# Patient Record
Sex: Male | Born: 1935
Health system: Southern US, Community
[De-identification: ages and names within clinical notes are randomized; demographics above are authoritative.]

## PROBLEM LIST (undated history)

## (undated) DIAGNOSIS — K589 Irritable bowel syndrome without diarrhea: Secondary | ICD-10-CM

## (undated) DIAGNOSIS — Z973 Presence of spectacles and contact lenses: Secondary | ICD-10-CM

## (undated) DIAGNOSIS — Z8679 Personal history of other diseases of the circulatory system: Secondary | ICD-10-CM

## (undated) DIAGNOSIS — Q600 Renal agenesis, unilateral: Secondary | ICD-10-CM

## (undated) DIAGNOSIS — M8580 Other specified disorders of bone density and structure, unspecified site: Secondary | ICD-10-CM

## (undated) DIAGNOSIS — Z923 Personal history of irradiation: Secondary | ICD-10-CM

## (undated) DIAGNOSIS — R300 Dysuria: Secondary | ICD-10-CM

## (undated) DIAGNOSIS — M1990 Unspecified osteoarthritis, unspecified site: Secondary | ICD-10-CM

## (undated) DIAGNOSIS — Z87438 Personal history of other diseases of male genital organs: Secondary | ICD-10-CM

## (undated) DIAGNOSIS — Z9889 Other specified postprocedural states: Secondary | ICD-10-CM

## (undated) DIAGNOSIS — E785 Hyperlipidemia, unspecified: Secondary | ICD-10-CM

## (undated) DIAGNOSIS — I10 Essential (primary) hypertension: Secondary | ICD-10-CM

## (undated) DIAGNOSIS — C4490 Unspecified malignant neoplasm of skin, unspecified: Secondary | ICD-10-CM

## (undated) DIAGNOSIS — Z85828 Personal history of other malignant neoplasm of skin: Secondary | ICD-10-CM

## (undated) DIAGNOSIS — C679 Malignant neoplasm of bladder, unspecified: Secondary | ICD-10-CM

## (undated) DIAGNOSIS — K219 Gastro-esophageal reflux disease without esophagitis: Secondary | ICD-10-CM

## (undated) DIAGNOSIS — Z9221 Personal history of antineoplastic chemotherapy: Secondary | ICD-10-CM

## (undated) DIAGNOSIS — IMO0002 Reserved for concepts with insufficient information to code with codable children: Secondary | ICD-10-CM

## (undated) DIAGNOSIS — Z87442 Personal history of urinary calculi: Secondary | ICD-10-CM

## (undated) DIAGNOSIS — Z859 Personal history of malignant neoplasm, unspecified: Secondary | ICD-10-CM

## (undated) HISTORY — PX: OTHER SURGICAL HISTORY: SHX169

## (undated) HISTORY — DX: Gastro-esophageal reflux disease without esophagitis: K21.9

## (undated) HISTORY — DX: Hyperlipidemia, unspecified: E78.5

## (undated) HISTORY — DX: Irritable bowel syndrome, unspecified: K58.9

---

## 2000-08-08 ENCOUNTER — Encounter: Payer: Self-pay | Admitting: Family Medicine

## 2000-08-08 ENCOUNTER — Encounter: Admission: RE | Admit: 2000-08-08 | Discharge: 2000-08-08 | Payer: Self-pay | Admitting: Family Medicine

## 2002-09-09 ENCOUNTER — Emergency Department (HOSPITAL_COMMUNITY): Admission: EM | Admit: 2002-09-09 | Discharge: 2002-09-09 | Payer: Self-pay

## 2002-09-13 ENCOUNTER — Emergency Department (HOSPITAL_COMMUNITY): Admission: EM | Admit: 2002-09-13 | Discharge: 2002-09-13 | Payer: Self-pay | Admitting: Emergency Medicine

## 2002-12-05 ENCOUNTER — Encounter (INDEPENDENT_AMBULATORY_CARE_PROVIDER_SITE_OTHER): Payer: Self-pay

## 2002-12-05 ENCOUNTER — Ambulatory Visit (HOSPITAL_COMMUNITY): Admission: RE | Admit: 2002-12-05 | Discharge: 2002-12-05 | Payer: Self-pay | Admitting: Gastroenterology

## 2005-05-30 ENCOUNTER — Ambulatory Visit (HOSPITAL_COMMUNITY): Admission: RE | Admit: 2005-05-30 | Discharge: 2005-05-30 | Payer: Self-pay | Admitting: General Surgery

## 2005-05-30 ENCOUNTER — Encounter (INDEPENDENT_AMBULATORY_CARE_PROVIDER_SITE_OTHER): Payer: Self-pay | Admitting: Specialist

## 2005-05-30 HISTORY — PX: HEMORROIDECTOMY: SUR656

## 2005-05-31 ENCOUNTER — Emergency Department (HOSPITAL_COMMUNITY): Admission: EM | Admit: 2005-05-31 | Discharge: 2005-05-31 | Payer: Self-pay | Admitting: Emergency Medicine

## 2006-04-24 ENCOUNTER — Ambulatory Visit (HOSPITAL_COMMUNITY): Admission: RE | Admit: 2006-04-24 | Discharge: 2006-04-24 | Payer: Self-pay | Admitting: General Surgery

## 2007-03-27 ENCOUNTER — Encounter: Admission: RE | Admit: 2007-03-27 | Discharge: 2007-03-27 | Payer: Self-pay | Admitting: Family Medicine

## 2008-04-04 ENCOUNTER — Ambulatory Visit (HOSPITAL_BASED_OUTPATIENT_CLINIC_OR_DEPARTMENT_OTHER): Admission: RE | Admit: 2008-04-04 | Discharge: 2008-04-04 | Payer: Self-pay | Admitting: Urology

## 2009-03-18 ENCOUNTER — Encounter: Admission: RE | Admit: 2009-03-18 | Discharge: 2009-03-18 | Payer: Self-pay | Admitting: Family Medicine

## 2009-04-16 ENCOUNTER — Ambulatory Visit (HOSPITAL_COMMUNITY): Admission: RE | Admit: 2009-04-16 | Discharge: 2009-04-16 | Payer: Self-pay | Admitting: Gastroenterology

## 2009-09-02 ENCOUNTER — Ambulatory Visit: Payer: Self-pay | Admitting: Sports Medicine

## 2009-09-02 DIAGNOSIS — M216X9 Other acquired deformities of unspecified foot: Secondary | ICD-10-CM

## 2009-09-02 DIAGNOSIS — M629 Disorder of muscle, unspecified: Secondary | ICD-10-CM

## 2010-02-09 ENCOUNTER — Ambulatory Visit: Payer: Self-pay | Admitting: Sports Medicine

## 2010-02-09 ENCOUNTER — Encounter: Admission: RE | Admit: 2010-02-09 | Discharge: 2010-02-09 | Payer: Self-pay | Admitting: Sports Medicine

## 2010-02-09 DIAGNOSIS — M25559 Pain in unspecified hip: Secondary | ICD-10-CM

## 2010-03-03 ENCOUNTER — Ambulatory Visit: Payer: Self-pay | Admitting: Sports Medicine

## 2010-03-03 DIAGNOSIS — R269 Unspecified abnormalities of gait and mobility: Secondary | ICD-10-CM | POA: Insufficient documentation

## 2010-05-03 ENCOUNTER — Ambulatory Visit
Admission: RE | Admit: 2010-05-03 | Discharge: 2010-05-03 | Payer: Self-pay | Source: Home / Self Care | Attending: Sports Medicine | Admitting: Sports Medicine

## 2010-05-27 NOTE — Assessment & Plan Note (Signed)
Summary: ORTHOTICS/LP   Vital Signs:  Patient profile:   77 year old male BP sitting:   146 / 81  History of Present Illness: 77 yo M f/u R hip pain.  States hip doing much better after doing his hip strength exercises and IT band stretches.  Gotten back to running and doing fine.  Used mobic for  ~1 month then stopped, thinks it did help.  Still just some pain with golf swing.  Pain is still lateral going down to knee.  Also would like to have orthotics made today. Has used some hard orthotics for years, but would like some custom soft orthotics.  Not having too much toe pain.  Has Rt 2nd toe overlapping great toe, better with toe spacer he bought.  Allergies: No Known Drug Allergies  Physical Exam  General:  Well-developed,well-nourished,in no acute distress; alert,appropriate and cooperative throughout examination Msk:  Rt hip: FROM without pain, good strength with IR/ER/abd.  Minimal ttp over greater troch.  Feet: b/l pes planus with transv arch breakdown.  Rt foot with 2nd toe overlapping great toe.  B/l feet show collapsed MT heads diffusely but no ttp or abnl callus.  Leg lengths equal  Gait: mild b/l ankle pronation   Impression & Recommendations:  Problem # 1:  HIP PAIN, RIGHT (ICD-719.45) Has mild OA on xray, but exam really appears to be more muscular or troch bursa/ITB related - continue hip strength exercises - gradually increase back to golf game - f/u prn  His updated medication list for this problem includes:    Meloxicam 15 Mg Tabs (Meloxicam) .Marland Kitchen... 1 by mouth daily as needed for hip pain  Problem # 2:  ABNORMALITY OF GAIT (ICD-781.2)  b/l ankle pronation and transv arch breakdown  Patient was fitted for a : standard, cushioned, semi-rigid orthotic. The orthotic was heated and afterward the patient stood on the orthotic blank positioned on the orthotic stand. The patient was positioned in subtalar neutral position and 10 degrees of ankle dorsiflexion in a  weight bearing stance. After completion of molding, a stable base was applied to the orthotic blank. The blank was ground to a stable position for weight bearing. Size: 11 (red) Base: EVA Posting: MT pad for Rt foot  tried prior to leaving with good correction in overpronation.  orthotics comfortable  f/u prn  Orders: Orthotic Materials, each unit ([id number])  Complete Medication List: 1)  Omeprazole 20 Mg Tbec (Omeprazole) .... Take 1 tablet by mouth two times a day 2)  Losartan Potassium-hctz 100-25 Mg Tabs (Losartan potassium-hctz) .... Take 1 tablet by mouth once daily 3)  Eql Fish Oil 1000 Mg Caps (Omega-3 fatty acids) .... Take 2 tablets by mouth once daily 4)  Glucosamine-chondroitin Caps (Glucosamine-chondroit-vit c-mn) .... Take 1 tablet by mouth once daily 5)  Meloxicam 15 Mg Tabs (Meloxicam) .Marland Kitchen.. 1 by mouth daily as needed for hip pain   Orders Added: 1)  Est. Patient Level III [16109] 2)  Orthotic Materials, each unit [L3002]

## 2010-05-27 NOTE — Assessment & Plan Note (Signed)
Summary: F/U,MC   Vital Signs:  Patient profile:   77 year old male Pulse rate:   76 / minute BP sitting:   103 / 72  Vitals Entered By: Lillia Pauls CMA (May 03, 2010 9:10 AM)   History of Present Illness: pt here to f/u hip pain and orthotics, which he says are about 75% better.  pt is now running an hour at a time at this pt without discomfort . pt informed me that he also changed his cholesterol med to zetia 10mg  which he also attributes to him feeling better.  he has done exercises and feels stronger  he  feels a lot of cushionin new orthotics  Allergies: No Known Drug Allergies  Physical Exam  General:  Well-developed,well-nourished,in no acute distress; alert,appropriate and cooperative throughout examination Msk:  Hip flexors strong bilat Hip rotation strong bilat FABER on rt improved FABER on left improved, but still tight IR and ER good bilat Lt hip abduction strong improved, but weaker than rt  Rt hip abduction strong  Running gait fairly neurtral, push off on left not as strong as rt    Impression & Recommendations:  Problem # 1:  HIP PAIN, RIGHT (ICD-719.45)  His updated medication list for this problem includes:    Meloxicam 15 Mg Tabs (Meloxicam) .Marland Kitchen... 1 by mouth daily as needed for hip pain  This is resolving  keep up exercises cans top meds unless pain requires them  as needed reck  Problem # 2:  ABNORMALITY OF GAIT (ICD-781.2) this seems well corrected in orthotics  cont using these  Complete Medication List: 1)  Omeprazole 20 Mg Tbec (Omeprazole) .... Take 1 tablet by mouth two times a day 2)  Losartan Potassium-hctz 100-25 Mg Tabs (Losartan potassium-hctz) .... Take 1 tablet by mouth once daily 3)  Eql Fish Oil 1000 Mg Caps (Omega-3 fatty acids) .... Take 2 tablets by mouth once daily 4)  Glucosamine-chondroitin Caps (Glucosamine-chondroit-vit c-mn) .... Take 1 tablet by mouth once daily 5)  Meloxicam 15 Mg Tabs (Meloxicam) .Marland Kitchen.. 1  by mouth daily as needed for hip pain 6)  Zetia 10 Mg Tabs (Ezetimibe) .... Take one tab qd  Patient Instructions: 1)  Continue with hip exercises, adding the step up exercises from the hip exercise sheet.  2)  Return for follow up as needed.    Orders Added: 1)  Est. Patient Level III [34742]

## 2010-05-27 NOTE — Assessment & Plan Note (Signed)
Summary: L HIP PAIN X 2 MOS   Vital Signs:  Patient profile:   77 year old male Height:      70 inches Weight:      140 pounds BMI:     20.16 BP sitting:   136 / 76  Vitals Entered By: Lillia Pauls CMA (Sep 02, 2009 10:09 AM)  History of Present Illness: Ran two 5K races several weeks ago. Successfully completed races. Left hip pain started a few days after 2nd 5K run. Pain along lateral left hip radiating along ITB to area just above knee. No swelling or paresthesias. No prior left hip injuries or procedures. Pain was worst on prolonged running and relieved by rest. Stopped running. Now biking outdoors without pain or difficulty. No resting pain. Would like to resume running.  Physical Exam  General:  Well-developed,well-nourished,in no acute distress; alert,appropriate and cooperative throughout examination Msk:  HIPS: Decreased (4/5) left abd/ER strength. Mild dyscomfort to palpation over ITB extending 5 cm above lateral knee joint. No swelling.  Pain reproduced on "pretzel" stretch.  KNEES/ANKLES/TOES: Full ROM/strength. Pronation with mild-mod long arch collapse on standing.  RUNNING FORM: Foot pronation (L>R) with 3/4 length orthotics. Overall good running for demographic profile.   Impression & Recommendations:  Problem # 1:  ITBS, LEFT KNEE (ICD-728.89)  - Daily ITB stretches. Exercises demonstrated to patient. Respective handout provided. - Daily hip abd/ER exercises (120 reps each). Exercises demonstrated to patient. - Pidgeon toe walking on level surfaces and up stairs daily. - RTC in 6 wks.  Problem # 2:  OTHER ACQUIRED DEFORMITY OF ANKLE AND FOOT OTHER (ICD-736.79)  - Continue current orthotics as patient is comfortable with them and they are in good condition on inspection. - Pidgeon toe exercises as noted above.

## 2010-05-27 NOTE — Assessment & Plan Note (Signed)
Summary: RT HIP PAIN,MC   Vital Signs:  Patient profile:   77 year old male Pulse rate:   76 / minute BP sitting:   136 / 90  (right arm)  Vitals Entered By: Rochele Pages RN (February 09, 2010 9:08 AM) CC: f/u L hip pain, new rt hip pain   CC:  f/u L hip pain and new rt hip pain.  History of Present Illness: Patient presents to clinic today to for evaluation of rt hip pain which he has experienced for approx 6 weeks. States the pain is gradually worsening.  Pain starts in lateral rt hip area and comes down the leg and ends around the lateral rt knee, occassionally he states the pain goes all the way down to his rt ankle.  Pain occurs with walking long distances greater than 1-2 miles, and running, does not occur with riding bike.  He rides his bike 10 miles daily.  Also states sitting aggrivates the hip pain.  He has done therapeutice exercises on  his rt hip 2-3 times daily.  Also tried celebrex twice which he states helped to alleviate the pain.    Current Problems (verified): 1)  Hip Pain, Right  (ICD-719.45) 2)  Other Acquired Deformity of Ankle and Foot Other  (ICD-736.79) 3)  Itbs, Left Knee  (ICD-728.89)  Current Medications (verified): 1)  Omeprazole 20 Mg Tbec (Omeprazole) .... Take 1 Tablet By Mouth Two Times A Day 2)  Losartan Potassium-Hctz 100-25 Mg Tabs (Losartan Potassium-Hctz) .... Take 1 Tablet By Mouth Once Daily 3)  Eql Fish Oil 1000 Mg Caps (Omega-3 Fatty Acids) .... Take 2 Tablets By Mouth Once Daily 4)  Glucosamine-Chondroitin  Caps (Glucosamine-Chondroit-Vit C-Mn) .... Take 1 Tablet By Mouth Once Daily 5)  Meloxicam 15 Mg Tabs (Meloxicam) .Marland Kitchen.. 1 By Mouth Daily As Needed For Hip Pain  Allergies (verified): No Known Drug Allergies  Past History:  Social History: Last updated: 02/09/2010 Fit active male.  Golf, walking/running, cycling  Past Medical History: GERD HTN  Social History: Fit active male.  Golf, walking/running, cycling  Review of  Systems  The patient denies anorexia, fever, weight loss, chest pain, syncope, and abdominal pain.    Physical Exam  General:  VS noted.  Well NAD Msk:  Right hip: Normal appearing. No skin changes.  ROM: Normal left. Has 35 deg IR and 40 deg ER Right limited external rom to 25 deg and produces groin pain. Normal internal ROM at 30 deg. Strength: 4+/5 hip flexors and abductors BL. Straight and contralateral leg raise tests neg. FABER test neg BL. - however he is somewhat tight on both sides though symmetrical  Feet: Right 2nd toe overlaying great toe.  transverse arch collapse bilat some bunion change on RT   Impression & Recommendations:  Problem # 1:  HIP PAIN, RIGHT (ICD-719.45)  Concern for osteroarthritis. Plan for Mobic, as well as evaluation with pelvis and hip X-rays. Also gave patient abductor anf hip flexor stength exercises.  Will follow up in 6 weeks or sooner if needed.  At that time will fully evaluate feet. Gave toe spacer today.  red flags discussed today. Pt voices understanding.  His updated medication list for this problem includes:    Meloxicam 15 Mg Tabs (Meloxicam) .Marland Kitchen... 1 by mouth daily as needed for hip pain  Orders: Diagnostic X-Ray/Fluoroscopy (Diagnostic X-Ray/Flu)  review of Xray changes reveal only mild DJD will  focus on rehab of hip MM and also on correction of foot breakdown  Problem # 2:  OTHER ACQUIRED DEFORMITY OF ANKLE AND FOOT OTHER (ICD-736.79) trnaverse arch breakdown with dorsal displacemnt of 2nd toe on RT bunion change RT with limited ext and flex  Complete Medication List: 1)  Omeprazole 20 Mg Tbec (Omeprazole) .... Take 1 tablet by mouth two times a day 2)  Losartan Potassium-hctz 100-25 Mg Tabs (Losartan potassium-hctz) .... Take 1 tablet by mouth once daily 3)  Eql Fish Oil 1000 Mg Caps (Omega-3 fatty acids) .... Take 2 tablets by mouth once daily 4)  Glucosamine-chondroitin Caps (Glucosamine-chondroit-vit c-mn) .... Take 1  tablet by mouth once daily 5)  Meloxicam 15 Mg Tabs (Meloxicam) .Marland Kitchen.. 1 by mouth daily as needed for hip pain  Patient Instructions: 1)  Thank you for coming today. 2)  We will schedule a hip X-ray today. 3)  Start the mobic daily. If you have stomach problems let us know and we can go to celebrex. 4)  Try the toe spacers for 6 weeks.  5)  Make a follow up in 6 week for your hip and feet.  Prescriptions: MELOXICAM 15 MG TABS (MELOXICAM) 1 by mouth daily as needed for hip pain  #30 x 1   Entered by:   Clementeen Graham MD   Authorized by:   Enid Baas MD   Signed by:   Clementeen Graham MD on 02/09/2010   Method used:   Electronically to        [facility]. [phone]* (retail)       [address]       [place], [place]  [address]       Ph: [phone]       Fax: [phone]   RxID:   [id number]    Orders Added: 1)  Diagnostic X-Ray/Fluoroscopy [Diagnostic X-Ray/Flu] 2)  Est. Patient Level III [44010]

## 2010-08-30 ENCOUNTER — Other Ambulatory Visit: Payer: Self-pay | Admitting: Sports Medicine

## 2010-09-07 NOTE — Op Note (Signed)
[name], Douglas Wood NO.:  [id number]   MEDICAL RECORD NO.:  [id number]          PATIENT TYPE:  AMB   LOCATION:  [facility]                         FACILITY:  [facility]   PHYSICIAN:  Maretta Bees. Vonita Moss, M.D.DATE OF BIRTH:  09/18/1931   DATE OF PROCEDURE:  04/04/2008  DATE OF DISCHARGE:                               OPERATIVE REPORT   PREOPERATIVE DIAGNOSIS:  Right ureteral stone, right hydronephrosis and  acute renal failure and a solitary right kidney.   POSTOPERATIVE DIAGNOSIS:  Right ureteral stone, right hydronephrosis and  acute renal failure and a solitary right kidney.   PROCEDURE:  Cystoscopy, right ureteroscopy and stone basketing, right  retrograde pyelogram with interpretation and insertion of right double-J  catheter.   SURGEON:  Larey Dresser, MD   ANESTHESIA:  General.   INDICATIONS FOR PROCEDURE:  Douglas Wood has a known solitary right kidney and  developed right flank pain and urgency to void.  A CT scan showed a 4 mm  stone in the distal right ureter at the UVJ with high-grade obstruction  and a creatinine of 4.4.  He is brought to the OR today emergently.   PROCEDURE IN DETAIL:  The patient is brought to the operating room and  placed in lithotomy position.  External genitalia were prepped and  draped in the usual fashion.  The urethral meatus was dilated to 28  [place] and the cystoscope was inserted and the anterior urethra was  normal.  The prostate had partial obstruction.  The bladder was  trabeculated and the right ureteral orifice was swollen, edematous and  erythematous.  Fortunately I was able to pass a guidewire with the inner  core retracted beyond the stone without difficulty and then dilated the  intramural ureter with the inner sheath of a ureteral access dilating  sheath.  I then inserted a 6 French rigid ureteroscope and was able to  basket a 3-4 mm dark stone and retrieve and give later to the patient.  I then reinserted the ureteroscope  and saw no other stones.  With his  acute renal failure I felt it was very important to leave up a double-J  catheter.   Right retrograde pyelogram was obtained using an open-ended ureteral  catheter and demonstrated a dilated ureter and a dilated pyelocalyceal  system.   I then inserted a 6 French 26 cm double-J catheter with a full coil in  the renal pelvis and a full coil in the bladder and the string brought  out per urethra and taped to the penis.  He was then taken to the  recovery room in good condition with negligible blood loss and having  tolerated the procedure well.     Maretta Bees. Vonita Moss, M.D.  Electronically Signed    LJP/MEDQ  D:  04/04/2008  T:  04/05/2008  Job:  [id number]

## 2010-09-10 NOTE — Op Note (Signed)
Douglas Wood, Douglas Wood                           ACCOUNT NO.:  [id number]   MEDICAL RECORD NO.:  [id number]                   PATIENT TYPE:  AMB   LOCATION:  ENDO                                 FACILITY:  [facility]   PHYSICIAN:  Danise Edge, M.D.                DATE OF BIRTH:  30-Sep-1931   DATE OF PROCEDURE:  12/05/2002  DATE OF DISCHARGE:                                 OPERATIVE REPORT   PROCEDURE PERFORMED:  Esophagogastroduodenoscopy, colonoscopy and rectal  polypectomy.   ENDOSCOPIST:  Charolett Bumpers, M.D.   INDICATIONS FOR PROCEDURE:  Mr. Douglas Wood is a 77 year old male born  [birth year].  Mr. Douglas Wood is scheduled to undergo a screening colonoscopy  with polypectomy to prevent colon cancer.  He has had ulcer type dyspeptic  symptoms while on aspirin.  I placed him on a proton pump inhibitor and  stopped his aspirin.  He has intermittent nausea without vomiting or  gastrointestinal bleeding.   PREMEDICATION:  Versed 10 mg, Demerol 50 mg.   PROCEDURE:  Diagnostic esophagogastroduodenoscopy.  After obtaining informed  consent, Mr. Douglas Wood was placed in the left lateral decubitus position.  I  administered intravenous Demerol and intravenous Versed to achieve conscious  sedation for the procedure.  The patient's blood pressure, oxygen  saturations and cardiac rhythm were monitored throughout the procedure and  documented in the medical record.   The Olympus gastroscope was passed through the posterior hypopharynx into  the proximal esophagus without difficulty.  The hypopharynx, larynx, and  vocal cords appeared normal.   Esophagoscopy:  The proximal, mid and lower segments of the esophagus  appeared normal.   Gastroscopy:  Retroflex view of the gastric cardia and fundus was normal.  The gastric body, antrum and pylorus appeared normal.   Duodenoscopy:  The duodenal bulb, mid duodenum and distal duodenum appeared  normal.   ASSESSMENT:  Normal  esophagogastroduodenoscopy.   PROCEDURE:  Proctocolonoscopy to the cecum.  Anal inspection was normal.  Digital rectal exam was normal.  The Olympus adjustable pediatric  colonoscope was introduced into the rectum and easily advanced to the cecum.  Colonic preparation for the exam today was excellent.   Rectum:  From the distal rectum, a 3 mm submucosal type polyp was removed  with electrocautery snare and submitted for pathologic interpretation.   Sigmoid colon and descending colon:  Scattered colonic diverticulosis.   Splenic flexure:  Normal.   Transverse colon:  Normal.   Hepatic flexure:  Normal.   Ascending colon:  Normal.   Cecum and ileocecal valve:  Normal.   ASSESSMENT:  Proctocolonoscopy to the cecum resulted in the removal of a  small submucosal appearing polyp from the distal rectum which was submitted  for pathologic interpretation.  No endoscopic evidence for the presence of  colorectal neoplasia.  Danise Edge, M.D.    MJ/MEDQ  D:  12/05/2002  T:  12/05/2002  Job:  [id number]   cc:   Donia Guiles, M.D.  [address] [place]  [place] [address]  Fax: [phone]

## 2010-09-10 NOTE — Op Note (Signed)
Douglas Wood, MACPHERSON NO.:  [id number]   MEDICAL RECORD NO.:  [id number]          PATIENT TYPE:  AMB   LOCATION:  DAY                          FACILITY:  [facility]   PHYSICIAN:  Ollen Gross. Vernell Morgans, M.D. DATE OF BIRTH:  02/14/1932   DATE OF PROCEDURE:  04/24/2006  DATE OF DISCHARGE:                               OPERATIVE REPORT   PREOPERATIVE DIAGNOSES:  Rectal pain.   POSTOPERATIVE DIAGNOSIS:  Small posterior right rectal tear.   PROCEDURE:  Exam under anesthesia, anoscopy and fulguration of rectal  tear.   SURGEON:  Ollen Gross. Vernell Morgans, M.D.   ANESTHESIA:  General via LMA.   PROCEDURE:  After informed consent was obtained, the patient was brought  to the operating room and placed in supine position on the operating  room table.  After induction of general anesthesia, the patient was  moved into the lithotomy position.  His perirectal area was prepped with  Betadine and draped in usual sterile manner.  The patient's rectum was  then probed with finger and K-Y jelly.  The patient had a very loose  rectum.  Once two fingers were able to be inserted the rectum, the  bullet retractor was then placed in the rectum without difficulty.  It  was noted on the just external examination of the skin that it appeared  as though the patient had a very small, very shallow posterior right  rectal tear.  This was noted before any probing of the rectum.  Once the  bullet was inserted the tear was also noted.  The rest of the rectum was  examined circumferentially and appeared to be normal.  The area of the  staple line from the patient's previous BPH almost and 10 months ago or  so was noted and looked healthy and normal.  The patient had one little  pit in the skin in the right anterior region and this was probed with  the smallest tear duct probe and it did not track anywhere.  It was just  a fold of the skin.  The perirectal region was then infiltrated with  0.25% Marcaine with  epinephrine, especially posterior around this little  small area of skin tear and the tear was then fulgurated gently with  electrocautery.  The area was then coated with dibucaine ointment and a  small piece of Gelfoam and sterile dressings were applied.  The patient  tolerated the procedure well.  At the end of the case, all needle,  sponge and instrument counts were correct.  The patient was then  awakened and taken to recovery in stable condition.      Ollen Gross. Vernell Morgans, M.D.  Electronically Signed     PST/MEDQ  D:  04/24/2006  T:  04/24/2006  Job:  [id number]

## 2010-09-10 NOTE — Op Note (Signed)
Douglas Wood, Douglas Wood NO.:  [id number]   MEDICAL RECORD NO.:  [id number]          PATIENT TYPE:  AMB   LOCATION:  DAY                          FACILITY:  [facility]   PHYSICIAN:  Ollen Gross. Vernell Morgans, M.D. DATE OF BIRTH:  [birth year]   DATE OF PROCEDURE:  05/30/2005  DATE OF DISCHARGE:                                 OPERATIVE REPORT   PREOPERATIVE DIAGNOSIS:  Prolapsing internal hemorrhoids.   POSTOPERATIVE DIAGNOSIS:  Prolapsing internal hemorrhoids.   PROCEDURE:  PPH hemorrhoidectomy.   SURGEON:  Ollen Gross. Carolynne Edouard, M.D.   ANESTHESIA:  General endotracheal.   DESCRIPTION OF PROCEDURE:  After informed consent was obtained, the patient  was brought to the operating room, left in the supine position on the  stretcher and after adequate induction of general anesthesia, the patient  was moved into a prone position on the operating room table and all pressure  points were padded and the patient was placed in a sort of jackknife  position and the buttocks were retracted laterally with tape. The perirectal  region was then prepped with Betadine and draped in the usual sterile  manner. The perirectal region was then infiltrated with 0.25% Marcaine with  epinephrine and 1 mL of Wydase and the tissue was massaged gently for  several minutes. A smaller bullet type retractor was then placed in the  rectum. The patient was noted to have what appeared to be some mild to  moderate internal hemorrhoidal tissue. No external disease was identified.  Next, a deep Fansler retractor was placed in the rectum. A 2-0 Prolene  pursestring stitch was then placed approximately 4 cm deep to the dentate  line in the mucosa and submucosa circumferentially around the inside of the  rectum. Care was taken to make sure only mucosa and submucosa were gathered  up by the stitch and also that each throw of the stitch began where the  previous stitch ended. Once the Prolene stitch was in place and in good  position, the Fansler retractor was removed. The ends of the pursestring  stitch were brought through a clear plastic retractor and white dilator and  the white dilator and retractor were placed in the rectum. The white dilator  was then removed. The PPH stapling device was then placed in the rectum and  felt to fall beneath the pursestring stitch. The pursestring stitch was then  cinched down around the anvil of the stapling device. At this point, it  became very apparent that internal hemorrhoids were very prominent. The  tails of the Prolene stitch were then brought out through the side holes of  the stapling device, an air knot and a hemostat were then applied and gentle  traction on the ends of the Prolene was applied while the stapling device  was closed all the way. Once the stapling device was closed all the way, a  minute was allowed to pass, the stapling device was then fired, another  minute was allowed to pass and the stapling device was opened a half turn  and then removed from the rectum. The  specimen was then examined and there  was a complete donut of mucosa and submucosa. No muscle fibers were  identified and the specimen was very uniform in width and length. It was  then sent to pathology for further evaluation. The line was then examined  with a smaller Fansler retractor and a couple of small bleeding points on  the staple line were controlled with figure-of-eight 4-0 Vicryl stitches.  The staple line was then watched for several minutes and appeared to be  completely hemostatic. The perirectal area was then infiltrated with 0.25%  Marcaine with epinephrine and 1% lidocaine jelly was placed inside the  rectum along with a  piece of Gelfoam. Sterile dressings were applied. The patient tolerated the  procedure well. At the end of the case, all needle, sponge, and instrument  counts were correct. The patient was then moved back into the supine  position on the stretcher,  awakened and taken to recovery in stable  condition.      Ollen Gross. Vernell Morgans, M.D.  Electronically Signed     PST/MEDQ  D:  05/30/2005  T:  05/30/2005  Job:  [id number]

## 2010-11-24 ENCOUNTER — Other Ambulatory Visit: Payer: Self-pay | Admitting: Dermatology

## 2011-01-28 LAB — POCT I-STAT 4, (NA,K, GLUC, HGB,HCT)
HCT: 45 % (ref 39.0–52.0)
Hemoglobin: 15.3 g/dL (ref 13.0–17.0)
Potassium: 4.4 mEq/L (ref 3.5–5.1)
Sodium: 134 mEq/L — ABNORMAL LOW (ref 135–145)

## 2011-08-19 ENCOUNTER — Other Ambulatory Visit: Payer: Self-pay | Admitting: Dermatology

## 2011-10-18 ENCOUNTER — Other Ambulatory Visit: Payer: Self-pay | Admitting: Dermatology

## 2012-06-25 ENCOUNTER — Other Ambulatory Visit: Payer: Self-pay | Admitting: Dermatology

## 2012-10-12 ENCOUNTER — Encounter: Payer: Self-pay | Admitting: Family Medicine

## 2012-10-12 ENCOUNTER — Ambulatory Visit (INDEPENDENT_AMBULATORY_CARE_PROVIDER_SITE_OTHER): Payer: Medicare Other | Admitting: Family Medicine

## 2012-10-12 VITALS — BP 130/80 | Ht 70.0 in | Wt 145.0 lb

## 2012-10-12 DIAGNOSIS — M79609 Pain in unspecified limb: Secondary | ICD-10-CM

## 2012-10-12 DIAGNOSIS — M79672 Pain in left foot: Secondary | ICD-10-CM

## 2012-10-12 NOTE — Patient Instructions (Addendum)
You have a peroneal tendon strain, metatarsal contusion. Aleve 1-2 tabs with food in the morning - can take in evening too if very painful. Ice outside of foot 15 minutes at a time 3-4 times a day. Can consider a hard soled shoe but I don't think this is necessary - continue wearing supportive shoes with your orthotics. Ok for all activities as long as not limping and pain is less than a 3 on a scale of 1-10. Wait about a week then start exercises (paint can exercise, theraband) 3 sets of 10 once a day for next 4-6 weeks. Follow up with me as needed.

## 2012-10-15 ENCOUNTER — Encounter: Payer: Self-pay | Admitting: Family Medicine

## 2012-10-15 DIAGNOSIS — M79672 Pain in left foot: Secondary | ICD-10-CM | POA: Insufficient documentation

## 2012-10-15 NOTE — Assessment & Plan Note (Signed)
2/2 contusion, peroneal tendon strain.  Pain does come and go which is reassuring as well.  Will start aleve, icing, continue using orthotics.  Would not add hard soled shoe at this time.  Activities as tolerated.  Shown home strengthening exercises for peroneal tendon as well.  F/u prn.

## 2012-10-15 NOTE — Progress Notes (Signed)
Patient ID: BOYD BUFFALO III, male   DOB: November 23, 1931, 79 y.o.   MRN: [id number]  PCP: No primary provider on file.  Subjective:   HPI: Patient is a 79 y.o. male here for left foot pain.  Patient reports about 6 weeks ago he started having lateral left foot pain. Had been jogging up until that point. Thinks he may have done this when golfing - putting too much pressure on left foot on follow through. No swelling or bruising. No prior injuries. Pain has been off and on since that time. Has orthotics. Icing, taking celebrex, resting from running. He had recently changed his golf swing as well.  Past Medical History  Diagnosis Date  . IBS (irritable bowel syndrome)   . Hyperlipidemia   . GERD (gastroesophageal reflux disease)     Current Outpatient Prescriptions on File Prior to Visit  Medication Sig Dispense Refill  . meloxicam (MOBIC) 15 MG tablet take 1 tablet by mouth once daily if needed for HIP PAIN  30 tablet  1   No current facility-administered medications on file prior to visit.    Past Surgical History  Procedure Laterality Date  . Hemorroidectomy    . Skin cancer excision      Allergies  Allergen Reactions  . Levaquin (Levofloxacin In D5w)   . Statins     History   Social History  . Marital Status: Married    Spouse Name: N/A    Number of Children: N/A  . Years of Education: N/A   Occupational History  . Not on file.   Social History Main Topics  . Smoking status: Never Smoker   . Smokeless tobacco: Not on file  . Alcohol Use: Not on file  . Drug Use: Not on file  . Sexually Active: Not on file   Other Topics Concern  . Not on file   Social History Narrative  . No narrative on file    No family history on file.  BP 130/80  Ht 5\' 10"  (1.778 m)  Wt 145 lb (65.772 kg)  BMI 20.81 kg/m2  Review of Systems: See HPI above.    Objective:  Physical Exam:  Gen: NAD  L foot/ankle: No gross deformity, swelling, ecchymoses FROM ankle  with 5/5 strength, minimal pain ext rotation. TTP proximal 5th metatarsal and just proximal to this in peroneal tendon area, very mild. Negative ant drawer and talar tilt.   Negative syndesmotic compression. Negative metatarsal squeeze. Thompsons test negative. NV intact distally.  MSK u/s L foot - no cortical irregularity, edema overlying cortex, or neovascularity to suggest fracture, stress fracture.    Assessment & Plan:  1. Left foot pain - 2/2 contusion, peroneal tendon strain.  Pain does come and go which is reassuring as well.  Will start aleve, icing, continue using orthotics.  Would not add hard soled shoe at this time.  Activities as tolerated.  Shown home strengthening exercises for peroneal tendon as well.  F/u prn.

## 2012-11-14 ENCOUNTER — Encounter (HOSPITAL_BASED_OUTPATIENT_CLINIC_OR_DEPARTMENT_OTHER): Payer: Self-pay | Admitting: Family Medicine

## 2012-11-14 ENCOUNTER — Emergency Department (HOSPITAL_COMMUNITY)
Admission: EM | Admit: 2012-11-14 | Discharge: 2012-11-14 | Disposition: A | Discharge status: Home / Self Care | Payer: Medicare Other | Source: Home / Self Care | Attending: Emergency Medicine | Admitting: Emergency Medicine

## 2012-11-14 ENCOUNTER — Emergency Department (HOSPITAL_BASED_OUTPATIENT_CLINIC_OR_DEPARTMENT_OTHER)
Admission: EM | Admit: 2012-11-14 | Discharge: 2012-11-14 | Disposition: A | Discharge status: Home / Self Care | Payer: Medicare Other | Attending: Emergency Medicine | Admitting: Emergency Medicine

## 2012-11-14 ENCOUNTER — Encounter (HOSPITAL_COMMUNITY): Payer: Self-pay | Admitting: Emergency Medicine

## 2012-11-14 DIAGNOSIS — Y939 Activity, unspecified: Secondary | ICD-10-CM | POA: Insufficient documentation

## 2012-11-14 DIAGNOSIS — Z7982 Long term (current) use of aspirin: Secondary | ICD-10-CM | POA: Insufficient documentation

## 2012-11-14 DIAGNOSIS — Z862 Personal history of diseases of the blood and blood-forming organs and certain disorders involving the immune mechanism: Secondary | ICD-10-CM | POA: Insufficient documentation

## 2012-11-14 DIAGNOSIS — K219 Gastro-esophageal reflux disease without esophagitis: Secondary | ICD-10-CM | POA: Insufficient documentation

## 2012-11-14 DIAGNOSIS — Z23 Encounter for immunization: Secondary | ICD-10-CM | POA: Insufficient documentation

## 2012-11-14 DIAGNOSIS — Z8639 Personal history of other endocrine, nutritional and metabolic disease: Secondary | ICD-10-CM | POA: Insufficient documentation

## 2012-11-14 DIAGNOSIS — Z79899 Other long term (current) drug therapy: Secondary | ICD-10-CM | POA: Insufficient documentation

## 2012-11-14 DIAGNOSIS — Z203 Contact with and (suspected) exposure to rabies: Secondary | ICD-10-CM

## 2012-11-14 DIAGNOSIS — Z8719 Personal history of other diseases of the digestive system: Secondary | ICD-10-CM | POA: Insufficient documentation

## 2012-11-14 DIAGNOSIS — IMO0002 Reserved for concepts with insufficient information to code with codable children: Secondary | ICD-10-CM | POA: Insufficient documentation

## 2012-11-14 DIAGNOSIS — T07XXXA Unspecified multiple injuries, initial encounter: Secondary | ICD-10-CM

## 2012-11-14 DIAGNOSIS — W64XXXA Exposure to other animate mechanical forces, initial encounter: Secondary | ICD-10-CM | POA: Insufficient documentation

## 2012-11-14 DIAGNOSIS — Y92009 Unspecified place in unspecified non-institutional (private) residence as the place of occurrence of the external cause: Secondary | ICD-10-CM | POA: Insufficient documentation

## 2012-11-14 DIAGNOSIS — E785 Hyperlipidemia, unspecified: Secondary | ICD-10-CM | POA: Insufficient documentation

## 2012-11-14 MED ORDER — RABIES VACCINE, PCEC IM SUSR
1.0000 mL | Freq: Once | INTRAMUSCULAR | Status: AC
  Administered 2012-11-14: 1 mL via INTRAMUSCULAR
  Filled 2012-11-14: qty 1

## 2012-11-14 MED ORDER — RABIES IMMUNE GLOBULIN 150 UNIT/ML IM INJ
1400.0000 [IU] | INJECTION | Freq: Once | INTRAMUSCULAR | Status: AC
  Administered 2012-11-14: 1425 [IU] via INTRAMUSCULAR
  Filled 2012-11-14: qty 9.5

## 2012-11-14 NOTE — ED Notes (Signed)
Pt states that on July 8th, he found a fox den in his backyard.  States that his dog got into it and pt got scratched up.  States that he doesn't know if he got scratched up from brush, his dog, or foxes.  States that July 19, animal control found a sick fox in his neighborhood which he told was unrelated to his encounter.  Today, pt went to [facility] and was told that he was very low risk for rabies since no there was no bite and no confirmed rabies associated with the encounter and did not qualify for rabies vaccines. Pt here today for a second opinion.

## 2012-11-14 NOTE — ED Provider Notes (Addendum)
History    CSN: [id number] Arrival date & time 11/14/12  1044  First MD Initiated Contact with Patient 11/14/12 1134     Chief Complaint  Patient presents with  . scratched by fox    (Consider location/radiation/quality/duration/timing/severity/associated sxs/prior Treatment) HPI Comments: Patient presents with a possible rabies exposure. He states that on July 8 he was rescueing his dog from a fox den. He states that he has a [employer] in his backyard. He states that he did not actually come in contact with a Fox but that he did get some scratches on his arms and his legs which he says was from the woods. He states that 2 days ago he found out that there was a [place] that was found dead in his neighborhood and animal control is suspicious for rabies. He came in because he is not sure whether he needs rabies vaccines. His tetanus shot is up-to-date.  Past Medical History  Diagnosis Date  . IBS (irritable bowel syndrome)   . Hyperlipidemia   . GERD (gastroesophageal reflux disease)    Past Surgical History  Procedure Laterality Date  . Hemorroidectomy    . Skin cancer excision     No family history on file. History  Substance Use Topics  . Smoking status: Never Smoker   . Smokeless tobacco: Not on file  . Alcohol Use: Yes     Comment: nightly    Review of Systems  Constitutional: Negative for fever.  HENT: Negative for neck pain and neck stiffness.   Respiratory: Negative for shortness of breath.   Cardiovascular: Negative for chest pain.  Gastrointestinal: Negative for nausea and vomiting.  Skin: Positive for wound. Negative for rash.  Neurological: Negative for headaches.    Allergies  Levaquin and Statins  Home Medications   Current Outpatient Rx  Name  Route  Sig  Dispense  Refill  . aspirin 81 MG chewable tablet   Oral   Chew 81 mg by mouth daily.         . diazepam (VALIUM) 5 MG tablet   Oral   Take 5 mg by mouth at bedtime as needed for anxiety.         . fish oil-omega-3 fatty acids 1000 MG capsule   Oral   Take 2 g by mouth daily.         Marland Kitchen losartan-hydrochlorothiazide (HYZAAR) 50-12.5 MG per tablet   Oral   Take 1 tablet by mouth daily.         . meloxicam (MOBIC) 15 MG tablet      take 1 tablet by mouth once daily if needed for HIP PAIN   30 tablet   1   . omeprazole (PRILOSEC) 20 MG capsule   Oral   Take 40 mg by mouth daily.          There were no vitals taken for this visit. Physical Exam  Constitutional: He is oriented to person, place, and time. He appears well-developed and well-nourished.  Eyes: Pupils are equal, round, and reactive to light.  Cardiovascular: Normal rate.   Pulmonary/Chest: Effort normal.  Musculoskeletal: Normal range of motion.  Neurological: He is alert and oriented to person, place, and time.  Skin:  Patient has a small healing abrasion to his right leg. There is no signs of infection    ED Course  Procedures (including critical care time) Labs Reviewed - No data to display No results found. 1. Abrasions of multiple sites  MDM   patient is adamant that he did not come in contact with the Fox. He says that the scratches are from the brush in the woods.  His concern was if he can contract rabies from the fox den. Given that he has no bites or scratches that came from the [place] I do not feel that rabies treatment was indicated at this time.  Rolan Bucco, MD 11/14/12 1503  Rolan Bucco, MD 11/14/12 1510

## 2012-11-14 NOTE — Progress Notes (Signed)
Pt confirms pcp is Cam Hai at [facility] updated

## 2012-11-14 NOTE — ED Provider Notes (Signed)
History    This chart was scribed for non-physician practitioner Legion Discher Can Tenny Craw, working with Gilda Crease, * by Donne Anon, ED Scribe. This patient was seen in room WTR5/WTR5 and the patient's care was started at 1526.  CSN: [id number] Arrival date & time 11/14/12  1415  First MD Initiated Contact with Patient 11/14/12 1526     Chief Complaint  Patient presents with  . Possible Rabies Exposure     The history is provided by the patient and medical records. No language interpreter was used.   HPI Comments: Douglas Wood is a 79 y.o. male with hx of IBS, HLD, and GERD, who presents to the Emergency Department complaining of possible rabies exposure. He was seen by Dr. Fredderick Phenix at [facility] earlier today for the same complaint, where he was told that since there was no bite and no confirmed rabies associated with the encounter he did not qualify for a rabies vaccine. He would like a second opinion. He states that on July 8th he found a fox [place]achery Conch is his backyard. His dog got into the den, and when he went down to get his dog from the den and the patient received scratches. He states it was dark and it all happened so fast he is unsure if he saw a fox during the encounter or if it bit him. He is unsure if the scratches are from the brush when he was in the woods, his dog, or the fox. Upon chart review, this story differs from the story he told to Dr. Fredderick Phenix earlier today, where he adamantly told Dr. Fredderick Phenix that the scratches were from the brush. He further states that on July 19 animal control found a sick fox in his neighborhood, and he is concerned it may be rabid, although rabies was never definitively confirmed.   Past Medical History  Diagnosis Date  . IBS (irritable bowel syndrome)   . Hyperlipidemia   . GERD (gastroesophageal reflux disease)    Past Surgical History  Procedure Laterality Date  . Hemorroidectomy    . Skin cancer excision     No family history on  file. History  Substance Use Topics  . Smoking status: Never Smoker   . Smokeless tobacco: Not on file  . Alcohol Use: Yes     Comment: nightly    Review of Systems  Skin: Positive for wound.  All other systems reviewed and are negative.    Allergies  Levaquin and Statins  Home Medications   Current Outpatient Rx  Name  Route  Sig  Dispense  Refill  . aspirin 81 MG chewable tablet   Oral   Chew 81 mg by mouth daily.         . diazepam (VALIUM) 5 MG tablet   Oral   Take 5 mg by mouth at bedtime as needed for anxiety.         . fish oil-omega-3 fatty acids 1000 MG capsule   Oral   Take 2 g by mouth daily.         Marland Kitchen losartan-hydrochlorothiazide (HYZAAR) 50-12.5 MG per tablet   Oral   Take 1 tablet by mouth daily.         . meloxicam (MOBIC) 15 MG tablet      take 1 tablet by mouth once daily if needed for HIP PAIN   30 tablet   1   . omeprazole (PRILOSEC) 20 MG capsule   Oral   Take 40  mg by mouth daily.          BP 138/80  Pulse 71  Resp 16  SpO2 100%  Physical Exam  Nursing note and vitals reviewed. Constitutional: He appears well-developed and well-nourished. No distress.  HENT:  Head: Normocephalic and atraumatic.  Eyes: Conjunctivae are normal.  Neck: Neck supple. No tracheal deviation present.  Cardiovascular: Normal rate, regular rhythm and normal heart sounds.  Exam reveals no gallop and no friction rub.   No murmur heard. Pulmonary/Chest: Effort normal and breath sounds normal. No respiratory distress. He has no wheezes. He has no rales.  Musculoskeletal: Normal range of motion.  Neurological: He is alert.  Skin: Skin is warm and dry.  Small healing abrasion on right anterior lower leg.  Psychiatric: He has a normal mood and affect. His behavior is normal.    ED Course  Procedures (including critical care time) DIAGNOSTIC STUDIES: Oxygen Saturation is 100% on RA, normal by my interpretation.    COORDINATION OF CARE: 3:45 PM  Discussed treatment plan which includes rabies vaccine series with pt at bedside and pt agreed to plan.    Labs Reviewed - No data to display No results found. No diagnosis found.  MDM  Patient presents today requesting a rabies vaccine and immunoglobulin.  He was possibly scratched or bitten by a fox.  He is not sure if there was exposure, but states that there is a fox den behind his house and after he got his dog out of the area, he noticed scratches on his leg.  Patient given rabies immunoglobin and the first rabies vaccine in the ED.  Patient also given instructions for follow up vaccines.  No signs of infection at this time.  Tetanus is UTD.  I personally performed the services described in this documentation, which was scribed in my presence. The recorded information has been reviewed and is accurate.    Pascal Lux [place], PA-C 11/14/12 [phone]

## 2012-11-14 NOTE — ED Provider Notes (Signed)
Medical screening examination/treatment/procedure(s) were performed by non-physician practitioner and as supervising physician I was immediately available for consultation/collaboration.    Trelon Plush J. Taim Wurm, MD 11/14/12 2311 

## 2012-11-14 NOTE — ED Notes (Signed)
Pt sts he rescued his dog from foxes on July 8th and was scratched on arm and leg. Pt sts he is concerned that he was exposed to possible rapid fox.

## 2012-11-17 ENCOUNTER — Emergency Department (HOSPITAL_COMMUNITY)
Admission: EM | Admit: 2012-11-17 | Discharge: 2012-11-17 | Disposition: A | Discharge status: Home / Self Care | Payer: Medicare Other | Source: Home / Self Care

## 2012-11-17 DIAGNOSIS — Z203 Contact with and (suspected) exposure to rabies: Secondary | ICD-10-CM

## 2012-11-17 MED ORDER — RABIES VACCINE, PCEC IM SUSR
1.0000 mL | Freq: Once | INTRAMUSCULAR | Status: AC
  Administered 2012-11-17: 1 mL via INTRAMUSCULAR

## 2012-11-17 MED ORDER — RABIES VACCINE, PCEC IM SUSR
INTRAMUSCULAR | Status: AC
  Filled 2012-11-17: qty 1

## 2012-11-21 ENCOUNTER — Encounter (HOSPITAL_COMMUNITY): Payer: Self-pay | Admitting: *Deleted

## 2012-11-21 ENCOUNTER — Emergency Department (HOSPITAL_COMMUNITY)
Admission: EM | Admit: 2012-11-21 | Discharge: 2012-11-21 | Disposition: A | Discharge status: Home / Self Care | Payer: Medicare Other | Source: Home / Self Care

## 2012-11-21 DIAGNOSIS — Z203 Contact with and (suspected) exposure to rabies: Secondary | ICD-10-CM

## 2012-11-21 MED ORDER — RABIES VACCINE, PCEC IM SUSR
INTRAMUSCULAR | Status: AC
  Filled 2012-11-21: qty 1

## 2012-11-21 MED ORDER — RABIES VACCINE, PCEC IM SUSR
1.0000 mL | Freq: Once | INTRAMUSCULAR | Status: AC
  Administered 2012-11-21: 1 mL via INTRAMUSCULAR

## 2012-11-21 NOTE — ED Notes (Signed)
Pt returns for his rabies injection, he denies any problems.  This was for a fox exposure.

## 2012-11-28 ENCOUNTER — Encounter (HOSPITAL_COMMUNITY): Payer: Self-pay | Admitting: *Deleted

## 2012-11-28 ENCOUNTER — Emergency Department (HOSPITAL_COMMUNITY)
Admission: EM | Admit: 2012-11-28 | Discharge: 2012-11-28 | Disposition: A | Discharge status: Home / Self Care | Payer: Medicare Other | Source: Home / Self Care

## 2012-11-28 DIAGNOSIS — Z203 Contact with and (suspected) exposure to rabies: Secondary | ICD-10-CM

## 2012-11-28 MED ORDER — RABIES VACCINE, PCEC IM SUSR
INTRAMUSCULAR | Status: AC
  Filled 2012-11-28: qty 1

## 2012-11-28 MED ORDER — RABIES VACCINE, PCEC IM SUSR
1.0000 mL | Freq: Once | INTRAMUSCULAR | Status: AC
  Administered 2012-11-28: 1 mL via INTRAMUSCULAR

## 2012-11-28 NOTE — ED Notes (Signed)
Pt  Here  For  The  Last  Of  His  Rabies  Vaccine     He  Voices  No   Complaints

## 2013-01-30 ENCOUNTER — Other Ambulatory Visit: Payer: Self-pay | Admitting: Dermatology

## 2013-05-16 ENCOUNTER — Ambulatory Visit
Admission: RE | Admit: 2013-05-16 | Discharge: 2013-05-16 | Disposition: A | Discharge status: Home / Self Care | Payer: Medicare Other | Source: Ambulatory Visit | Attending: Family Medicine | Admitting: Family Medicine

## 2013-05-16 ENCOUNTER — Other Ambulatory Visit: Payer: Self-pay | Admitting: Family Medicine

## 2013-05-16 DIAGNOSIS — Z85820 Personal history of malignant melanoma of skin: Secondary | ICD-10-CM

## 2013-05-30 ENCOUNTER — Ambulatory Visit (INDEPENDENT_AMBULATORY_CARE_PROVIDER_SITE_OTHER): Payer: Medicare Other | Admitting: General Surgery

## 2013-05-30 ENCOUNTER — Encounter (INDEPENDENT_AMBULATORY_CARE_PROVIDER_SITE_OTHER): Payer: Self-pay | Admitting: General Surgery

## 2013-05-30 VITALS — BP 136/86 | HR 62 | Resp 16 | Ht 70.0 in | Wt 156.0 lb

## 2013-05-30 DIAGNOSIS — R599 Enlarged lymph nodes, unspecified: Secondary | ICD-10-CM

## 2013-05-30 DIAGNOSIS — R590 Localized enlarged lymph nodes: Secondary | ICD-10-CM | POA: Insufficient documentation

## 2013-05-30 NOTE — Progress Notes (Signed)
Patient ID: Douglas Wood, male   DOB: 1931/06/29, 80 y.o.   MRN: [id number]  Chief Complaint  Patient presents with  . Other    Eval lymh node bx    HPI Douglas Wood is a 80 y.o. male.  We're asked to see the patient in consultation by Dr. Serita Grammes to evaluate him for an enlarged lymph node of his left neck. The patient is an 80 year old white male who first noticed a lymph node in his left posterior cervical chain area about 3 months ago or so. He does not recall an upper respiratory infection although he has had some flulike symptoms at times. He denies any pain anywhere. He is actually in great shape for 80 years old and exercises regularly. The lymph node is nontender. He has been maintaining his weight.  HPI  Past Medical History  Diagnosis Date  . IBS (irritable bowel syndrome)   . Hyperlipidemia   . GERD (gastroesophageal reflux disease)     Past Surgical History  Procedure Laterality Date  . Hemorroidectomy    . Skin cancer excision      History reviewed. No pertinent family history.  Social History History  Substance Use Topics  . Smoking status: Never Smoker   . Smokeless tobacco: Not on file  . Alcohol Use: Yes     Comment: nightly    Allergies  Allergen Reactions  . Levaquin [Levofloxacin In D5w]   . Statins     Current Outpatient Prescriptions  Medication Sig Dispense Refill  . aspirin 81 MG chewable tablet Chew 81 mg by mouth daily.      . diazepam (VALIUM) 5 MG tablet Take 2.5-5 mg by mouth at bedtime as needed for anxiety. Takes 2.5 mg in the morning and 5 mg at bedtime as needed.      . fish oil-omega-3 fatty acids 1000 MG capsule Take 2 g by mouth daily.      Marland Kitchen losartan-hydrochlorothiazide (HYZAAR) 50-12.5 MG per tablet Take 1 tablet by mouth daily.      Marland Kitchen omeprazole (PRILOSEC) 20 MG capsule Take 40 mg by mouth daily.       No current facility-administered medications for this visit.    Review of Systems Review of Systems  Constitutional:  Negative.   HENT: Negative.   Eyes: Negative.   Respiratory: Negative.   Cardiovascular: Negative.   Gastrointestinal: Negative.   Endocrine: Negative.   Genitourinary: Negative.   Musculoskeletal: Negative.   Skin: Negative.   Allergic/Immunologic: Negative.   Neurological: Negative.   Hematological: Negative.   Psychiatric/Behavioral: Negative.     Blood pressure 136/86, pulse 62, resp. rate 16, height 5\' 10"  (1.778 m), weight 156 lb (70.761 kg).  Physical Exam Physical Exam  Constitutional: He is oriented to person, place, and time. He appears well-developed and well-nourished.  HENT:  Head: Normocephalic and atraumatic.  Eyes: Conjunctivae and EOM are normal. Pupils are equal, round, and reactive to light.  Neck: Normal range of motion. Neck supple.  There is a single enlarged mobile soft lymph node in the left lateral posterior cervical chain area.  Cardiovascular: Normal rate, regular rhythm and normal heart sounds.   Pulmonary/Chest: Effort normal and breath sounds normal.  Abdominal: Soft. Bowel sounds are normal.  Musculoskeletal: Normal range of motion.  Neurological: He is alert and oriented to person, place, and time.  Skin: Skin is warm and dry.  Psychiatric: He has a normal mood and affect. His behavior is normal.    Data  Reviewed As above  Assessment    The patient has an isolated enlargement of a left lateral posterior cervical chain lymph node. The lymph node has been present for at least several months and has not changed. At this point I think the options would be to remove the lymph node and sent it to pathology versus monitoring the lymph node to see if it severed and a change. It is most likely going to be a reactive lymph node given its location in the thigh that there are no other enlarged lymph nodes     Plan    At this point I have offered to remove the lymph node for him. He would rather not have surgery if it's not necessary. I think it would  also be reasonable to watch this lymph node and if she changes her enlarges then remove it. I will plan to see him back in 3 months to check his progress. He agrees to call us if anything changes before then        [place] 05/30/2013, 2:27 PM

## 2013-05-30 NOTE — Patient Instructions (Signed)
Call if node gets bigger

## 2013-06-19 ENCOUNTER — Other Ambulatory Visit: Payer: Self-pay | Admitting: Dermatology

## 2013-07-01 ENCOUNTER — Telehealth (INDEPENDENT_AMBULATORY_CARE_PROVIDER_SITE_OTHER): Payer: Self-pay

## 2013-07-01 NOTE — Telephone Encounter (Signed)
Called pt with appt info

## 2013-07-01 NOTE — Telephone Encounter (Signed)
Message copied by Carlene Coria on Mon Jul 01, 2013 10:10 AM ------      Message from: Humphrey Rolls K      Created: Mon Jul 01, 2013  9:51 AM      Regarding: Dr Orson Gear: [phone]       Patient was last seen 05/30/13 by Dr Marlou Starks for eval for lymphnode removal. Dr Marlou Starks wanted to see him back on 3 mth if it doesn't get bigger. Patient says it is getting bigger and he wants Dr Marlou Starks to look at it again asap. Next avail is 07/29/13 but he thinks he needs to come in sooner.   ------

## 2013-07-08 ENCOUNTER — Ambulatory Visit (INDEPENDENT_AMBULATORY_CARE_PROVIDER_SITE_OTHER): Payer: Medicare Other | Admitting: General Surgery

## 2013-07-08 ENCOUNTER — Encounter (INDEPENDENT_AMBULATORY_CARE_PROVIDER_SITE_OTHER): Payer: Self-pay | Admitting: General Surgery

## 2013-07-08 VITALS — BP 122/70 | HR 74 | Temp 98.1°F | Ht 70.0 in | Wt 156.0 lb

## 2013-07-08 DIAGNOSIS — R599 Enlarged lymph nodes, unspecified: Secondary | ICD-10-CM

## 2013-07-08 DIAGNOSIS — R590 Localized enlarged lymph nodes: Secondary | ICD-10-CM

## 2013-07-08 NOTE — Patient Instructions (Signed)
Call if it gets bigger

## 2013-07-08 NOTE — Progress Notes (Signed)
Subjective:     Patient ID: Douglas Wood, male   DOB: 09-13-1931, 80 y.o.   MRN: [id number]  HPI The patient is an 80 year old white male who we saw recently with an enlarged lymph node in the left posterior neck area. He had had a recent upper respiratory infection. He denies any neck pain. He had no other enlarged lymph nodes anywhere. Since his last visit he thinks that it may have gotten a little bit larger.  Review of Systems  Constitutional: Negative.   HENT: Negative.   Eyes: Negative.   Respiratory: Negative.   Cardiovascular: Negative.   Gastrointestinal: Negative.   Endocrine: Negative.   Genitourinary: Negative.   Musculoskeletal: Negative.   Skin: Negative.   Allergic/Immunologic: Negative.   Neurological: Negative.   Hematological: Negative.   Psychiatric/Behavioral: Negative.        Objective:   Physical Exam  Constitutional: He is oriented to person, place, and time. He appears well-developed and well-nourished.  HENT:  Head: Normocephalic and atraumatic.  Eyes: Conjunctivae and EOM are normal. Pupils are equal, round, and reactive to light.  Neck: Normal range of motion. Neck supple.  There is a small 1 to 1.5 cm mobile round lymph node in the left posterior cervical chain. I do not palpate any other enlarged lymph nodes. There is no neck tenderness. This feels very similar in size to what it was about a month ago  Cardiovascular: Normal rate, regular rhythm and normal heart sounds.   Pulmonary/Chest: Effort normal and breath sounds normal.  Abdominal: Soft. Bowel sounds are normal.  Musculoskeletal: Normal range of motion.  Neurological: He is alert and oriented to person, place, and time.  Skin: Skin is warm and dry.  Psychiatric: He has a normal mood and affect. His behavior is normal.       Assessment:     The patient has an enlarged left posterior cervical chain lymph node. Since he did have a recent upper respiratory infection and he has only a single  enlarged lymph node I think this is most likely reactionary. He would also like to avoid surgery.     Plan:     At this point I think it would be reasonable to observe this lymph node. If it enlarges than it ought to probably be removed. If it continues to say the same size and he has no other symptoms and I think it would be reasonable to just monitor it. I will plan to see him back in about 2 months.

## 2013-08-30 ENCOUNTER — Ambulatory Visit (INDEPENDENT_AMBULATORY_CARE_PROVIDER_SITE_OTHER): Payer: Medicare Other | Admitting: General Surgery

## 2013-08-30 ENCOUNTER — Encounter (INDEPENDENT_AMBULATORY_CARE_PROVIDER_SITE_OTHER): Payer: Self-pay | Admitting: General Surgery

## 2013-08-30 VITALS — BP 138/70 | HR 80 | Temp 97.8°F | Resp 14 | Wt 155.0 lb

## 2013-08-30 DIAGNOSIS — R599 Enlarged lymph nodes, unspecified: Secondary | ICD-10-CM

## 2013-08-30 DIAGNOSIS — R590 Localized enlarged lymph nodes: Secondary | ICD-10-CM

## 2013-08-30 NOTE — Progress Notes (Signed)
Patient ID: Douglas Wood, male   DOB: 08/06/1931, 80 y.o.   MRN: [id number]  Chief Complaint  Patient presents with  . Routine Post Op    3 mo lymph node    HPI Douglas Wood is a 80 y.o. male.  The patient is an 80 year old white male who we have been following for several months for an enlarged lymph node of the left neck. Since his last visit he feels as though it may have gotten a little bit larger. He denies any pain or discomfort in his neck. He does have bad allergies and likes to play golf.  HPI  Past Medical History  Diagnosis Date  . IBS (irritable bowel syndrome)   . Hyperlipidemia   . GERD (gastroesophageal reflux disease)     Past Surgical History  Procedure Laterality Date  . Hemorroidectomy    . Skin cancer excision      History reviewed. No pertinent family history.  Social History History  Substance Use Topics  . Smoking status: Never Smoker   . Smokeless tobacco: Not on file  . Alcohol Use: Yes     Comment: nightly    Allergies  Allergen Reactions  . Levaquin [Levofloxacin In D5w]   . Statins     Current Outpatient Prescriptions  Medication Sig Dispense Refill  . aspirin 81 MG chewable tablet Chew 81 mg by mouth daily.      . diazepam (VALIUM) 5 MG tablet Take 2.5-5 mg by mouth at bedtime as needed for anxiety. Takes 2.5 mg in the morning and 5 mg at bedtime as needed.      . fish oil-omega-3 fatty acids 1000 MG capsule Take 2 g by mouth daily.      Marland Kitchen losartan-hydrochlorothiazide (HYZAAR) 50-12.5 MG per tablet Take 1 tablet by mouth daily.      Marland Kitchen omeprazole (PRILOSEC) 20 MG capsule Take 40 mg by mouth daily.       No current facility-administered medications for this visit.    Review of Systems Review of Systems  Constitutional: Negative.   HENT: Negative.   Eyes: Negative.   Respiratory: Negative.   Cardiovascular: Negative.   Gastrointestinal: Negative.   Endocrine: Negative.   Genitourinary: Negative.   Musculoskeletal: Negative.    Skin: Negative.   Allergic/Immunologic: Negative.   Neurological: Negative.   Hematological: Negative.   Psychiatric/Behavioral: Negative.     Blood pressure 138/70, pulse 80, temperature 97.8 F (36.6 C), resp. rate 14, weight 155 lb (70.308 kg).  Physical Exam Physical Exam  Constitutional: He is oriented to person, place, and time. He appears well-developed and well-nourished.  HENT:  Head: Normocephalic and atraumatic.  Eyes: Conjunctivae and EOM are normal. Pupils are equal, round, and reactive to light.  Neck: Normal range of motion. Neck supple.  There is a small 1-1-1/2 cm mobile round lymph node in the left posterior lateral neck. I do not palpate any other lymphadenopathy in the neck  Cardiovascular: Normal rate, regular rhythm and normal heart sounds.   Pulmonary/Chest: Effort normal and breath sounds normal.  Abdominal: Soft. Bowel sounds are normal.  Musculoskeletal: Normal range of motion.  Neurological: He is alert and oriented to person, place, and time.  Skin: Skin is warm and dry.  Psychiatric: He has a normal mood and affect. His behavior is normal.    Data Reviewed As above  Assessment    The patient has a persistently enlarged lymph node in the posterior lateral left neck. Since his last visit  he feels that may have gotten a little bit larger. For this reason I think it would be reasonable to biopsy it. I've discussed with him in detail the risks and benefits of the operation a biopsy of this lymph node as well as some of the technical aspects and he understands and wishes to proceed     Plan    Plan for left neck lymph node biopsy        Luella Cook III 08/30/2013, 9:12 AM

## 2013-09-11 ENCOUNTER — Other Ambulatory Visit (INDEPENDENT_AMBULATORY_CARE_PROVIDER_SITE_OTHER): Payer: Self-pay | Admitting: General Surgery

## 2013-09-11 DIAGNOSIS — R599 Enlarged lymph nodes, unspecified: Secondary | ICD-10-CM

## 2013-09-12 ENCOUNTER — Other Ambulatory Visit (INDEPENDENT_AMBULATORY_CARE_PROVIDER_SITE_OTHER): Payer: Self-pay

## 2013-09-12 ENCOUNTER — Encounter (INDEPENDENT_AMBULATORY_CARE_PROVIDER_SITE_OTHER): Payer: Self-pay

## 2013-09-12 ENCOUNTER — Telehealth (INDEPENDENT_AMBULATORY_CARE_PROVIDER_SITE_OTHER): Payer: Self-pay

## 2013-09-12 MED ORDER — OXYCODONE-ACETAMINOPHEN 5-325 MG PO TABS: 1.0000 | ORAL_TABLET | Freq: Four times a day (QID) | ORAL | Status: DC | PRN

## 2013-09-12 NOTE — Telephone Encounter (Signed)
Pt had a cervical lymph node bx yesterday by Dr. Marlou Starks.  He states that after he took his Oxycodone last night he had an episode of vertigo.  He has not taken any more since then.  He is not having much pain at all, just significant swelling.  He is applying ice to the area and taking Aleve.  I instructed him to stop the Oxycodone and we would make a note in his chart that he has a reaction to the medication.

## 2013-09-17 ENCOUNTER — Telehealth (INDEPENDENT_AMBULATORY_CARE_PROVIDER_SITE_OTHER): Payer: Self-pay

## 2013-09-17 NOTE — Telephone Encounter (Signed)
Message copied by Carlene Coria on Tue Sep 17, 2013  4:49 PM ------      Message from: Luella Cook III      Created: Fri Sep 13, 2013  4:23 PM       Lymph node looked neg. ------

## 2013-09-17 NOTE — Telephone Encounter (Signed)
Called pt with benign path report.  

## 2013-09-24 ENCOUNTER — Ambulatory Visit (INDEPENDENT_AMBULATORY_CARE_PROVIDER_SITE_OTHER): Payer: Medicare Other | Admitting: General Surgery

## 2013-09-24 ENCOUNTER — Encounter (INDEPENDENT_AMBULATORY_CARE_PROVIDER_SITE_OTHER): Payer: Self-pay | Admitting: General Surgery

## 2013-09-24 VITALS — BP 126/76 | HR 74 | Temp 98.5°F | Ht 70.0 in | Wt 153.0 lb

## 2013-09-24 DIAGNOSIS — R599 Enlarged lymph nodes, unspecified: Secondary | ICD-10-CM

## 2013-09-24 DIAGNOSIS — R590 Localized enlarged lymph nodes: Secondary | ICD-10-CM

## 2013-09-24 NOTE — Patient Instructions (Signed)
May return to normal activities 

## 2013-09-24 NOTE — Progress Notes (Signed)
Subjective:     Patient ID: Douglas Wood, male   DOB: 09/27/1931, 80 y.o.   MRN: [id number]  HPI The patient is an 80 year old white male who is a couple weeks status post excision of a lymph node from his left neck. He tolerated the surgery well. He did have some numbness around the incision but this is gradually fading away. He denies any pain. He is back playing golf without any problems.  Review of Systems     Objective:   Physical Exam On exam his left neck incision is healing nicely with no sign of infection    Assessment:     The patient is 2 weeks status post excision of a lymph node from his left neck. The pathology was benign.     Plan:     At this point the patient may return to his normal activities without restriction. I will plan to see him back in about a month to check his progress

## 2013-10-22 ENCOUNTER — Encounter (INDEPENDENT_AMBULATORY_CARE_PROVIDER_SITE_OTHER): Payer: Self-pay | Admitting: General Surgery

## 2013-10-22 ENCOUNTER — Ambulatory Visit (INDEPENDENT_AMBULATORY_CARE_PROVIDER_SITE_OTHER): Payer: Medicare Other | Admitting: General Surgery

## 2013-10-22 VITALS — BP 126/72 | HR 73 | Temp 97.5°F | Ht 70.0 in | Wt 152.0 lb

## 2013-10-22 DIAGNOSIS — R590 Localized enlarged lymph nodes: Secondary | ICD-10-CM

## 2013-10-22 DIAGNOSIS — R599 Enlarged lymph nodes, unspecified: Secondary | ICD-10-CM

## 2013-10-22 NOTE — Progress Notes (Signed)
Subjective:     Patient ID: Douglas Wood, male   DOB: 06/21/1931, 80 y.o.   MRN: [id number]  HPI The patient is an 80 year old white male who is several weeks status post excision of a benign lymph node from his left cervical region. He has no complaints today.   Review of Systems     Objective:   Physical Exam On exam the left neck incision is healing nicely with no sign of infection or seroma.    Assessment:     The patient is several weeks status post excision of benign lymph node from the Neck     Plan:     At this point he may return to his normal Activities without restriction. I will plan to see him back on a when necessary basis

## 2013-10-22 NOTE — Patient Instructions (Signed)
May return to normal activities 

## 2013-11-08 ENCOUNTER — Other Ambulatory Visit: Payer: Self-pay | Admitting: Dermatology

## 2014-04-23 ENCOUNTER — Other Ambulatory Visit: Payer: Self-pay | Admitting: Dermatology

## 2014-06-04 ENCOUNTER — Other Ambulatory Visit: Payer: Self-pay | Admitting: Dermatology

## 2014-08-08 ENCOUNTER — Encounter: Payer: Self-pay | Admitting: Podiatry

## 2014-08-08 ENCOUNTER — Ambulatory Visit (INDEPENDENT_AMBULATORY_CARE_PROVIDER_SITE_OTHER): Payer: Medicare Other

## 2014-08-08 ENCOUNTER — Ambulatory Visit (INDEPENDENT_AMBULATORY_CARE_PROVIDER_SITE_OTHER): Payer: Medicare Other | Admitting: Podiatry

## 2014-08-08 ENCOUNTER — Telehealth: Payer: Self-pay | Admitting: *Deleted

## 2014-08-08 VITALS — BP 139/85 | HR 80 | Resp 18

## 2014-08-08 DIAGNOSIS — R52 Pain, unspecified: Secondary | ICD-10-CM | POA: Diagnosis not present

## 2014-08-08 DIAGNOSIS — S92301A Fracture of unspecified metatarsal bone(s), right foot, initial encounter for closed fracture: Secondary | ICD-10-CM | POA: Diagnosis not present

## 2014-08-08 NOTE — Progress Notes (Signed)
   Subjective:    Patient ID: Douglas Wood, male    DOB: [birth year], 81 y.o.   MRN: [id number]  HPI  81 year old male presents the office with complaints of right foot injury. He states that yesterday he fell injuring the right foot. He states that he has pain with ambulation and pressure to the area and he has noticed swelling overlying the foot. He is been ambulating a regular shoe. Denies any other injury at the time of the fall. No other complaints at this time.    Review of Systems  Musculoskeletal: Positive for gait problem.  All other systems reviewed and are negative.      Objective:   Physical Exam AAO x3, NAD DP/PT pulses palpable bilaterally, CRT less than 3 seconds Protective sensation intact with Simms Weinstein monofilament, vibratory sensation intact, Achilles tendon reflex intact There is tenderness palpation over the right fifth metatarsal base. There no other areas of tenderness to the foot or ankle bilaterally. There is moderate edema overlying the lateral aspect of the right foot. There is no scissoring erythema or increased warmth. There is no pain along the course of the peroneal tendons and the peroneal tendons appear to be intact.  MMT 5/5, ROM WNL.  No open lesions or pre-ulcerative lesions.  No overlying edema, erythema, increase in warmth to bilateral lower extremities.  No pain with calf compression, swelling, warmth, erythema bilaterally.      Assessment & Plan:  81 year old male right fifth metatarsal base fracture -X-rays were obtained and reviewed with the patient. -Treatment options discussed including alternatives, risks, complications. -I discussed with the patient immobilization including CAM boot with nonweightbearing however given the location of the fracture and slight displacement discussed surgical intervention which would include percutaneous screw fixation to the fifth metatarsal. I discussed both options with the patient. At this time he like  to proceed with surgical intervention however he'll discuss this with his wife. I did discuss the surgery with the patient which included percutaneous screw fixation to the right fifth metatarsal base versus open reduction internal fixation. -The incision placement as well as the postoperative course was discussed with the patient. I discussed risks of the surgery which include, but not limited to, infection, bleeding, pain, swelling, need for further surgery, delayed or nonhealing, painful or ugly scar, numbness or sensation changes, over/under correction, recurrence, transfer lesions, further deformity, hardware failure, DVT/PE, also toe/foot. Patient understands these risks and wishes to proceed with surgery. The surgical consent was reviewed with the patient all 3 pages were signed. No promises or guarantees were given to the outcome of the procedure. All questions were answered to the best of my ability. Before the surgery the patient was encouraged to call the office if there is any further questions. The surgery will be performed at the [facility] on an outpatient basis. -Rx crutches and knee scooter.  -Dispensed CAM walker.  -Follow up after surgery. If he desires to hold off on surgery, follow-up in 2 weeks.

## 2014-08-08 NOTE — Patient Instructions (Signed)
Pre-Operative Instructions  Congratulations, you have decided to take an important step to improving your quality of life.  You can be assured that the doctors of [facility] will be with you every step of the way.  1. Plan to be at the surgery center/hospital at least 1 (one) hour prior to your scheduled time unless otherwise directed by the surgical center/hospital staff.  You must have a responsible adult accompany you, remain during the surgery and drive you home.  Make sure you have directions to the surgical center/hospital and know how to get there on time. 2. For hospital based surgery you will need to obtain a history and physical form from your family physician within 1 month prior to the date of surgery- we will give you a form for you primary physician.  3. We make every effort to accommodate the date you request for surgery.  There are however, times where surgery dates or times have to be moved.  We will contact you as soon as possible if a change in schedule is required.   4. No Aspirin/Ibuprofen for one week before surgery.  If you are on aspirin, any non-steroidal anti-inflammatory medications (Mobic, Aleve, Ibuprofen) you should stop taking it 7 days prior to your surgery.  You make take Tylenol  For pain prior to surgery.  5. Medications- If you are taking daily heart and blood pressure medications, seizure, reflux, allergy, asthma, anxiety, pain or diabetes medications, make sure the surgery center/hospital is aware before the day of surgery so they may notify you which medications to take or avoid the day of surgery. 6. No food or drink after midnight the night before surgery unless directed otherwise by surgical center/hospital staff. 7. No alcoholic beverages 24 hours prior to surgery.  No smoking 24 hours prior to or 24 hours after surgery. 8. Wear loose pants or shorts- loose enough to fit over bandages, boots, and casts. 9. No slip on shoes, sneakers are best. 10. Bring  your boot with you to the surgery center/hospital.  Also bring crutches or a walker if your physician has prescribed it for you.  If you do not have this equipment, it will be provided for you after surgery. 11. If you have not been contracted by the surgery center/hospital by the day before your surgery, call to confirm the date and time of your surgery. 12. Leave-time from work may vary depending on the type of surgery you have.  Appropriate arrangements should be made prior to surgery with your employer. 13. Prescriptions will be provided immediately following surgery by your doctor.  Have these filled as soon as possible after surgery and take the medication as directed. 14. Remove nail polish on the operative foot. 15. Wash the night before surgery.  The night before surgery wash the foot and leg well with the antibacterial soap provided and water paying special attention to beneath the toenails and in between the toes.  Rinse thoroughly with water and dry well with a towel.  Perform this wash unless told not to do so by your physician.  Enclosed: 1 Ice pack (please put in freezer the night before surgery)   1 Hibiclens skin cleaner   Pre-op Instructions  If you have any questions regarding the instructions, do not hesitate to call our office.  [place]: [address] [place], [place] [address] [phone]  [place]: [address] , [place] [address] [phone]  [place]: [address]  [place], [place] [address] [phone]  Dr. Richard   Tuchman DPM, Dr. Norman Regal DPM Dr. Richard Sikora DPM, Dr. M. Todd Hyatt DPM, Dr. Kathryn Egerton DPM, Dr. Matthew Wagoner DPM 

## 2014-08-08 NOTE — Telephone Encounter (Signed)
Pt states he would like his wife to pick up a copy of today's x-rays, when she comes for her appt with Dr. Jacqualyn Posey today.  I checked with Renaldo Harrison, she states if pt gave verbal permission over the phone, we could give the copies without written permission.

## 2014-08-11 ENCOUNTER — Encounter: Payer: Self-pay | Admitting: Podiatry

## 2014-08-11 ENCOUNTER — Telehealth: Payer: Self-pay | Admitting: *Deleted

## 2014-08-11 NOTE — Telephone Encounter (Signed)
"  I need to cancel my surgery with Dr. Jacqualyn Posey for next Monday."  Is there any particular reason why you want to cancel?  "I went and saw one of my Orthopedic friends and he said surgery is not needed.  We can go conservative.  He put me a surgery boot and told me he'd see me back in 2 weeks.  So, just cancel all my appointments scheduled with Dr. Jacqualyn Posey."  Faythe Ghee, I'll take care of it.  I called and canceled surgery at [facility].  "I had just left you a message that we were not going to be able to do the case here due to expenses."

## 2014-08-11 NOTE — Telephone Encounter (Signed)
Ok thaks

## 2014-10-20 ENCOUNTER — Other Ambulatory Visit: Payer: Self-pay

## 2015-09-28 ENCOUNTER — Ambulatory Visit
Admission: RE | Admit: 2015-09-28 | Discharge: 2015-09-28 | Disposition: A | Discharge status: Home / Self Care | Payer: Medicare Other | Source: Ambulatory Visit | Attending: Family Medicine | Admitting: Family Medicine

## 2015-09-28 ENCOUNTER — Other Ambulatory Visit: Payer: Self-pay | Admitting: Family Medicine

## 2015-09-28 DIAGNOSIS — R0602 Shortness of breath: Secondary | ICD-10-CM

## 2016-09-15 ENCOUNTER — Other Ambulatory Visit: Payer: Self-pay | Admitting: Orthopedic Surgery

## 2016-09-15 DIAGNOSIS — R2232 Localized swelling, mass and lump, left upper limb: Secondary | ICD-10-CM

## 2016-09-22 ENCOUNTER — Ambulatory Visit
Admission: RE | Admit: 2016-09-22 | Discharge: 2016-09-22 | Disposition: A | Discharge status: Home / Self Care | Payer: Medicare Other | Source: Ambulatory Visit | Attending: Orthopedic Surgery | Admitting: Orthopedic Surgery

## 2016-09-22 DIAGNOSIS — R2232 Localized swelling, mass and lump, left upper limb: Secondary | ICD-10-CM

## 2016-09-29 ENCOUNTER — Other Ambulatory Visit: Payer: Self-pay | Admitting: Orthopedic Surgery

## 2017-03-10 ENCOUNTER — Other Ambulatory Visit: Payer: Self-pay | Admitting: Urology

## 2017-03-10 DIAGNOSIS — D494 Neoplasm of unspecified behavior of bladder: Secondary | ICD-10-CM

## 2017-03-13 ENCOUNTER — Other Ambulatory Visit: Payer: Self-pay | Admitting: Urology

## 2017-03-27 ENCOUNTER — Encounter (HOSPITAL_BASED_OUTPATIENT_CLINIC_OR_DEPARTMENT_OTHER): Payer: Self-pay | Admitting: *Deleted

## 2017-03-27 ENCOUNTER — Other Ambulatory Visit: Payer: Self-pay

## 2017-03-27 NOTE — Progress Notes (Signed)
Npo after midnight, take omeprazole sip of water in am, son or wife driver, called dr Joaquin Music office patient stated had ekg there in last year, per office last ekg September 25, 2015, need I stat 4 and ekg.

## 2017-04-10 NOTE — H&P (Signed)
HPI: Douglas Wood is a 83 year-old male with a newly diagnosed large bladder tumor.  He did see the blood in his urine. He has not seen blood clots.   He does have a burning sensation when he urinates. He is not currently having trouble urinating.   He has had kidney stones. He is not having pain. He has not recently had unwanted weight loss.   03/02/17: Painless gross hematuria 1 day. No precipitating event. Denies any recent fever or infection. No recent worsening or changes to voiding symptoms. He continues on Rapaflo with good therapeutic results. Denies any unexplained bone pain, I explained weight loss, unexplained fatigue. His primary care provider has been following him for recently found protein in his urine but no referral to nephrology has been made as of yet.   03/08/17: He reports he has seen no further gross hematuria. He has been taking Rapaflo and reports that this has resulted in significant improvement in his voiding.     ALLERGIES: Levaquin TABS    MEDICATIONS: Rapaflo 8 mg capsule take 1 capsule by mouth at bedtime  Simvastatin 20 mg tablet  Diazepam PRN  Fish Oil  Losartan Potassium 25 mg tablet  Multiple Vitamin     GU PSH: Cystoscopy Insert Stent - 13-Aug-2007 Locm 300-399Mg /Ml Iodine,1Ml - 03/07/2017 Ureteroscopic stone removal - 13-Aug-2007      PSH Notes: Cystoscopy With Ureteroscopy With Removal Of Calculus, Cystoscopy With Insertion Of Ureteral Stent Right, Dental Surgery, Hemorrhoidectomy   NON-GU PSH: Hemorrhoidectomy (favorite) - August 13, 2006    GU PMH: Gross hematuria - 03/02/2017 Chronic prostatitis, Prostatitis, chronic - 08/13/15 BPH w/LUTS, Benign prostatic hyperplasia with urinary obstruction - 08/12/2012 Encounter for Prostate Cancer screening, Prostate cancer screening - 12-Aug-2012 Inflammatory Disease Prostate, Unspec, Prostatitis - 08-12-12 Renal calculus, Kidney stone on right side - 12-Aug-2012 Testicular atrophy, Testicular atrophy - 08-12-12 Ureteral calculus, Ureteral Stone 2012/08/12       PMH Notes: BPH with outlet obstruction: This has been present for a number of years.  Current therapy: Rapaflo   Chronic prostatitis: He has had difficulty with this over the years. It has responded to antibiotic therapy.   Nephrolithiasis: He underwent a CT scan in 12/09 which revealed a 2 mm stone in the right kidney. He also passed a stone at that time. His stone analysis revealed calcium oxalate.   Solitary right kidney: He has congenital absence of the left kidney.     NON-GU PMH: Encounter for general adult medical examination without abnormal findings, Encounter for preventive health examination - 2015/08/13 Renal agenesis, unspecified, Congenital absence of kidney - 12-Aug-2012 Sebaceous cyst, Sebaceous cyst - August 12, 2012    FAMILY HISTORY: Death In The Family Father - Runs In Family Death In The Family Mother - Runs In Family   SOCIAL HISTORY: Marital Status: Married Preferred Language: English; Ethnicity: Not Hispanic Or Latino; Race: White Current Smoking Status: Patient does not smoke anymore. Has not smoked since 02/24/1967.   Tobacco Use Assessment Completed: Used Tobacco in last 30 days? Does drink.  Drinks 1 caffeinated drink per day.     Notes: Former smoker, Occupation:, Alcohol Use, Caffeine Use, Marital History - Currently Married   REVIEW OF SYSTEMS:    GU Review Male:   Patient reports get up at night to urinate. Patient denies frequent urination, hard to postpone urination, burning/ pain with urination, leakage of urine, stream starts and stops, trouble starting your stream, have to strain to urinate , erection problems, and penile pain.  Gastrointestinal (Upper):   Patient denies nausea, vomiting, and indigestion/ heartburn.  Gastrointestinal (Lower):   Patient denies constipation and diarrhea.  Constitutional:   Patient denies fever, night sweats, weight loss, and fatigue.  Skin:   Patient denies skin rash/ lesion and itching.  Eyes:   Patient denies blurred vision and  double vision.  Ears/ Nose/ Throat:   Patient denies sore throat and sinus problems.  Hematologic/Lymphatic:   Patient denies swollen glands and easy bruising.  Cardiovascular:   Patient denies leg swelling and chest pains.  Respiratory:   Patient denies cough and shortness of breath.  Endocrine:   Patient denies excessive thirst.  Musculoskeletal:   Patient denies back pain and joint pain.  Neurological:   Patient denies headaches and dizziness.  Psychologic:   Patient denies depression and anxiety.   VITAL SIGNS:    Weight 144 lb / 65.32 kg  BP 133/81 mmHg  Pulse 86 /min   GU PHYSICAL EXAMINATION:    Urethral Meatus: Normal size. No lesion, no wart, no discharge, no polyp. Normal location.  Penis: Circumcised, no warts, no cracks. No dorsal Peyronie's plaques, no left corporal Peyronie's plaques, no right corporal Peyronie's plaques, no scarring, no warts. No balanitis, no meatal stenosis.   MULTI-SYSTEM PHYSICAL EXAMINATION:    Constitutional: Well-nourished. No physical deformities. Normally developed. Good grooming.  Neck: Neck symmetrical, not swollen. Normal tracheal position.  Respiratory: No labored breathing, no use of accessory muscles.   Cardiovascular: Normal temperature, normal extremity pulses, no swelling, no varicosities.  Skin: No paleness, no jaundice, no cyanosis. No lesion, no ulcer, no rash.  Neurologic / Psychiatric: Oriented to time, oriented to place, oriented to person. No depression, no anxiety, no agitation.  Gastrointestinal: No mass, no tenderness, no rigidity, non obese abdomen.       PAST DATA REVIEWED:  Source Of History:  Patient  Records Review:   Previous Patient Records, POC Tool  X-Ray Review: C.T. Hematuria: Reviewed Films. Reviewed Report. Discussed With Patient. He has a large mass involving the right wall and posterior wall of his bladder with no evidence of ureteral obstruction or hydronephrosis. Right renal cysts are noted.    06/15/11  05/31/10 03/17/08 04/17/07 03/14/07 02/14/06 02/16/05 02/20/04  PSA  Total PSA 1.28  1.48  2.55  1.97  3.78  1.16  1.19  0.84    Notes:                     EXAM:  CT ABDOMEN AND PELVIS WITHOUT AND WITH CONTRAST   TECHNIQUE:  Multidetector CT imaging of the abdomen and pelvis was performed  following the standard protocol before and following the bolus  administration of intravenous contrast.   CONTRAST: 100 mL Isovue 300 IV   COMPARISON: 03/18/2009   FINDINGS:  Lower chest: Lung bases are essentially clear, noting bullous  changes in the left lower lobe.   Hepatobiliary: Liver is within normal limits.   Gallbladder is unremarkable. No intrahepatic or extrahepatic ductal  dilatation.   Pancreas: Within normal limits.   Spleen: Within normal limits.   Adrenals/Urinary Tract: Adrenal glands are within normal limits.   Left kidney is not discretely visualized, reportedly congenitally  absent.   Scattered small right renal cysts measuring up to 10 mm in the  lateral right upper pole (series 5/ image 26). No enhancing renal  lesions.   No right renal, ureteral, or bladder calculi.   4.8 x 4.6 cm right bladder mass near the right  ureteral orifice  (series 5/image 71), compatible with primary bladder neoplasm.   On delayed imaging, there are no filling defects in the right upper  pole collecting system or ureter to suggest upper tract disease.   Stomach/Bowel: Stomach is within normal limits.   No evidence of bowel obstruction.   Appendix is not discretely visualized.   Vascular/Lymphatic: No evidence of abdominal aortic aneurysm.   Atherosclerotic calcifications of the abdominal aorta and branch  vessels.   No suspicious abdominopelvic lymphadenopathy.   Reproductive: Prostate there is grossly unremarkable.   Other: No abdominopelvic ascites.   Tiny fat containing right inguinal hernia (series 5/image 79).   Musculoskeletal: Visualized osseous structures are  within normal  limits.   IMPRESSION:  4.8 cm right bladder mass near the right ureteral orifice,  compatible with primary bladder neoplasm.   No findings to suggest upper tract disease.   No evidence of metastatic disease in the abdomen/ pelvis.   Left kidney is not discretely visualized, reportedly congenitally  absent.     PROCEDURES:         Flexible Cystoscopy - done on 03/08/17 Risks, benefits, and some of the potential complications of the procedure were discussed at length with the patient including infection, bleeding, voiding discomfort, urinary retention, fever, chills, sepsis, and others. All questions were answered. Informed consent was obtained. Sterile technique and 2% Lidocaine intraurethral analgesia were used.  Meatus:  Normal size. Normal location. Normal condition.  Urethra:  No strictures.  External Sphincter:  Normal.  Verumontanum:  Normal.  Prostate:  Obstructing. Moderate hyperplasia.  Bladder Neck:  Non-obstructing.  Ureteral Orifices:  Absent left orifice. I was not able to identify his right UO due to the presence of his tumor.  Bladder:  A trigone tumor. >5 cm tumor. His tumor involves the right hemi trigone and on to the right wall of the bladder. No trabeculation. Normal mucosa. No stones.      The lower urinary tract was carefully examined. The procedure was well-tolerated and without complications. Instructions were given to call the office immediately for bloody urine, difficulty urinating, urinary retention, painful or frequent urination, fever or other illness. The patient stated that he understood these instructions and would comply with them.         Urinalysis w/Scope Dipstick Dipstick Cont'd Micro  Color: Amber Bilirubin: Neg WBC/hpf: 10 - 20/hpf  Appearance: Clear Ketones: Neg RBC/hpf: >60/hpf  Specific Gravity: 1.025 Blood: 3+ Bacteria: Mod (26-50/hpf)  pH: 5.5 Protein: 1+ Cystals: NS (Not Seen)  Glucose: Neg Urobilinogen: 0.2 Casts: NS  (Not Seen)    Nitrites: Neg Trichomonas: Not Present    Leukocyte Esterase: Trace Mucous: Not Present      Epithelial Cells: 0 - 5/hpf      Yeast: NS (Not Seen)      Sperm: Not Present    ASSESSMENT/PLAN: otes:   I went over the results of the CT scan as well as my cystoscopic findings today which have revealed a bladder mass consistent with transitional cell carcinoma. We discussed the fact that currently there is no evidence of extravesical extension or pelvic adenopathy based on CT scan findings. Further characterization of the lesion is required for grading and staging purposes. We discussed proceeding with evaluation using transurethral resection of the lesion. I have discussed the procedure in detail as well as the potential risks and complications associated with this form of surgery. We also discussed the probability of successful resection of the intravesical portion of this lesion. I  have recommended, as long as there is no contraindication at the time of surgery, the placement of intravesical mitomycin-C in order to reduce the risk of recurrence. We did discuss the potential side effects of this form of intravesical chemotherapy. The procedure will be performed under anesthesia as an outpatient.        ICD-10 Details  1 GU:   Gross hematuria - R31.0 Stable - His gross hematuria is secondary to the large bladder tumor identified within his bladder today.  2   Bladder tumor/neoplasm - D41.4 He has a large bladder tumor that in fact measures greater than 5 cm cystoscopically. It involves the right floor of his bladder and could possibly also involve his right ureteral orifice. Because of that I told him he would be at a slight risk because of having only 1 kidney and I will therefore need to monitor him for hydronephrosis if his ureteral orifice is involved and requires resection.

## 2017-04-11 ENCOUNTER — Other Ambulatory Visit: Payer: Self-pay

## 2017-04-11 ENCOUNTER — Encounter (HOSPITAL_BASED_OUTPATIENT_CLINIC_OR_DEPARTMENT_OTHER): Payer: Self-pay | Admitting: *Deleted

## 2017-04-11 NOTE — Progress Notes (Signed)
SPOKE W/ PT VIA PHONE FOR PRE-OP INTERVIEW.  THIS PT IS RESCHEDULED FOR 04-03-2017 (SNOW STROM).  PER PT NO CHANGES IN HEALTH HISTORY DENIES ANY SYMPTOMS.  NPO AFTER MN.  ARRIVE AT 0930.  NEEDS ISTAT 8 AND EKG.  WILL TAKE PRILOSEC AM DOS W/ SIPS OF WATER.

## 2017-04-14 ENCOUNTER — Ambulatory Visit (HOSPITAL_BASED_OUTPATIENT_CLINIC_OR_DEPARTMENT_OTHER)
Admission: RE | Admit: 2017-04-14 | Discharge: 2017-04-14 | Disposition: A | Discharge status: Home / Self Care | Payer: Medicare Other | Source: Ambulatory Visit | Attending: Urology | Admitting: Urology

## 2017-04-14 ENCOUNTER — Encounter (HOSPITAL_BASED_OUTPATIENT_CLINIC_OR_DEPARTMENT_OTHER): Payer: Self-pay | Admitting: Certified Registered"

## 2017-04-14 ENCOUNTER — Encounter (HOSPITAL_BASED_OUTPATIENT_CLINIC_OR_DEPARTMENT_OTHER): Payer: Self-pay | Admitting: Anesthesiology

## 2017-04-14 ENCOUNTER — Encounter (HOSPITAL_BASED_OUTPATIENT_CLINIC_OR_DEPARTMENT_OTHER)
Admission: RE | Disposition: A | Discharge status: Home / Self Care | Payer: Self-pay | Source: Ambulatory Visit | Attending: Urology

## 2017-04-14 DIAGNOSIS — N323 Diverticulum of bladder: Secondary | ICD-10-CM | POA: Diagnosis not present

## 2017-04-14 DIAGNOSIS — Z79899 Other long term (current) drug therapy: Secondary | ICD-10-CM | POA: Insufficient documentation

## 2017-04-14 DIAGNOSIS — N401 Enlarged prostate with lower urinary tract symptoms: Secondary | ICD-10-CM | POA: Insufficient documentation

## 2017-04-14 DIAGNOSIS — Z87442 Personal history of urinary calculi: Secondary | ICD-10-CM | POA: Insufficient documentation

## 2017-04-14 DIAGNOSIS — N138 Other obstructive and reflux uropathy: Secondary | ICD-10-CM | POA: Diagnosis not present

## 2017-04-14 DIAGNOSIS — D494 Neoplasm of unspecified behavior of bladder: Secondary | ICD-10-CM

## 2017-04-14 DIAGNOSIS — Q600 Renal agenesis, unilateral: Secondary | ICD-10-CM | POA: Diagnosis not present

## 2017-04-14 DIAGNOSIS — Z87891 Personal history of nicotine dependence: Secondary | ICD-10-CM | POA: Diagnosis not present

## 2017-04-14 DIAGNOSIS — C678 Malignant neoplasm of overlapping sites of bladder: Secondary | ICD-10-CM | POA: Insufficient documentation

## 2017-04-14 HISTORY — DX: Unspecified osteoarthritis, unspecified site: M19.90

## 2017-04-14 HISTORY — DX: Other specified postprocedural states: Z85.9

## 2017-04-14 HISTORY — DX: Personal history of other malignant neoplasm of skin: Z98.890

## 2017-04-14 HISTORY — DX: Personal history of other diseases of male genital organs: Z87.438

## 2017-04-14 HISTORY — DX: Renal agenesis, unilateral: Q60.0

## 2017-04-14 HISTORY — DX: Personal history of other malignant neoplasm of skin: Z85.828

## 2017-04-14 HISTORY — DX: Dysuria: R30.0

## 2017-04-14 HISTORY — DX: Personal history of urinary calculi: Z87.442

## 2017-04-14 HISTORY — DX: Reserved for concepts with insufficient information to code with codable children: IMO0002

## 2017-04-14 HISTORY — DX: Essential (primary) hypertension: I10

## 2017-04-14 HISTORY — PX: TRANSURETHRAL RESECTION OF BLADDER TUMOR: SHX2575

## 2017-04-14 LAB — POCT I-STAT, CHEM 8
BUN: 21 mg/dL — ABNORMAL HIGH (ref 6–20)
Calcium, Ion: 1.22 mmol/L (ref 1.15–1.40)
Chloride: 105 mmol/L (ref 101–111)
Creatinine, Ser: 0.8 mg/dL (ref 0.61–1.24)
Glucose, Bld: 103 mg/dL — ABNORMAL HIGH (ref 65–99)
HCT: 45 % (ref 39.0–52.0)
Hemoglobin: 15.3 g/dL (ref 13.0–17.0)
Potassium: 4.2 mmol/L (ref 3.5–5.1)
Sodium: 143 mmol/L (ref 135–145)
TCO2: 28 mmol/L (ref 22–32)

## 2017-04-14 SURGERY — TURBT (TRANSURETHRAL RESECTION OF BLADDER TUMOR)
Anesthesia: General

## 2017-04-14 MED ORDER — FENTANYL CITRATE (PF) 100 MCG/2ML IJ SOLN
INTRAMUSCULAR | Status: AC
  Filled 2017-04-14: qty 2

## 2017-04-14 MED ORDER — PROPOFOL 10 MG/ML IV BOLUS
INTRAVENOUS | Status: DC | PRN
  Administered 2017-04-14: 120 mg via INTRAVENOUS

## 2017-04-14 MED ORDER — DEXTROSE 5 % IV SOLN
2.0000 g | Freq: Once | INTRAVENOUS | Status: AC
  Administered 2017-04-14: 2 g via INTRAVENOUS
  Filled 2017-04-14: qty 2

## 2017-04-14 MED ORDER — PHENAZOPYRIDINE HCL 200 MG PO TABS: 200.0000 mg | ORAL_TABLET | Freq: Three times a day (TID) | ORAL | 0 refills | Status: DC | PRN

## 2017-04-14 MED ORDER — DEXAMETHASONE SODIUM PHOSPHATE 10 MG/ML IJ SOLN
INTRAMUSCULAR | Status: DC | PRN
  Administered 2017-04-14: 10 mg via INTRAVENOUS

## 2017-04-14 MED ORDER — DEXAMETHASONE SODIUM PHOSPHATE 10 MG/ML IJ SOLN
INTRAMUSCULAR | Status: AC
  Filled 2017-04-14: qty 1

## 2017-04-14 MED ORDER — LIDOCAINE 2% (20 MG/ML) 5 ML SYRINGE
INTRAMUSCULAR | Status: DC | PRN
  Administered 2017-04-14: 60 mg via INTRAVENOUS

## 2017-04-14 MED ORDER — SUGAMMADEX SODIUM 200 MG/2ML IV SOLN
INTRAVENOUS | Status: AC
  Filled 2017-04-14: qty 2

## 2017-04-14 MED ORDER — CEFTRIAXONE SODIUM 2 G IJ SOLR
INTRAMUSCULAR | Status: AC
  Filled 2017-04-14: qty 2

## 2017-04-14 MED ORDER — ONDANSETRON HCL 4 MG/2ML IJ SOLN
INTRAMUSCULAR | Status: DC | PRN
  Administered 2017-04-14: 4 mg via INTRAVENOUS

## 2017-04-14 MED ORDER — SODIUM CHLORIDE 0.9 % IR SOLN
Status: DC | PRN
  Administered 2017-04-14 (×10): 3000 mL via INTRAVESICAL

## 2017-04-14 MED ORDER — ONDANSETRON HCL 4 MG/2ML IJ SOLN
INTRAMUSCULAR | Status: AC
  Filled 2017-04-14: qty 2

## 2017-04-14 MED ORDER — BELLADONNA ALKALOIDS-OPIUM 16.2-60 MG RE SUPP
RECTAL | Status: AC
  Filled 2017-04-14: qty 1

## 2017-04-14 MED ORDER — FENTANYL CITRATE (PF) 100 MCG/2ML IJ SOLN
25.0000 ug | INTRAMUSCULAR | Status: DC | PRN
  Administered 2017-04-14 (×2): 25 ug via INTRAVENOUS
  Filled 2017-04-14: qty 1

## 2017-04-14 MED ORDER — DEXTROSE 5 % IV SOLN
1.0000 g | Freq: Once | INTRAVENOUS | Status: DC
  Filled 2017-04-14: qty 10

## 2017-04-14 MED ORDER — FENTANYL CITRATE (PF) 100 MCG/2ML IJ SOLN
INTRAMUSCULAR | Status: DC | PRN
  Administered 2017-04-14: 50 ug via INTRAVENOUS
  Administered 2017-04-14 (×2): 25 ug via INTRAVENOUS

## 2017-04-14 MED ORDER — SODIUM CHLORIDE 0.9 % IV SOLN
INTRAVENOUS | Status: DC
  Administered 2017-04-14: 1000 mL via INTRAVENOUS
  Administered 2017-04-14: 12:00:00 via INTRAVENOUS
  Filled 2017-04-14: qty 1000

## 2017-04-14 MED ORDER — DEXTROSE 5 % IV SOLN
INTRAVENOUS | Status: AC
  Filled 2017-04-14: qty 50

## 2017-04-14 MED ORDER — PROPOFOL 10 MG/ML IV BOLUS
INTRAVENOUS | Status: AC
  Filled 2017-04-14: qty 20

## 2017-04-14 MED ORDER — SUCCINYLCHOLINE CHLORIDE 20 MG/ML IJ SOLN
INTRAMUSCULAR | Status: DC | PRN
  Administered 2017-04-14: 100 mg via INTRAVENOUS

## 2017-04-14 MED ORDER — MEPERIDINE HCL 25 MG/ML IJ SOLN
6.2500 mg | INTRAMUSCULAR | Status: DC | PRN
  Filled 2017-04-14: qty 1

## 2017-04-14 MED ORDER — LIDOCAINE HCL 2 % EX GEL
CUTANEOUS | Status: AC
  Filled 2017-04-14: qty 5

## 2017-04-14 MED ORDER — HYDROCODONE-ACETAMINOPHEN 10-325 MG PO TABS: 1.0000 | ORAL_TABLET | ORAL | 0 refills | Status: DC | PRN

## 2017-04-14 MED ORDER — SUCCINYLCHOLINE CHLORIDE 200 MG/10ML IV SOSY
PREFILLED_SYRINGE | INTRAVENOUS | Status: AC
  Filled 2017-04-14: qty 10

## 2017-04-14 MED ORDER — LIDOCAINE 2% (20 MG/ML) 5 ML SYRINGE
INTRAMUSCULAR | Status: AC
  Filled 2017-04-14: qty 5

## 2017-04-14 SURGICAL SUPPLY — 32 items
BAG DRAIN URO-CYSTO SKYTR STRL (DRAIN) ×2 IMPLANT
BAG DRN ANRFLXCHMBR STRAP LEK (BAG)
BAG DRN UROCATH (DRAIN) ×1
BAG URINE DRAINAGE (UROLOGICAL SUPPLIES) IMPLANT
BAG URINE LEG 19OZ MD ST LTX (BAG) IMPLANT
CATH FOLEY 2WAY SLVR  5CC 20FR (CATHETERS)
CATH FOLEY 2WAY SLVR  5CC 22FR (CATHETERS)
CATH FOLEY 2WAY SLVR  5CC 24FR (CATHETERS)
CATH FOLEY 2WAY SLVR 5CC 20FR (CATHETERS) IMPLANT
CATH FOLEY 2WAY SLVR 5CC 22FR (CATHETERS) IMPLANT
CATH FOLEY 2WAY SLVR 5CC 24FR (CATHETERS) ×1 IMPLANT
CATH FOLEY 3WAY 20FR (CATHETERS) IMPLANT
CLOTH BEACON ORANGE TIMEOUT ST (SAFETY) ×2 IMPLANT
ELECT BIVAP BIPO 22/24 DONUT (ELECTROSURGICAL) ×2
ELECT LOOP MED HF 24F 12D (CUTTING LOOP) ×1 IMPLANT
ELECT REM PT RETURN 9FT ADLT (ELECTROSURGICAL) ×2
ELECTRD BIVAP BIPO 22/24 DONUT (ELECTROSURGICAL) ×1 IMPLANT
ELECTRODE REM PT RTRN 9FT ADLT (ELECTROSURGICAL) ×1 IMPLANT
EVACUATOR MICROVAS BLADDER (UROLOGICAL SUPPLIES) ×1 IMPLANT
GLOVE BIO SURGEON STRL SZ8 (GLOVE) ×2 IMPLANT
GOWN STRL REUS W/ TWL LRG LVL3 (GOWN DISPOSABLE) ×1 IMPLANT
GOWN STRL REUS W/ TWL XL LVL3 (GOWN DISPOSABLE) ×1 IMPLANT
GOWN STRL REUS W/TWL LRG LVL3 (GOWN DISPOSABLE) ×2
GOWN STRL REUS W/TWL XL LVL3 (GOWN DISPOSABLE) ×2
HOLDER FOLEY CATH W/STRAP (MISCELLANEOUS) IMPLANT
IV NS IRRIG 3000ML ARTHROMATIC (IV SOLUTION) ×4 IMPLANT
KIT RM TURNOVER CYSTO AR (KITS) ×2 IMPLANT
MANIFOLD NEPTUNE II (INSTRUMENTS) ×2 IMPLANT
PACK CYSTO (CUSTOM PROCEDURE TRAY) ×2 IMPLANT
PLUG CATH AND CAP STER (CATHETERS) IMPLANT
TUBE CONNECTING 12X1/4 (SUCTIONS) ×2 IMPLANT
WATER STERILE IRR 3000ML UROMA (IV SOLUTION) IMPLANT

## 2017-04-14 NOTE — Op Note (Signed)
PATIENT:  Douglas Wood  PRE-OPERATIVE DIAGNOSIS: Bladder tumor  POST-OPERATIVE DIAGNOSIS: Same  PROCEDURE:  Procedure(s): 1. TRANSURETHRAL RESECTION OF BLADDER TUMOR (TURBT) (5.5cm.)  SURGEON:  Surgeon(s): Claybon Jabs  ANESTHESIA:   General  EBL:  less than 100 mL  DRAINS: Urethral catheter (20 Fr. Foley)   SPECIMEN:  Bladder tumor  DISPOSITION OF SPECIMEN:  PATHOLOGY  Indication: [name] is an 83 year old male who experienced painless gross hematuria and was evaluated with a CT scan which revealed no abnormality of the upper tract.  He was noted to have a solitary right kidney.  Cystoscopic evaluation revealed a very large tumor within the bladder involving the right floor and possibly right ureteral orifice.  He is brought to the operating room today for transurethral resection of his bladder tumor and possible intravesical instillation of chemotherapy with possible stent placement.  He understands and is elected to proceed.  Description of operation: The patient was taken to the operating room and administered general anesthesia. They were then placed on the table and moved to the dorsal lithotomy position after which the genitalia was sterilely prepped and draped. An official timeout was then performed.  Urethral meatus required dilatation with the [employer] sounds to 30 [place].  The 26 French resectoscope with the 30 lens and visual obturator were then passed into the bladder under direct visualization. Urethra appeared normal.  The prostatic urethra was elongated with bilobar hypertrophy.  The visual obturator was then removed and the Gyrus resectoscope element with 30  lens was then inserted and the bladder was fully and systematically inspected.  There was no left ureteral orifice.  The right ureteral orifice was noted to be uninvolved with papillary tumor.  The tumor involved the right wall of the bladder as well as the right posterior floor and posterior wall of the  bladder and measured at least 5.5 cm.  There was 4+ trabeculation of the bladder noted as well.  A shallow diverticulum was noted on the left floor of the bladder near the expected location of the left ureteral orifice.  I first began by resecting the tumor at its medial aspect resecting out laterally and then toward the floor of the bladder.  I was able to resect nearly all of the tumor however my concern was that due to his highly trabeculated bladder that the area is deep  within the trabeculation may harbor some papillary tumor and could not be resected easily.  I was able to resect all tumor that I could identify.  Reinspection of the bladder revealed all obvious tumor had been fully resected and there was no evidence of perforation. The Microvasive evacuator was then used to irrigate the bladder and remove all of the portions of bladder tumor which were sent to pathology. I then removed the resectoscope.  A 20 French Foley catheter was then inserted in the bladder and irrigated. The irrigant returned slightly pink with no clots. The patient was awakened and taken to the recovery room.  PLAN OF CARE: Discharge to home after PACU  PATIENT DISPOSITION:  PACU - hemodynamically stable.

## 2017-04-14 NOTE — Anesthesia Preprocedure Evaluation (Signed)
Anesthesia Evaluation  Patient identified by MRN, date of birth, ID band Patient awake    Reviewed: Allergy & Precautions, NPO status , Patient's Chart, lab work & pertinent test results  Airway Mallampati: II  TM Distance: >3 FB Neck ROM: Full    Dental no notable dental hx.    Pulmonary neg pulmonary ROS, former smoker,    Pulmonary exam normal breath sounds clear to auscultation       Cardiovascular hypertension, Pt. on medications negative cardio ROS Normal cardiovascular exam Rhythm:Regular Rate:Normal     Neuro/Psych negative neurological ROS  negative psych ROS   GI/Hepatic negative GI ROS, Neg liver ROS,   Endo/Other  negative endocrine ROS  Renal/GU Renal diseasenegative Renal ROS  negative genitourinary   Musculoskeletal negative musculoskeletal ROS (+) Arthritis , Osteoarthritis,    Abdominal   Peds negative pediatric ROS (+)  Hematology negative hematology ROS (+)   Anesthesia Other Findings   Reproductive/Obstetrics negative OB ROS                            Anesthesia Physical Anesthesia Plan  ASA: III  Anesthesia Plan: General   Post-op Pain Management:    Induction: Intravenous  PONV Risk Score and Plan: 2 and Treatment may vary due to age or medical condition, Ondansetron, Midazolam and Dexamethasone  Airway Management Planned: Oral ETT and LMA  Additional Equipment:   Intra-op Plan:   Post-operative Plan: Extubation in OR  Informed Consent: I have reviewed the patients History and Physical, chart, labs and discussed the procedure including the risks, benefits and alternatives for the proposed anesthesia with the patient or authorized representative who has indicated his/her understanding and acceptance.     Plan Discussed with: CRNA and Anesthesiologist  Anesthesia Plan Comments: (  )       Anesthesia Quick Evaluation

## 2017-04-14 NOTE — Anesthesia Postprocedure Evaluation (Signed)
Anesthesia Post Note  Patient: Douglas Wood  Procedure(s) Performed: TRANSURETHRAL RESECTION OF BLADDER TUMOR  (TURBT) (N/A )     Anesthesia Post Evaluation  Last Vitals:  Vitals:   04/14/17 1230 04/14/17 1245  BP: (!) 143/76 (!) 152/83  Pulse: 71 67  Resp: 16 13  Temp: (!) 35.9 C   SpO2: 100% 100%    Last Pain:  Vitals:   04/14/17 0944  TempSrc: Oral                 Graci Hulce

## 2017-04-14 NOTE — Anesthesia Procedure Notes (Signed)
Procedure Name: LMA Insertion Date/Time: 04/14/2017 10:38 AM Performed by: Suan Halter, CRNA Pre-anesthesia Checklist: Patient identified, Emergency Drugs available, Suction available and Patient being monitored Patient Re-evaluated:Patient Re-evaluated prior to induction Oxygen Delivery Method: Circle system utilized Preoxygenation: Pre-oxygenation with 100% oxygen Induction Type: IV induction Ventilation: Mask ventilation without difficulty LMA: LMA inserted LMA Size: 4.0 Number of attempts: 1 Airway Equipment and Method: Bite block Placement Confirmation: positive ETCO2 Tube secured with: Tape Dental Injury: Teeth and Oropharynx as per pre-operative assessment

## 2017-04-14 NOTE — Discharge Instructions (Signed)
Transurethral Resection of Bladder Tumor (TURBT)   Definition:  Transurethral Resection of the Bladder Tumor is a surgical procedure used to diagnose and remove tumors within the bladder. TURBT is the most common treatment for early stage bladder cancer.  General instructions:     Your recent bladder surgery requires very little post hospital care but some definite precautions.  Despite the fact that no skin incisions were used, the area around the bladder incisions are raw and covered with scabs to promote healing and prevent bleeding. Certain precautions are needed to insure that the scabs are not disturbed over the next 2-4 weeks while the healing proceeds.  Because the raw surface inside your bladder and the irritating effects of urine you may expect frequency of urination and/or urgency (a stronger desire to urinate) and perhaps even getting up at night more often. This will usually resolve or improve slowly over the healing period. You may see some blood in your urine over the first 6 weeks. Do not be alarmed, even if the urine was clear for a while. Get off your feet and drink lots of fluids until clearing occurs. If you start to pass clots or don't improve call us.  Catheter: (If you are discharged with a catheter.)  1. Keep your catheter secured to your leg at all times with tape or the supplied strap. 2. You may experience leakage of urine around your catheter- as long as the  catheter continues to drain, this is normal.  If your catheter stops draining  go to the ER. 3. You may also have blood in your urine, even after it has been clear for  several days; you may even pass some small blood clots or other material.  This  is normal as well.  If this happens, sit down and drink plenty of water to help  make urine to flush out your bladder.  If the blood in your urine becomes worse  after doing this, contact our office or return to the ER. 4. You may use the leg bag (small bag)  during the day, but use the large bag at  night.  Diet:  You may return to your normal diet immediately. Because of the raw surface of your bladder, alcohol, spicy foods, foods high in acid and drinks with caffeine may cause irritation or frequency and should be used in moderation. To keep your urine flowing freely and avoid constipation, drink plenty of fluids during the day (8-10 glasses). Tip: Avoid cranberry juice because it is very acidic.  Activity:  Your physical activity doesn't need to be restricted. However, if you are very active, you may see some blood in the urine. We suggest that you reduce your activity under the circumstances until the bleeding has stopped.  Bowels:  It is important to keep your bowels regular during the postoperative period. Straining with bowel movements can cause bleeding. A bowel movement every other day is reasonable. Use a mild laxative if needed, such as milk of magnesia 2-3 tablespoons, or 2 Dulcolax tablets. Call if you continue to have problems. If you had been taking narcotics for pain, before, during or after your surgery, you may be constipated. Take a laxative if necessary.    Medication:  You should resume your pre-surgery medications unless told not to. In addition you may be given an antibiotic to prevent or treat infection. Antibiotics are not always necessary. All medication should be taken as prescribed until the bottles are finished unless you are having  an unusual reaction to one of the drugs.   Post Anesthesia Home Care Instructions  Activity: Get plenty of rest for the remainder of the day. A responsible individual must stay with you for 24 hours following the procedure.  For the next 24 hours, DO NOT: -Drive a car -Operate machinery -Drink alcoholic beverages -Take any medication unless instructed by your physician -Make any legal decisions or sign important papers.  Meals: Start with liquid foods such as gelatin or soup.  Progress to regular foods as tolerated. Avoid greasy, spicy, heavy foods. If nausea and/or vomiting occur, drink only clear liquids until the nausea and/or vomiting subsides. Call your physician if vomiting continues.  Special Instructions/Symptoms: Your throat may feel dry or sore from the anesthesia or the breathing tube placed in your throat during surgery. If this causes discomfort, gargle with warm salt water. The discomfort should disappear within 24 hours.  If you had a scopolamine patch placed behind your ear for the management of post- operative nausea and/or vomiting:  1. The medication in the patch is effective for 72 hours, after which it should be removed.  Wrap patch in a tissue and discard in the trash. Wash hands thoroughly with soap and water. 2. You may remove the patch earlier than 72 hours if you experience unpleasant side effects which may include dry mouth, dizziness or visual disturbances. 3. Avoid touching the patch. Wash your hands with soap and water after contact with the patch.        

## 2017-04-14 NOTE — Transfer of Care (Signed)
Immediate Anesthesia Transfer of Care Note  Patient: Douglas Wood  Procedure(s) Performed: TRANSURETHRAL RESECTION OF BLADDER TUMOR  (TURBT) (N/A )  Patient Location: PACU  Anesthesia Type:General  Level of Consciousness: awake, alert  and oriented  Airway & Oxygen Therapy: Patient Spontanous Breathing and Patient connected to nasal cannula oxygen  Post-op Assessment: Report given to RN  Post vital signs: Reviewed and stable  Last Vitals: 141/94, 66, 24, 100% Vitals:   04/14/17 0944  BP: (!) 142/74  Pulse: 84  Resp: 16  Temp: 36.6 C  SpO2: 100%    Last Pain:  Vitals:   04/14/17 0944  TempSrc: Oral      Patients Stated Pain Goal: 4 (02/23/27 1188)  Complications: No apparent anesthesia complications

## 2017-04-15 ENCOUNTER — Emergency Department (HOSPITAL_COMMUNITY)
Admission: EM | Admit: 2017-04-15 | Discharge: 2017-04-15 | Disposition: A | Discharge status: Home / Self Care | Payer: Medicare Other | Attending: Emergency Medicine | Admitting: Emergency Medicine

## 2017-04-15 ENCOUNTER — Encounter (HOSPITAL_COMMUNITY): Payer: Self-pay | Admitting: *Deleted

## 2017-04-15 DIAGNOSIS — Y829 Unspecified medical devices associated with adverse incidents: Secondary | ICD-10-CM | POA: Insufficient documentation

## 2017-04-15 DIAGNOSIS — I10 Essential (primary) hypertension: Secondary | ICD-10-CM | POA: Insufficient documentation

## 2017-04-15 DIAGNOSIS — T83091A Other mechanical complication of indwelling urethral catheter, initial encounter: Secondary | ICD-10-CM | POA: Diagnosis not present

## 2017-04-15 DIAGNOSIS — Z79899 Other long term (current) drug therapy: Secondary | ICD-10-CM | POA: Insufficient documentation

## 2017-04-15 DIAGNOSIS — E785 Hyperlipidemia, unspecified: Secondary | ICD-10-CM | POA: Diagnosis not present

## 2017-04-15 DIAGNOSIS — R339 Retention of urine, unspecified: Secondary | ICD-10-CM | POA: Insufficient documentation

## 2017-04-15 DIAGNOSIS — Z87891 Personal history of nicotine dependence: Secondary | ICD-10-CM | POA: Insufficient documentation

## 2017-04-15 DIAGNOSIS — T839XXA Unspecified complication of genitourinary prosthetic device, implant and graft, initial encounter: Secondary | ICD-10-CM

## 2017-04-15 NOTE — Discharge Instructions (Signed)
Push fluids. Return to ER with any recurrence.

## 2017-04-15 NOTE — ED Triage Notes (Addendum)
Pt here for possible blocked foley catheter. Pt had TURP yesterday and had a foley placed. Pt states he has not noticed any urine output since 2AM. Pt states he noticed a blood clot in the bag. Pt states he has fullness in his bladder. Bladder scan showed 562ml in bladder.

## 2017-04-15 NOTE — ED Provider Notes (Signed)
[facility] DEPT Provider Note   CSN: [id number] Arrival date & time: 04/15/17  0801     History   Chief Complaint Chief Complaint  Patient presents with  . foley catheter issue  . Urinary Retention    552ml on bladder scan    HPI Douglas Wood is a 83 y.o. male. Complaint is Foley catheter isn't draining  HPI:  84 year old male. Underwent transurethral resection of bladder tumor with Dr. Karsten Ro yesterday. Has Foley catheter in place. Was draining well yesterday. Became bloody last night. He awakened this morning at about 03:00. He has not had any urine output and feels distention and discomfort in the suprapubic abdomen.  Past Medical History:  Diagnosis Date  . Arthritis   . Bladder tumor   . Congenital absence of left kidney   . Dysuria   . GERD (gastroesophageal reflux disease)   . History of basal cell carcinoma excision    hx multiple BCC of skin removals  . History of chronic prostatitis UROLOGIST-  DR Consuella Lose  . History of kidney stones   . History of squamous cell carcinoma excision    multiple SCC of skin removals  . Hyperlipidemia   . Hypertension   . IBS (irritable bowel syndrome)   . Solitary kidney    LEFT    Patient Active Problem List   Diagnosis Date Noted  . Lymphadenopathy of left cervical region 05/30/2013  . Left foot pain 10/15/2012  . ABNORMALITY OF GAIT 03/03/2010  . HIP PAIN, RIGHT 02/09/2010  . ITBS, LEFT KNEE 09/02/2009  . OTHER ACQUIRED DEFORMITY OF ANKLE AND FOOT OTHER 09/02/2009    Past Surgical History:  Procedure Laterality Date  . EXCISION BENIGN LYMPH NODE LEFT CERCIAL REGION  09-11-2013    dr toth  [facility]  . HEMORROIDECTOMY  05/30/2005  . RIGHT URETEROSCOPY W/ STONE EXTRACTION AND STENT PLACEMENT  04-04-2008    dr Everlene Balls  [place] Medications    Prior to Admission medications   Medication Sig Start Date End Date Taking? Authorizing Provider  diazepam (VALIUM) 5 MG tablet Take  2.5-5 mg by mouth at bedtime as needed for anxiety. Takes 2.5 mg in the morning and 5 mg at bedtime as needed.    [provider]  fish oil-omega-3 fatty acids 1000 MG capsule Take 1 g by mouth 2 (two) times daily.     [provider]  HYDROcodone-acetaminophen (NORCO) 10-325 MG tablet Take 1-2 tablets by mouth every 4 (four) hours as needed for moderate pain. Maximum dose per 24 hours - 8 pills 04/14/17   Kathie Rhodes, MD  losartan-hydrochlorothiazide (HYZAAR) 50-12.5 MG per tablet Take 1 tablet by mouth every evening.     [provider]  omeprazole (PRILOSEC) 20 MG capsule Take 20 mg by mouth 2 (two) times daily.     [provider]  phenazopyridine (PYRIDIUM) 200 MG tablet Take 1 tablet (200 mg total) by mouth 3 (three) times daily as needed for pain. 04/14/17   Kathie Rhodes, MD  silodosin (RAPAFLO) 8 MG CAPS capsule Take 8 mg by mouth every evening.    [provider]  simvastatin (ZOCOR) 10 MG tablet Take 10 mg by mouth every evening. 07/29/14   [provider]    Family History No family history on file.  Social History Social History   Tobacco Use  . Smoking status: Former Smoker    Packs/day: 1.00    Years: 10.00  Pack years: 10.00    Types: Cigarettes    Last attempt to quit: 04/11/1964    Years since quitting: 53.0  . Smokeless tobacco: Never Used  Substance Use Topics  . Alcohol use: Yes    Alcohol/week: 8.4 oz    Types: 14 Glasses of wine per week    Comment: nightly 1-2 glasses of wine  . Drug use: No     Allergies   Levaquin [levofloxacin in d5w] and Oxycodone-acetaminophen   Review of Systems Review of Systems  Constitutional: Negative for appetite change, chills, diaphoresis, fatigue and fever.  HENT: Negative for mouth sores, sore throat and trouble swallowing.   Eyes: Negative for visual disturbance.  Respiratory: Negative for cough, chest tightness, shortness of breath and wheezing.     Cardiovascular: Negative for chest pain.  Gastrointestinal: Negative for abdominal distention, abdominal pain, diarrhea, nausea and vomiting.  Endocrine: Negative for polydipsia, polyphagia and polyuria.  Genitourinary: Positive for decreased urine volume and difficulty urinating. Negative for dysuria, frequency and hematuria.  Musculoskeletal: Negative for gait problem.  Skin: Negative for color change, pallor and rash.  Neurological: Negative for dizziness, syncope, light-headedness and headaches.  Hematological: Does not bruise/bleed easily.  Psychiatric/Behavioral: Negative for behavioral problems and confusion.     Physical Exam Updated Vital Signs BP (!) 145/90 (BP Location: Right Arm)   Pulse 99   Temp 97.8 F (36.6 C) (Oral)   Resp 16   SpO2 96%   Physical Exam  Constitutional: He is oriented to person, place, and time. He appears well-developed and well-nourished. No distress.  HENT:  Head: Normocephalic.  Eyes: Conjunctivae are normal. Pupils are equal, round, and reactive to light. No scleral icterus.  Neck: Normal range of motion. Neck supple. No thyromegaly present.  Cardiovascular: Normal rate and regular rhythm. Exam reveals no gallop and no friction rub.  No murmur heard. Pulmonary/Chest: Effort normal and breath sounds normal. No respiratory distress. He has no wheezes. He has no rales.  Abdominal: Soft. Bowel sounds are normal. He exhibits no distension. There is no tenderness. There is no rebound.  Distention of the urinary bladder palpable with tenderness in the suprapubic abdomen. Bedside ultrasound confirms distention of the bladder.  Musculoskeletal: Normal range of motion.  Neurological: He is alert and oriented to person, place, and time.  Skin: Skin is warm and dry. No rash noted.  Psychiatric: He has a normal mood and affect. His behavior is normal.     ED Treatments / Results  Labs (all labs ordered are listed, but only abnormal results are  displayed) Labs Reviewed - No data to display  EKG  EKG Interpretation None       Radiology No results found.  Procedures Procedures (including critical care time)  Medications Ordered in ED Medications - No data to display   Initial Impression / Assessment and Plan / ED Course  I have reviewed the triage vital signs and the nursing notes.  Pertinent labs & imaging results that were available during my care of the patient were reviewed by me and considered in my medical decision making (see chart for details).    Irrigation of the Foley catheter was done by myself using aseptic technique. This produces one solitary long thin clot. This is followed by spontaneous drainage of an additional 4-500 mL of bloody urine. It was irrigated with 300 with a clear effluent. He is replaced to his leg bag and continues to have spontaneous drainage to gravity.  Final Clinical Impressions(s) /  ED Diagnoses   Final diagnoses:  Urinary retention  Problem with Foley catheter, initial encounter [facility])    ED Discharge Orders    None       Tanna Furry, MD 04/15/17 [phone]

## 2017-04-15 NOTE — ED Notes (Signed)
Bladder Scan done in triage (523ml)

## 2017-04-19 ENCOUNTER — Encounter (HOSPITAL_BASED_OUTPATIENT_CLINIC_OR_DEPARTMENT_OTHER): Payer: Self-pay | Admitting: Urology

## 2017-04-27 ENCOUNTER — Telehealth: Payer: Self-pay | Admitting: Oncology

## 2017-04-27 NOTE — Telephone Encounter (Signed)
Spoke with patient regarding appointment D/T/Loc/Ph# also called referring MD office with D/T

## 2017-05-10 ENCOUNTER — Encounter (HOSPITAL_BASED_OUTPATIENT_CLINIC_OR_DEPARTMENT_OTHER): Payer: Self-pay | Admitting: *Deleted

## 2017-05-10 ENCOUNTER — Other Ambulatory Visit: Payer: Self-pay

## 2017-05-10 ENCOUNTER — Other Ambulatory Visit: Payer: Self-pay | Admitting: Urology

## 2017-05-10 NOTE — Progress Notes (Signed)
SPOKE W/ PT VIA PHONE FOR PRE-OP INTERVIEW.  NPO AFTER MN.  ARRIVE AT 0630.  NEEDS ISTAT 8.  CURRENT EKG IN CHART AND Epic.  WILL TAKE PRILOSEC AM DOS W/ SIPS OF WATER.

## 2017-05-12 ENCOUNTER — Telehealth: Payer: Self-pay | Admitting: Oncology

## 2017-05-12 ENCOUNTER — Inpatient Hospital Stay: Payer: Medicare Other | Attending: Oncology | Admitting: Oncology

## 2017-05-12 DIAGNOSIS — C679 Malignant neoplasm of bladder, unspecified: Secondary | ICD-10-CM | POA: Diagnosis not present

## 2017-05-12 DIAGNOSIS — C672 Malignant neoplasm of lateral wall of bladder: Secondary | ICD-10-CM | POA: Insufficient documentation

## 2017-05-12 MED ORDER — PROCHLORPERAZINE MALEATE 10 MG PO TABS: 10.0000 mg | ORAL_TABLET | Freq: Four times a day (QID) | ORAL | 0 refills | Status: DC | PRN

## 2017-05-12 NOTE — Progress Notes (Signed)
Reason for Referral: Bladder cancer.  HPI: 84 year old gentleman currently of [place] where he lived the majority of his life.  He is a gentleman with no significant comorbid conditions except for mild hypertension and hyperlipidemia.  He started developing hematuria in November 2018.  He was evaluated by Dr. Karsten Ro and subsequently had a CT scan done in November 2018 showed a 4.8 cm right bladder mass near the right ureteral orifice without any finding of metastatic disease.  He subsequently underwent TURBT on April 14, 2017 which showed a high-grade invasive urothelial carcinoma with muscularis propria involvement.  Treatment options was given to the patient and he elected to proceed with definitive radiation therapy concomitantly with chemotherapy.  He deferred the primary surgical option at this time.  Clinically reports no complaints related to his bladder cancer.  He denies abdominal pain, pelvic pain or recurrent hematuria.  He had discomfort associated with the catheter after his TURBT.  He denies any back pain or flank pain.  His performance status remain excellent and continues to attend to activities of daily living.  He does not report any headaches, blurry vision, syncope or seizures. Does not report any fevers, chills or sweats.  Does not report any cough, wheezing or hemoptysis.  Does not report any chest pain, palpitation, orthopnea or leg edema.  Does not report any nausea, vomiting or abdominal pain.  She does not report any constipation or diarrhea.  Does not report any skeletal complaints.   Does not report any skin rashes or lesions. Does not report any heat or cold intolerance.  Does not report any lymphadenopathy or petechiae.  Does not report any anxiety or depression.  Remaining review of systems is negative.     Past Medical History:  Diagnosis Date  . Arthritis   . Bladder cancer [facility]) urologist-  dr Karsten Ro   dx 04-14-2017 per high grade invasive urothelial  carcinoma  . Congenital absence of left kidney   . Dysuria   . GERD (gastroesophageal reflux disease)   . History of basal cell carcinoma excision    hx multiple BCC of skin removals  . History of chronic prostatitis UROLOGIST-  DR Consuella Lose  . History of kidney stones   . History of squamous cell carcinoma excision    multiple SCC of skin removals  . Hyperlipidemia   . Hypertension   . IBS (irritable bowel syndrome)   . Solitary kidney    LEFT  :  Past Surgical History:  Procedure Laterality Date  . EXCISION BENIGN LYMPH NODE LEFT CERCIAL REGION  09-11-2013    dr toth  [facility]  . HEMORROIDECTOMY  05/30/2005  . RIGHT URETEROSCOPY W/ STONE EXTRACTION AND STENT PLACEMENT  04-04-2008    dr Everlene Balls  [place]  . TRANSURETHRAL RESECTION OF BLADDER TUMOR N/A 04/14/2017   Procedure: TRANSURETHRAL RESECTION OF BLADDER TUMOR  (TURBT);  Surgeon: Kathie Rhodes, MD;  Location: [facility];  Service: Urology;  Laterality: N/A;  :   Current Outpatient Medications:  .  diazepam (VALIUM) 5 MG tablet, Take 2.5-5 mg by mouth at bedtime as needed for anxiety. Takes 2.5 mg in the morning and 5 mg at bedtime as needed., Disp: , Rfl:  .  losartan-hydrochlorothiazide (HYZAAR) 50-12.5 MG per tablet, Take 1 tablet by mouth every evening. , Disp: , Rfl:  .  omeprazole (PRILOSEC) 20 MG capsule, Take 20 mg by mouth 2 (two) times daily. , Disp: , Rfl:  .  silodosin (RAPAFLO) 8 MG CAPS capsule, Take  8 mg by mouth every evening., Disp: , Rfl:  .  simvastatin (ZOCOR) 10 MG tablet, Take 10 mg by mouth every evening., Disp: , Rfl: 0 .  prochlorperazine (COMPAZINE) 10 MG tablet, Take 1 tablet (10 mg total) by mouth every 6 (six) hours as needed for nausea or vomiting., Disp: 30 tablet, Rfl: 0:  Allergies  Allergen Reactions  . Levaquin [Levofloxacin In D5w]     Joint pain  . Oxycodone-Acetaminophen     Causes vertigo  :  No family history on file.:  Social History   Socioeconomic History  .  Marital status: Married    Spouse name: Not on file  . Number of children: Not on file  . Years of education: Not on file  . Highest education level: Not on file  Social Needs  . Financial resource strain: Not on file  . Food insecurity - worry: Not on file  . Food insecurity - inability: Not on file  . Transportation needs - medical: Not on file  . Transportation needs - non-medical: Not on file  Occupational History  . Not on file  Tobacco Use  . Smoking status: Former Smoker    Packs/day: 1.00    Years: 10.00    Pack years: 10.00    Types: Cigarettes    Last attempt to quit: 04/11/1964    Years since quitting: 53.1  . Smokeless tobacco: Never Used  Substance and Sexual Activity  . Alcohol use: Yes    Alcohol/week: 8.4 oz    Types: 14 Glasses of wine per week    Comment: nightly 1-2 glasses of wine  . Drug use: No  . Sexual activity: Not on file  Other Topics Concern  . Not on file  Social History Narrative  . Not on file  :  Pertinent items are noted in HPI.  Exam: Blood pressure 126/70, pulse 76, temperature 97.6 F (36.4 C), temperature source Oral, resp. rate 18, height 5\' 10"  (1.778 m), weight 154 lb (69.9 kg), SpO2 99 %.  ECOG 0 General appearance: alert and cooperative without distress. Eyes: conjunctivae/corneas clear.  Throat: lips, mucosa, and tongue normal; without thrush. Neck: no adenopathy Resp: clear to auscultation bilaterally without rhonchi or wheezes. Cardio: regular rate and rhythm, S1, S2 normal, no murmur, click, rub or gallop GI: soft, non-tender; bowel sounds normal; no masses,  no organomegaly Musculoskeletal: No joint deformity or effusions. Skin: Skin color, texture, turgor normal. No rashes or lesions Lymph nodes: Cervical, supraclavicular, and axillary nodes normal. Neurologic: Grossly normal without any motor or sensory deficits.  CBC    Component Value Date/Time   HGB 15.3 04/14/2017 1003   HCT 45.0 04/14/2017 1003      Chemistry      Component Value Date/Time   NA 143 04/14/2017 1003   K 4.2 04/14/2017 1003   CL 105 04/14/2017 1003   BUN 21 (H) 04/14/2017 1003   CREATININE 0.80 04/14/2017 1003   No results found for: CALCIUM, ALKPHOS, AST, ALT, BILITOT     Assessment and Plan:   84 year old gentleman with the following issues:  1.  High-grade invasive urothelial carcinoma arising from the bladder diagnosed in December 2018.  He underwent TURBT on April 14, 2017 under the care of Dr. Karsten Ro and found to have a tumor measuring at least 5.5 cm involving the right wall of the bladder as well as the right posterior floor and the posterior wall of the bladder.  The final pathology showed high-grade carcinoma with  muscle invasion.  The natural course of this disease was reviewed today with the patient and his family.  Treatment options were also discussed extensively today.  He would be an excellent candidate for primary surgical therapy after neoadjuvant cisplatin based chemotherapy.  I explained to him that this is the standard of care and really the gold standard to treat this malignancy.  He is interested in bladder preservation and would like to defer surgical option.    Logistics of treating him with definitive radiation therapy and weekly chemotherapy instead was discussed today.  Complication associated with this modality were reviewed which include fatigue, tiredness, dysuria and potentially diarrhea.  Complication associated with platinum based therapy include nausea, vomiting, myelosuppression, peripheral neuropathy among others.  The goal of therapy would be curative in this setting.  After discussion today he is agreeable to proceed with this approach.  We will make the appropriate referral to radiation oncology and anticipate starting in February 2019.  He is scheduled to have another TURBT in anticipation to start radiation therapy in the near future.  2.  IV access: Peripheral veins will be  used at this time.  Risks and benefits of Port-A-Cath insertion was reviewed today.  We will defer this option for the time being.  3.  Nausea prophylaxis: Prescription for Compazine was made available to the patient today.  4.  Kidney function surveillance: His baseline creatinine appears adequate.  I have recommended avoiding nephrotoxic agents such as nonsteroidal anti-inflammatories.  60 minutes was spent with the patient face-to-face today.  More than 50% of time was dedicated to patient counseling, education and coordination of the patient's multifaceted care.  Including discussion about the natural course of this disease as well as the different treatment options and success rate.

## 2017-05-12 NOTE — Progress Notes (Signed)
START OFF PATHWAY REGIMEN - Bladder   [id number]:Carboplatin AUC=2:   Administer weekly:     Carboplatin   **Always confirm dose/schedule in your pharmacy ordering system**    Patient Characteristics: Pre Cystectomy, Clinical T2-T4a, N0-1, M0, Cystectomy Ineligible/Patient Refuses Cystectomy AJCC M Category: M0 AJCC N Category: N0 AJCC T Category: T2 Current evidence of distant metastases? No AJCC 8 Stage Grouping: II Intent of Therapy: Curative Intent, Discussed with Patient 

## 2017-05-12 NOTE — Telephone Encounter (Signed)
Gave patient avs and calendar with appts per 11/8 los.  °

## 2017-05-16 ENCOUNTER — Encounter: Payer: Self-pay | Admitting: *Deleted

## 2017-05-16 ENCOUNTER — Inpatient Hospital Stay: Payer: Medicare Other

## 2017-05-22 ENCOUNTER — Encounter (HOSPITAL_BASED_OUTPATIENT_CLINIC_OR_DEPARTMENT_OTHER)
Admission: RE | Disposition: A | Discharge status: Home / Self Care | Payer: Self-pay | Source: Ambulatory Visit | Attending: Urology

## 2017-05-22 ENCOUNTER — Ambulatory Visit (HOSPITAL_BASED_OUTPATIENT_CLINIC_OR_DEPARTMENT_OTHER): Payer: Medicare Other | Admitting: Anesthesiology

## 2017-05-22 ENCOUNTER — Encounter (HOSPITAL_BASED_OUTPATIENT_CLINIC_OR_DEPARTMENT_OTHER): Payer: Self-pay

## 2017-05-22 ENCOUNTER — Ambulatory Visit (HOSPITAL_BASED_OUTPATIENT_CLINIC_OR_DEPARTMENT_OTHER)
Admission: RE | Admit: 2017-05-22 | Discharge: 2017-05-22 | Disposition: A | Discharge status: Home / Self Care | Payer: Medicare Other | Source: Ambulatory Visit | Attending: Urology | Admitting: Urology

## 2017-05-22 DIAGNOSIS — C678 Malignant neoplasm of overlapping sites of bladder: Secondary | ICD-10-CM

## 2017-05-22 DIAGNOSIS — I10 Essential (primary) hypertension: Secondary | ICD-10-CM | POA: Insufficient documentation

## 2017-05-22 DIAGNOSIS — C674 Malignant neoplasm of posterior wall of bladder: Secondary | ICD-10-CM | POA: Diagnosis not present

## 2017-05-22 DIAGNOSIS — K219 Gastro-esophageal reflux disease without esophagitis: Secondary | ICD-10-CM | POA: Diagnosis not present

## 2017-05-22 DIAGNOSIS — R310 Gross hematuria: Secondary | ICD-10-CM | POA: Diagnosis not present

## 2017-05-22 DIAGNOSIS — Z79899 Other long term (current) drug therapy: Secondary | ICD-10-CM | POA: Insufficient documentation

## 2017-05-22 DIAGNOSIS — Z87891 Personal history of nicotine dependence: Secondary | ICD-10-CM | POA: Insufficient documentation

## 2017-05-22 HISTORY — DX: Malignant neoplasm of bladder, unspecified: C67.9

## 2017-05-22 HISTORY — PX: TRANSURETHRAL RESECTION OF BLADDER TUMOR: SHX2575

## 2017-05-22 LAB — POCT I-STAT, CHEM 8
BUN: 26 mg/dL — ABNORMAL HIGH (ref 6–20)
Calcium, Ion: 1.24 mmol/L (ref 1.15–1.40)
Chloride: 104 mmol/L (ref 101–111)
Creatinine, Ser: 0.9 mg/dL (ref 0.61–1.24)
Glucose, Bld: 94 mg/dL (ref 65–99)
HCT: 42 % (ref 39.0–52.0)
Hemoglobin: 14.3 g/dL (ref 13.0–17.0)
Potassium: 4.4 mmol/L (ref 3.5–5.1)
Sodium: 142 mmol/L (ref 135–145)
TCO2: 28 mmol/L (ref 22–32)

## 2017-05-22 SURGERY — TURBT (TRANSURETHRAL RESECTION OF BLADDER TUMOR)
Anesthesia: General | Site: Bladder

## 2017-05-22 MED ORDER — KETOROLAC TROMETHAMINE 30 MG/ML IJ SOLN
INTRAMUSCULAR | Status: AC
  Filled 2017-05-22: qty 1

## 2017-05-22 MED ORDER — ONDANSETRON HCL 4 MG/2ML IJ SOLN
INTRAMUSCULAR | Status: AC
  Filled 2017-05-22: qty 2

## 2017-05-22 MED ORDER — SODIUM CHLORIDE 0.9 % IV SOLN
INTRAVENOUS | Status: DC
  Administered 2017-05-22: 07:00:00 via INTRAVENOUS
  Filled 2017-05-22: qty 1000

## 2017-05-22 MED ORDER — FENTANYL CITRATE (PF) 100 MCG/2ML IJ SOLN
INTRAMUSCULAR | Status: AC
Start: 2017-05-22 — End: 2017-05-22
  Filled 2017-05-22: qty 2

## 2017-05-22 MED ORDER — TRAMADOL HCL 50 MG PO TABS: 50.0000 mg | ORAL_TABLET | Freq: Four times a day (QID) | ORAL | 0 refills | Status: DC | PRN

## 2017-05-22 MED ORDER — LIDOCAINE 2% (20 MG/ML) 5 ML SYRINGE
INTRAMUSCULAR | Status: DC | PRN
  Administered 2017-05-22: 100 mg via INTRAVENOUS

## 2017-05-22 MED ORDER — DEXAMETHASONE SODIUM PHOSPHATE 10 MG/ML IJ SOLN
INTRAMUSCULAR | Status: AC
  Filled 2017-05-22: qty 1

## 2017-05-22 MED ORDER — CIPROFLOXACIN IN D5W 400 MG/200ML IV SOLN
400.0000 mg | Freq: Once | INTRAVENOUS | Status: AC
  Administered 2017-05-22: 400 mg via INTRAVENOUS
  Filled 2017-05-22: qty 200

## 2017-05-22 MED ORDER — FENTANYL CITRATE (PF) 100 MCG/2ML IJ SOLN
INTRAMUSCULAR | Status: DC | PRN
  Administered 2017-05-22: 50 ug via INTRAVENOUS
  Administered 2017-05-22: 25 ug via INTRAVENOUS

## 2017-05-22 MED ORDER — FENTANYL CITRATE (PF) 100 MCG/2ML IJ SOLN
25.0000 ug | INTRAMUSCULAR | Status: DC | PRN
  Filled 2017-05-22: qty 1

## 2017-05-22 MED ORDER — PROPOFOL 10 MG/ML IV BOLUS
INTRAVENOUS | Status: DC | PRN
  Administered 2017-05-22: 150 mg via INTRAVENOUS

## 2017-05-22 MED ORDER — SUCCINYLCHOLINE CHLORIDE 20 MG/ML IJ SOLN
INTRAMUSCULAR | Status: DC | PRN
  Administered 2017-05-22: 60 mg via INTRAVENOUS

## 2017-05-22 MED ORDER — ONDANSETRON HCL 4 MG/2ML IJ SOLN
INTRAMUSCULAR | Status: DC | PRN
  Administered 2017-05-22: 4 mg via INTRAVENOUS

## 2017-05-22 MED ORDER — DEXAMETHASONE SODIUM PHOSPHATE 10 MG/ML IJ SOLN
INTRAMUSCULAR | Status: DC | PRN
  Administered 2017-05-22: 10 mg via INTRAVENOUS

## 2017-05-22 MED ORDER — CIPROFLOXACIN IN D5W 400 MG/200ML IV SOLN
INTRAVENOUS | Status: AC
  Filled 2017-05-22: qty 200

## 2017-05-22 MED ORDER — PHENAZOPYRIDINE HCL 200 MG PO TABS: 200.0000 mg | ORAL_TABLET | Freq: Three times a day (TID) | ORAL | 0 refills | Status: DC | PRN

## 2017-05-22 MED ORDER — PROPOFOL 10 MG/ML IV BOLUS
INTRAVENOUS | Status: AC
Start: 2017-05-22 — End: 2017-05-22
  Filled 2017-05-22: qty 20

## 2017-05-22 SURGICAL SUPPLY — 32 items
BAG DRAIN URO-CYSTO SKYTR STRL (DRAIN) ×2 IMPLANT
BAG DRN ANRFLXCHMBR STRAP LEK (BAG)
BAG DRN UROCATH (DRAIN) ×1
BAG URINE DRAINAGE (UROLOGICAL SUPPLIES) IMPLANT
BAG URINE LEG 19OZ MD ST LTX (BAG) IMPLANT
CATH FOLEY 2WAY SLVR  5CC 20FR (CATHETERS)
CATH FOLEY 2WAY SLVR  5CC 22FR (CATHETERS)
CATH FOLEY 2WAY SLVR  5CC 24FR (CATHETERS) ×1
CATH FOLEY 2WAY SLVR 5CC 20FR (CATHETERS) IMPLANT
CATH FOLEY 2WAY SLVR 5CC 22FR (CATHETERS) IMPLANT
CATH FOLEY 2WAY SLVR 5CC 24FR (CATHETERS) ×1 IMPLANT
CATH FOLEY 3WAY 20FR (CATHETERS) IMPLANT
CLOTH BEACON ORANGE TIMEOUT ST (SAFETY) ×2 IMPLANT
ELECT BIVAP BIPO 22/24 DONUT (ELECTROSURGICAL) ×2
ELECT LOOP MED HF 24F 12D (CUTTING LOOP) ×1 IMPLANT
ELECT REM PT RETURN 9FT ADLT (ELECTROSURGICAL) ×2
ELECTRD BIVAP BIPO 22/24 DONUT (ELECTROSURGICAL) ×1 IMPLANT
ELECTRODE REM PT RTRN 9FT ADLT (ELECTROSURGICAL) ×1 IMPLANT
EVACUATOR MICROVAS BLADDER (UROLOGICAL SUPPLIES) IMPLANT
GLOVE BIO SURGEON STRL SZ8 (GLOVE) ×2 IMPLANT
GOWN STRL REUS W/ TWL LRG LVL3 (GOWN DISPOSABLE) ×1 IMPLANT
GOWN STRL REUS W/ TWL XL LVL3 (GOWN DISPOSABLE) ×1 IMPLANT
GOWN STRL REUS W/TWL LRG LVL3 (GOWN DISPOSABLE) ×2
GOWN STRL REUS W/TWL XL LVL3 (GOWN DISPOSABLE) ×2
HOLDER FOLEY CATH W/STRAP (MISCELLANEOUS) ×1 IMPLANT
IV NS IRRIG 3000ML ARTHROMATIC (IV SOLUTION) ×2 IMPLANT
KIT RM TURNOVER CYSTO AR (KITS) ×2 IMPLANT
MANIFOLD NEPTUNE II (INSTRUMENTS) ×2 IMPLANT
PACK CYSTO (CUSTOM PROCEDURE TRAY) ×2 IMPLANT
PLUG CATH AND CAP STER (CATHETERS) IMPLANT
TUBE CONNECTING 12X1/4 (SUCTIONS) ×2 IMPLANT
WATER STERILE IRR 3000ML UROMA (IV SOLUTION) IMPLANT

## 2017-05-22 NOTE — Transfer of Care (Signed)
Immediate Anesthesia Transfer of Care Note  Patient: Douglas Wood  Procedure(s) Performed: TRANSURETHRAL RESECTION OF BLADDER TUMOR (TURBT) (N/A Bladder)  Patient Location: PACU  Anesthesia Type:General  Level of Consciousness: awake, alert , oriented and patient cooperative  Airway & Oxygen Therapy: Patient Spontanous Breathing and Patient connected to nasal cannula oxygen  Post-op Assessment: Report given to RN and Post -op Vital signs reviewed and stable  Post vital signs: Reviewed and stable  Last Vitals:  Vitals:   05/22/17 0644  BP: (!) 149/79  Pulse: 71  Resp: 16  Temp: 36.4 C  SpO2: 98%    Last Pain:  Vitals:   05/22/17 0644  TempSrc: Oral      Patients Stated Pain Goal: 5 (84/21/03 1281)  Complications: No apparent anesthesia complications

## 2017-05-22 NOTE — Anesthesia Preprocedure Evaluation (Addendum)
Anesthesia Evaluation  Patient identified by MRN, date of birth, ID band Patient awake    Reviewed: Allergy & Precautions, NPO status , Patient's Chart, lab work & pertinent test results  History of Anesthesia Complications Negative for: history of anesthetic complications  Airway Mallampati: II  TM Distance: >3 FB Neck ROM: Full    Dental no notable dental hx. (+) Dental Advisory Given, Teeth Intact,    Pulmonary neg pulmonary ROS, former smoker,    Pulmonary exam normal breath sounds clear to auscultation       Cardiovascular hypertension, Pt. on medications negative cardio ROS Normal cardiovascular exam Rhythm:Regular Rate:Normal     Neuro/Psych negative neurological ROS  negative psych ROS   GI/Hepatic negative GI ROS, Neg liver ROS, GERD  Controlled,  Endo/Other  negative endocrine ROS  Renal/GU Renal diseasenegative Renal ROSBladder CA  negative genitourinary   Musculoskeletal negative musculoskeletal ROS (+) Arthritis , Osteoarthritis,    Abdominal   Peds negative pediatric ROS (+)  Hematology negative hematology ROS (+)   Anesthesia Other Findings   Reproductive/Obstetrics negative OB ROS                           Anesthesia Physical  Anesthesia Plan  ASA: III  Anesthesia Plan: General   Post-op Pain Management:    Induction: Intravenous  PONV Risk Score and Plan: 2 and 3 and Treatment may vary due to age or medical condition, Ondansetron, Dexamethasone and Diphenhydramine  Airway Management Planned: LMA  Additional Equipment:   Intra-op Plan:   Post-operative Plan: Extubation in OR  Informed Consent:   Dental advisory given  Plan Discussed with: CRNA and Anesthesiologist  Anesthesia Plan Comments: (  )        Anesthesia Quick Evaluation

## 2017-05-22 NOTE — Anesthesia Procedure Notes (Signed)
Procedure Name: LMA Insertion Date/Time: 05/22/2017 8:35 AM Performed by: Wanita Chamberlain, CRNA Pre-anesthesia Checklist: Patient identified, Timeout performed, Emergency Drugs available, Suction available and Patient being monitored Patient Re-evaluated:Patient Re-evaluated prior to induction Oxygen Delivery Method: Circle system utilized Preoxygenation: Pre-oxygenation with 100% oxygen Induction Type: IV induction Ventilation: Mask ventilation without difficulty LMA: LMA inserted LMA Size: 4.0 Number of attempts: 1

## 2017-05-22 NOTE — Anesthesia Postprocedure Evaluation (Signed)
Anesthesia Post Note  Patient: Douglas Wood  Procedure(s) Performed: TRANSURETHRAL RESECTION OF BLADDER TUMOR (TURBT) (N/A Bladder)     Patient location during evaluation: PACU Anesthesia Type: General Level of consciousness: sedated Pain management: pain level controlled Vital Signs Assessment: post-procedure vital signs reviewed and stable Respiratory status: spontaneous breathing and respiratory function stable Cardiovascular status: stable Postop Assessment: no apparent nausea or vomiting Anesthetic complications: no    Last Vitals:  Vitals:   05/22/17 0953 05/22/17 0959  BP:  137/72  Pulse: 66 66  Resp: 20 14  Temp:    SpO2: 100% 100%    Last Pain:  Vitals:   05/22/17 0930  TempSrc:   PainSc: 3                  Tran Arzuaga DANIEL

## 2017-05-22 NOTE — Discharge Instructions (Signed)
Transurethral Resection of Bladder Tumor (TURBT)   Definition:  Transurethral Resection of the Bladder Tumor is a surgical procedure used to diagnose and remove tumors within the bladder. TURBT is the most common treatment for early stage bladder cancer.  General instructions:     Your recent bladder surgery requires very little post hospital care but some definite precautions.  Despite the fact that no skin incisions were used, the area around the bladder incisions are raw and covered with scabs to promote healing and prevent bleeding. Certain precautions are needed to insure that the scabs are not disturbed over the next 2-4 weeks while the healing proceeds.  Because the raw surface inside your bladder and the irritating effects of urine you may expect frequency of urination and/or urgency (a stronger desire to urinate) and perhaps even getting up at night more often. This will usually resolve or improve slowly over the healing period. You may see some blood in your urine over the first 6 weeks. Do not be alarmed, even if the urine was clear for a while. Get off your feet and drink lots of fluids until clearing occurs. If you start to pass clots or don't improve call us.  Catheter: (If you are discharged with a catheter.)  1. Keep your catheter secured to your leg at all times with tape or the supplied strap. 2. You may experience leakage of urine around your catheter- as long as the  catheter continues to drain, this is normal.  If your catheter stops draining  go to the ER. 3. You may also have blood in your urine, even after it has been clear for  several days; you may even pass some small blood clots or other material.  This  is normal as well.  If this happens, sit down and drink plenty of water to help  make urine to flush out your bladder.  If the blood in your urine becomes worse  after doing this, contact our office or return to the ER. 4. You may use the leg bag (small bag)  during the day, but use the large bag at  night.  Diet:  You may return to your normal diet immediately. Because of the raw surface of your bladder, alcohol, spicy foods, foods high in acid and drinks with caffeine may cause irritation or frequency and should be used in moderation. To keep your urine flowing freely and avoid constipation, drink plenty of fluids during the day (8-10 glasses). Tip: Avoid cranberry juice because it is very acidic.  Activity:  Your physical activity doesn't need to be restricted. However, if you are very active, you may see some blood in the urine. We suggest that you reduce your activity under the circumstances until the bleeding has stopped.  Bowels:  It is important to keep your bowels regular during the postoperative period. Straining with bowel movements can cause bleeding. A bowel movement every other day is reasonable. Use a mild laxative if needed, such as milk of magnesia 2-3 tablespoons, or 2 Dulcolax tablets. Call if you continue to have problems. If you had been taking narcotics for pain, before, during or after your surgery, you may be constipated. Take a laxative if necessary.    Medication:  You should resume your pre-surgery medications unless told not to. In addition you may be given an antibiotic to prevent or treat infection. Antibiotics are not always necessary. All medication should be taken as prescribed until the bottles are finished unless you are having  an unusual reaction to one of the drugs.   Post Anesthesia Home Care Instructions  Activity: Get plenty of rest for the remainder of the day. A responsible individual must stay with you for 24 hours following the procedure.  For the next 24 hours, DO NOT: -Drive a car -Operate machinery -Drink alcoholic beverages -Take any medication unless instructed by your physician -Make any legal decisions or sign important papers.  Meals: Start with liquid foods such as gelatin or soup.  Progress to regular foods as tolerated. Avoid greasy, spicy, heavy foods. If nausea and/or vomiting occur, drink only clear liquids until the nausea and/or vomiting subsides. Call your physician if vomiting continues.  Special Instructions/Symptoms: Your throat may feel dry or sore from the anesthesia or the breathing tube placed in your throat during surgery. If this causes discomfort, gargle with warm salt water. The discomfort should disappear within 24 hours.  If you had a scopolamine patch placed behind your ear for the management of post- operative nausea and/or vomiting:  1. The medication in the patch is effective for 72 hours, after which it should be removed.  Wrap patch in a tissue and discard in the trash. Wash hands thoroughly with soap and water. 2. You may remove the patch earlier than 72 hours if you experience unpleasant side effects which may include dry mouth, dizziness or visual disturbances. 3. Avoid touching the patch. Wash your hands with soap and water after contact with the patch.        

## 2017-05-22 NOTE — Addendum Note (Signed)
Addendum  created 05/22/17 1220 by Wanita Chamberlain, CRNA   Sign clinical note

## 2017-05-22 NOTE — Op Note (Signed)
PATIENT:  Douglas Wood  PRE-OPERATIVE DIAGNOSIS: Bladder tumor  POST-OPERATIVE DIAGNOSIS: Same  PROCEDURE:  Procedure(s): 1. TRANSURETHRAL RESECTION OF BLADDER TUMOR (TURBT) (2 cm.)   SURGEON:  Surgeon(s): Claybon Jabs  ANESTHESIA:   General  EBL:  Minimal  DRAINS: None  SPECIMEN:  Bladder tumor  DISPOSITION OF SPECIMEN:  PATHOLOGY  Indication: [name] is an 84 year old male who initially experienced gross hematuria and was evaluated with a CT scan revealing no abnormality of the upper tract.  He was found to have extensive papillary tumor within the bladder and underwent resection initially on 04/14/17.  His pathology revealed high-grade muscle invasive disease and we discussed the options for treatment and he elected to proceed with chemoradiation.  He is brought back to the operating room today for resection of any further visible tumor in order to debulk as much tumor as possible in preparation for his chemoradiation.  Description of operation: The patient was taken to the operating room and administered general anesthesia. They were then placed on the table and moved to the dorsal lithotomy position after which the genitalia was sterilely prepped and draped. An official timeout was then performed.  The 12 French resectoscope with the 30 lens and visual obturator were then passed into the bladder under direct visualization. Urethra appeared normal. The visual obturator was then removed and the Gyrus resectoscope element with 30  lens was then inserted and the bladder was fully and systematically inspected. Ureteral orifices were noted to be in the normal anatomic positions.  There was 3+ trabeculation.  Several pseudo-diverticuli were noted but I noted no tumors within any of these.  There were a few small papillary tumors located on the right wall of the bladder and also some very suspicious appearing mucosa on the floor of the bladder near the midline as well as on the  posterior wall.  I first began by resecting all visible tumor from the floor, posterior wall and right wall of the bladder.  I resected some tumor but a lot of the tumor was just on the mucosa and was destroyed by the resectoscope loop.  Reinspection of the bladder revealed all obvious tumor had been fully resected and there was no evidence of perforation. The Microvasive evacuator was then used to irrigate the bladder and remove all of the portions of bladder tumor which were sent to pathology. I then removed the resectoscope.   PLAN OF CARE: Discharge to home after PACU  PATIENT DISPOSITION:  PACU - hemodynamically stable.

## 2017-05-22 NOTE — H&P (Signed)
HPI: Douglas Wood is a 84 year-old male with bladder cancer.  03/02/17: Painless gross hematuria 1 day. No precipitating event. Denies any recent fever or infection. No recent worsening or changes to voiding symptoms. He continues on Rapaflo with good therapeutic results. Denies any unexplained bone pain, I explained weight loss, unexplained fatigue. His primary care provider has been following him for recently found protein in his urine but no referral to nephrology has been made as of yet.   03/08/17: He reports he has seen no further gross hematuria. He has been taking Rapaflo and reports that this has resulted in significant improvement in his voiding.   04/26/17: He has returned today and has done exceedingly well after his surgery. He says he has not had any pain. He did have an episode of occlusion of his catheter with a clot and had to go to the emergency room about 2 days after his surgery but it has remained clear since that time. He still has his Foley catheter in and is anxious to have it removed.   05/17/17: Patient has elected to proceed with chemo radiation. He is scheduled for repeat TURBT on 1/28. He presents today with complaints of dysuria. He states that this has been ongoing since his last procedure and catheter removal. He states that it is actually improving. He feels that increased water intake his helpful. He does have increased frequency, but attributes to increased fluid intake. He denies urgency, gross hematuria, fevers, suprapubic pain, or flank pain. He denies difficulties voiding.   His bladder cancer was not superficial and limitied to the bladder lining. His bladder cancer was muscle invasive.   He did have a TURBT.   He has not had blood in his urine recently. He is not having new bone pain. He has not recently had unwanted weight loss.     ALLERGIES: Levaquin TABS    MEDICATIONS: Rapaflo 8 mg capsule take 1 capsule by mouth at bedtime  Simvastatin 20 mg tablet   Diazepam PRN  Losartan Potassium 25 mg tablet     GU PSH: Cystoscopy - 03/08/2017 Cystoscopy Insert Stent - 2009 Cystoscopy TURBT >5 cm - 04/14/2017 Locm 300-399Mg /Ml Iodine,1Ml - 03/07/2017 Ureteroscopic stone removal - 2009      PSH Notes: Cystoscopy With Ureteroscopy With Removal Of Calculus, Cystoscopy With Insertion Of Ureteral Stent Right, Dental Surgery, Hemorrhoidectomy   NON-GU PSH: Hemorrhoidectomy (favorite) - 2008    GU PMH: Bladder Cancer overlapping sites, He has elected to proceed with chemo radiation. As such I am going to schedule him for a repeat resection to try to debulk as much tumor as possible in 4 weeks. In the interim I am going to schedule him for an appointment with Dr. Alen Blew. - 04/26/2017 Bladder tumor/neoplasm, He has a large bladder tumor that in fact measures greater than 5 cm cystoscopically. It involves the right floor of his bladder and could possibly also involve his right ureteral orifice. Because of that I told him he would be at a slight risk because of having only 1 kidney and I will therefore need to monitor him for hydronephrosis if his ureteral orifice is involved and requires resection. - 03/08/2017 Gross hematuria (Stable), His gross hematuria is secondary to the large bladder tumor identified within his bladder today. - 03/08/2017, - 03/02/2017 Chronic prostatitis, Prostatitis, chronic - 2017 BPH w/LUTS, Benign prostatic hyperplasia with urinary obstruction - 2014 Encounter for Prostate Cancer screening, Prostate cancer screening - 2014 Inflammatory Disease Prostate, Unspec, Prostatitis -  07-24-12 Renal calculus, Kidney stone on right side - July 24, 2012 Testicular atrophy, Testicular atrophy - 07/24/12 Ureteral calculus, Ureteral Stone 24-Jul-2012      PMH Notes: BPH with outlet obstruction: This has been present for a number of years.  Current therapy: Rapaflo   Chronic prostatitis: He has had difficulty with this over the years. It has responded to antibiotic  therapy.   Nephrolithiasis: He underwent a CT scan in 12/09 which revealed a 2 mm stone in the right kidney. He also passed a stone at that time. His stone analysis revealed calcium oxalate.   Solitary right kidney: He has congenital absence of the left kidney.     NON-GU PMH: Encounter for general adult medical examination without abnormal findings, Encounter for preventive health examination - 07-25-2015 Renal agenesis, unspecified, Congenital absence of kidney - 2012-07-24 Sebaceous cyst, Sebaceous cyst - 2012-07-24    FAMILY HISTORY: Death In The Family Father - Runs In Family Death In The Family Mother - Runs In Family   SOCIAL HISTORY: Marital Status: Married Preferred Language: English; Ethnicity: Not Hispanic Or Latino; Race: White Current Smoking Status: Patient does not smoke anymore. Has not smoked since 02/24/1967.   Tobacco Use Assessment Completed: Used Tobacco in last 30 days? Does drink.  Drinks 1 caffeinated drink per day. Patient's occupation is/was retired.     Notes: Former smoker, Occupation:, Alcohol Use, Caffeine Use, Marital History - Currently Married   REVIEW OF SYSTEMS:    GU Review Male:   Patient reports burning/ pain with urination. Patient denies frequent urination, hard to postpone urination, get up at night to urinate, leakage of urine, stream starts and stops, trouble starting your stream, have to strain to urinate , erection problems, and penile pain.  Gastrointestinal (Upper):   Patient denies nausea, vomiting, and indigestion/ heartburn.  Gastrointestinal (Lower):   Patient denies diarrhea and constipation.  Constitutional:   Patient denies fever, night sweats, weight loss, and fatigue.  Skin:   Patient denies skin rash/ lesion and itching.  Eyes:   Patient denies blurred vision and double vision.  Ears/ Nose/ Throat:   Patient denies sore throat and sinus problems.  Hematologic/Lymphatic:   Patient denies swollen glands and easy bruising.  Cardiovascular:    Patient denies leg swelling and chest pains.  Respiratory:   Patient denies cough and shortness of breath.  Endocrine:   Patient denies excessive thirst.  Musculoskeletal:   Patient denies back pain and joint pain.  Neurological:   Patient denies headaches and dizziness.  Psychologic:   Patient denies depression and anxiety.   VITAL SIGNS:    Weight 145 lb / 65.77 kg  Height 70 in / 177.8 cm  BP 134/80 mmHg  Pulse 71 /min  Temperature 97.8 F / 36.5 C  BMI 20.8 kg/m   MULTI-SYSTEM PHYSICAL EXAMINATION:    Constitutional: Well-nourished. No physical deformities. Normally developed. Good grooming.  Respiratory: No labored breathing, no use of accessory muscles.   Cardiovascular: Normal temperature, no swelling, no varicosities.   Skin: No paleness, no jaundice, no cyanosis. No lesion, no ulcer, no rash.  Neurologic / Psychiatric: Oriented to time, oriented to place, oriented to person. No depression, no anxiety, no agitation.  Gastrointestinal: No mass, no tenderness, no rigidity, non obese abdomen.  Musculoskeletal: Spine, ribs, pelvis no bilateral tenderness. Normal gait and station of head and neck.     PAST DATA REVIEWED:  Source Of History:  Patient  Records Review:   Previous Patient Records  Urine  Test Review:   Urinalysis   06/15/11 05/31/10 03/17/08 04/17/07 03/14/07 02/14/06 02/16/05 02/20/04  PSA  Total PSA 1.28  1.48  2.55  1.97  3.78  1.16  1.19  0.84     PROCEDURES:          Urinalysis w/Scope Dipstick Dipstick Cont'd Micro  Color: Yellow Bilirubin: Neg WBC/hpf: 20 - 40/hpf  Appearance: Cloudy Ketones: Neg RBC/hpf: 3 - 10/hpf  Specific Gravity: 1.015 Blood: 1+ Bacteria: Rare (0-9/hpf)  pH: 6.5 Protein: Neg Cystals: NS (Not Seen)  Glucose: Neg Urobilinogen: 0.2 Casts: NS (Not Seen)    Nitrites: Neg Trichomonas: Not Present    Leukocyte Esterase: Trace Mucous: Not Present      Epithelial Cells: 0 - 5/hpf      Yeast: NS (Not Seen)      Sperm: Not Present    ASSESSMENT:      ICD-10 Details  1 GU:   Bladder Cancer overlapping sites - C67.8 He has elected to proceed with chemo radiation. As such I am going to schedule him for a repeat resection to try to debulk as much tumor as possible in 4 weeks. In the interim I am going to schedule him for an appointment with Dr. Alen Blew.    I went over his pathology report with him today and gave him a copy. We discussed the fact that he has muscle invasive disease and we therefore discussed the options for management. I 1st discussed the gold standard which would be a radical cystectomy. He is familiar with this as he has a friend who has undergone this procedure. He is 72 years old but has no evidence of extravesical disease or adenopathy on his CT scan so this is an option. We also discussed radical TURBT but I did recommend that due to the fact that there are other options that I think have a better probability of long-term disease control. Finally we did discuss chemo radiation and I told him that if he elected to proceed with this form of therapy I would recommend a repeat resection in order to debulk as much tumor as possible prior to undergoing treatment.   Urine Cult. 05/17/17 - neg.

## 2017-05-23 ENCOUNTER — Encounter (HOSPITAL_BASED_OUTPATIENT_CLINIC_OR_DEPARTMENT_OTHER): Payer: Self-pay | Admitting: Urology

## 2017-06-08 ENCOUNTER — Inpatient Hospital Stay: Payer: Medicare Other

## 2017-06-08 ENCOUNTER — Inpatient Hospital Stay: Payer: Medicare Other | Attending: Oncology

## 2017-06-08 VITALS — BP 133/66 | HR 63 | Temp 98.0°F | Resp 17

## 2017-06-08 DIAGNOSIS — R110 Nausea: Secondary | ICD-10-CM | POA: Diagnosis not present

## 2017-06-08 DIAGNOSIS — C679 Malignant neoplasm of bladder, unspecified: Secondary | ICD-10-CM | POA: Insufficient documentation

## 2017-06-08 DIAGNOSIS — Z5111 Encounter for antineoplastic chemotherapy: Secondary | ICD-10-CM | POA: Diagnosis not present

## 2017-06-08 DIAGNOSIS — C7989 Secondary malignant neoplasm of other specified sites: Secondary | ICD-10-CM | POA: Insufficient documentation

## 2017-06-08 LAB — CBC WITH DIFFERENTIAL (CANCER CENTER ONLY)
BASOS ABS: 0 10*3/uL (ref 0.0–0.1)
Basophils Relative: 0 %
EOS PCT: 3 %
Eosinophils Absolute: 0.2 10*3/uL (ref 0.0–0.5)
HCT: 40.5 % (ref 38.4–49.9)
Hemoglobin: 13.4 g/dL (ref 13.0–17.1)
LYMPHS PCT: 23 %
Lymphs Abs: 1.2 10*3/uL (ref 0.9–3.3)
MCH: 31.2 pg (ref 27.2–33.4)
MCHC: 33.1 g/dL (ref 32.0–36.0)
MCV: 94.2 fL (ref 79.3–98.0)
Monocytes Absolute: 0.7 10*3/uL (ref 0.1–0.9)
Monocytes Relative: 13 %
NEUTROS ABS: 3.3 10*3/uL (ref 1.5–6.5)
Neutrophils Relative %: 61 %
PLATELETS: 229 10*3/uL (ref 140–400)
RBC: 4.3 MIL/uL (ref 4.20–5.82)
RDW: 13.3 % (ref 11.0–14.6)
WBC Count: 5.4 10*3/uL (ref 4.0–10.3)

## 2017-06-08 LAB — CMP (CANCER CENTER ONLY)
ALT: 13 U/L (ref 0–55)
ANION GAP: 7 (ref 3–11)
AST: 18 U/L (ref 5–34)
Albumin: 3.9 g/dL (ref 3.5–5.0)
Alkaline Phosphatase: 75 U/L (ref 40–150)
BILIRUBIN TOTAL: 0.8 mg/dL (ref 0.2–1.2)
BUN: 26 mg/dL (ref 7–26)
CO2: 27 mmol/L (ref 22–29)
Calcium: 9.4 mg/dL (ref 8.4–10.4)
Chloride: 108 mmol/L (ref 98–109)
Creatinine: 1 mg/dL (ref 0.70–1.30)
GFR, Est AFR Am: >60 mL/min (ref >60)
Glucose, Bld: 87 mg/dL (ref 70–140)
POTASSIUM: 5.1 mmol/L (ref 3.5–5.1)
Sodium: 142 mmol/L (ref 136–145)
TOTAL PROTEIN: 6.8 g/dL (ref 6.4–8.3)

## 2017-06-08 MED ORDER — PALONOSETRON HCL INJECTION 0.25 MG/5ML
INTRAVENOUS | Status: AC
Start: 2017-06-08 — End: 2017-06-08
  Filled 2017-06-08: qty 5

## 2017-06-08 MED ORDER — CARBOPLATIN CHEMO INJECTION 450 MG/45ML
160.0000 mg | Freq: Once | INTRAVENOUS | Status: AC
  Administered 2017-06-08: 160 mg via INTRAVENOUS
  Filled 2017-06-08: qty 16

## 2017-06-08 MED ORDER — DEXAMETHASONE SODIUM PHOSPHATE 10 MG/ML IJ SOLN
INTRAMUSCULAR | Status: AC
  Filled 2017-06-08: qty 1

## 2017-06-08 MED ORDER — DEXAMETHASONE SODIUM PHOSPHATE 10 MG/ML IJ SOLN
10.0000 mg | Freq: Once | INTRAMUSCULAR | Status: AC
  Administered 2017-06-08: 10 mg via INTRAVENOUS

## 2017-06-08 MED ORDER — SODIUM CHLORIDE 0.9 % IV SOLN
Freq: Once | INTRAVENOUS | Status: AC
  Administered 2017-06-08: 10:00:00 via INTRAVENOUS

## 2017-06-08 MED ORDER — PALONOSETRON HCL INJECTION 0.25 MG/5ML
0.2500 mg | Freq: Once | INTRAVENOUS | Status: AC
  Administered 2017-06-08: 0.25 mg via INTRAVENOUS

## 2017-06-08 NOTE — Patient Instructions (Signed)
[facility] Discharge Instructions for Patients Receiving Chemotherapy  Today you received the following chemotherapy agents Carpoplatin  To help prevent nausea and vomiting after your treatment, we encourage you to take your nausea medication as directed by your MD.     If you develop nausea and vomiting that is not controlled by your nausea medication, call the clinic.   BELOW ARE SYMPTOMS THAT SHOULD BE REPORTED IMMEDIATELY:  *FEVER GREATER THAN 100.5 F  *CHILLS WITH OR WITHOUT FEVER  NAUSEA AND VOMITING THAT IS NOT CONTROLLED WITH YOUR NAUSEA MEDICATION  *UNUSUAL SHORTNESS OF BREATH  *UNUSUAL BRUISING OR BLEEDING  TENDERNESS IN MOUTH AND THROAT WITH OR WITHOUT PRESENCE OF ULCERS  *URINARY PROBLEMS  *BOWEL PROBLEMS  UNUSUAL RASH Items with * indicate a potential emergency and should be followed up as soon as possible.  Feel free to call the clinic should you have any questions or concerns. The clinic phone number is [phone].  Please show the [facility] at check-in to the Emergency Department and triage nurse.  Carboplatin injection What is this medicine? CARBOPLATIN (KAR boe pla tin) is a chemotherapy drug. It targets fast dividing cells, like cancer cells, and causes these cells to die. This medicine is used to treat ovarian cancer and many other cancers. This medicine may be used for other purposes; ask your health care provider or pharmacist if you have questions. COMMON BRAND NAME(S): Paraplatin What should I tell my health care provider before I take this medicine? They need to know if you have any of these conditions: -blood disorders -hearing problems -kidney disease -recent or ongoing radiation therapy -an unusual or allergic reaction to carboplatin, cisplatin, other chemotherapy, other medicines, foods, dyes, or preservatives -pregnant or trying to get pregnant -breast-feeding How should I use this medicine? This drug is usually  given as an infusion into a vein. It is administered in a hospital or clinic by a specially trained health care professional. Talk to your pediatrician regarding the use of this medicine in children. Special care may be needed. Overdosage: If you think you have taken too much of this medicine contact a poison control center or emergency room at once. NOTE: This medicine is only for you. Do not share this medicine with others. What if I miss a dose? It is important not to miss a dose. Call your doctor or health care professional if you are unable to keep an appointment. What may interact with this medicine? -medicines for seizures -medicines to increase blood counts like filgrastim, pegfilgrastim, sargramostim -some antibiotics like amikacin, gentamicin, neomycin, streptomycin, tobramycin -vaccines Talk to your doctor or health care professional before taking any of these medicines: -acetaminophen -aspirin -ibuprofen -ketoprofen -naproxen This list may not describe all possible interactions. Give your health care provider a list of all the medicines, herbs, non-prescription drugs, or dietary supplements you use. Also tell them if you smoke, drink alcohol, or use illegal drugs. Some items may interact with your medicine. What should I watch for while using this medicine? Your condition will be monitored carefully while you are receiving this medicine. You will need important blood work done while you are taking this medicine. This drug may make you feel generally unwell. This is not uncommon, as chemotherapy can affect healthy cells as well as cancer cells. Report any side effects. Continue your course of treatment even though you feel ill unless your doctor tells you to stop. In some cases, you may be given additional medicines to help  with side effects. Follow all directions for their use. Call your doctor or health care professional for advice if you get a fever, chills or sore throat, or  other symptoms of a cold or flu. Do not treat yourself. This drug decreases your body's ability to fight infections. Try to avoid being around people who are sick. This medicine may increase your risk to bruise or bleed. Call your doctor or health care professional if you notice any unusual bleeding. Be careful brushing and flossing your teeth or using a toothpick because you may get an infection or bleed more easily. If you have any dental work done, tell your dentist you are receiving this medicine. Avoid taking products that contain aspirin, acetaminophen, ibuprofen, naproxen, or ketoprofen unless instructed by your doctor. These medicines may hide a fever. Do not become pregnant while taking this medicine. Women should inform their doctor if they wish to become pregnant or think they might be pregnant. There is a potential for serious side effects to an unborn child. Talk to your health care professional or pharmacist for more information. Do not breast-feed an infant while taking this medicine. What side effects may I notice from receiving this medicine? Side effects that you should report to your doctor or health care professional as soon as possible: -allergic reactions like skin rash, itching or hives, swelling of the face, lips, or tongue -signs of infection - fever or chills, cough, sore throat, pain or difficulty passing urine -signs of decreased platelets or bleeding - bruising, pinpoint red spots on the skin, black, tarry stools, nosebleeds -signs of decreased red blood cells - unusually weak or tired, fainting spells, lightheadedness -breathing problems -changes in hearing -changes in vision -chest pain -high blood pressure -low blood counts - This drug may decrease the number of white blood cells, red blood cells and platelets. You may be at increased risk for infections and bleeding. -nausea and vomiting -pain, swelling, redness or irritation at the injection site -pain, tingling,  numbness in the hands or feet -problems with balance, talking, walking -trouble passing urine or change in the amount of urine Side effects that usually do not require medical attention (report to your doctor or health care professional if they continue or are bothersome): -hair loss -loss of appetite -metallic taste in the mouth or changes in taste This list may not describe all possible side effects. Call your doctor for medical advice about side effects. You may report side effects to FDA at 1-800-FDA-1088. Where should I keep my medicine? This drug is given in a hospital or clinic and will not be stored at home. NOTE: This sheet is a summary. It may not cover all possible information. If you have questions about this medicine, talk to your doctor, pharmacist, or health care provider.  2018 Elsevier/Gold Standard (2007-07-17 14:38:05)

## 2017-06-15 ENCOUNTER — Inpatient Hospital Stay (HOSPITAL_BASED_OUTPATIENT_CLINIC_OR_DEPARTMENT_OTHER): Payer: Medicare Other | Admitting: Oncology

## 2017-06-15 ENCOUNTER — Inpatient Hospital Stay: Payer: Medicare Other

## 2017-06-15 ENCOUNTER — Encounter: Payer: Self-pay | Admitting: Radiation Oncology

## 2017-06-15 VITALS — BP 132/72 | HR 84 | Temp 97.7°F | Resp 17 | Ht 70.0 in | Wt 155.4 lb

## 2017-06-15 DIAGNOSIS — R110 Nausea: Secondary | ICD-10-CM

## 2017-06-15 DIAGNOSIS — C7989 Secondary malignant neoplasm of other specified sites: Secondary | ICD-10-CM

## 2017-06-15 DIAGNOSIS — C679 Malignant neoplasm of bladder, unspecified: Secondary | ICD-10-CM | POA: Diagnosis not present

## 2017-06-15 DIAGNOSIS — C678 Malignant neoplasm of overlapping sites of bladder: Secondary | ICD-10-CM

## 2017-06-15 DIAGNOSIS — Z5111 Encounter for antineoplastic chemotherapy: Secondary | ICD-10-CM | POA: Diagnosis not present

## 2017-06-15 LAB — CBC WITH DIFFERENTIAL (CANCER CENTER ONLY)
BASOS ABS: 0 10*3/uL (ref 0.0–0.1)
Basophils Relative: 1 %
EOS PCT: 4 %
Eosinophils Absolute: 0.2 10*3/uL (ref 0.0–0.5)
HEMATOCRIT: 42.6 % (ref 38.4–49.9)
Hemoglobin: 14.1 g/dL (ref 13.0–17.1)
Lymphocytes Relative: 32 %
Lymphs Abs: 1.5 10*3/uL (ref 0.9–3.3)
MCH: 30.7 pg (ref 27.2–33.4)
MCHC: 33.1 g/dL (ref 32.0–36.0)
MCV: 92.7 fL (ref 79.3–98.0)
MONO ABS: 0.7 10*3/uL (ref 0.1–0.9)
Monocytes Relative: 14 %
NEUTROS ABS: 2.3 10*3/uL (ref 1.5–6.5)
Neutrophils Relative %: 49 %
Platelet Count: 230 10*3/uL (ref 140–400)
RBC: 4.6 MIL/uL (ref 4.20–5.82)
RDW: 13.4 % (ref 11.0–14.6)
WBC Count: 4.6 10*3/uL (ref 4.0–10.3)

## 2017-06-15 LAB — CMP (CANCER CENTER ONLY)
ALBUMIN: 3.9 g/dL (ref 3.5–5.0)
ALT: 19 U/L (ref 0–55)
ANION GAP: 9 (ref 3–11)
AST: 20 U/L (ref 5–34)
Alkaline Phosphatase: 72 U/L (ref 40–150)
BUN: 21 mg/dL (ref 7–26)
CHLORIDE: 105 mmol/L (ref 98–109)
CO2: 25 mmol/L (ref 22–29)
Calcium: 9.3 mg/dL (ref 8.4–10.4)
Creatinine: 1.01 mg/dL (ref 0.70–1.30)
GFR, Est AFR Am: >60 mL/min (ref >60)
GFR, Estimated: >60 mL/min (ref >60)
GLUCOSE: 102 mg/dL (ref 70–140)
POTASSIUM: 4.4 mmol/L (ref 3.5–5.1)
Sodium: 139 mmol/L (ref 136–145)
Total Bilirubin: 0.4 mg/dL (ref 0.2–1.2)
Total Protein: 7.1 g/dL (ref 6.4–8.3)

## 2017-06-15 NOTE — Progress Notes (Signed)
Hematology and Oncology Follow Up Visit  Douglas Wood [id number] Apr 24, 1932 84 y.o. 06/15/2017 9:48 AM Douglas Wood, MDShaw, [name], MD   Principle Diagnosis: 84 year old gentleman with high-grade invasive urothelial carcinoma arising from the bladder diagnosed in December 2018.  He was found to have T2N0 disease.     Prior Therapy: He underwent TURBT on April 14, 2017 under the care of Dr. Karsten Ro and found to have a tumor measuring at least 5.5 cm involving the right wall of the bladder as well as the right posterior floor and the posterior wall of the bladder.  The final pathology showed high-grade carcinoma with muscle invasion  Current therapy: Under evaluation to start radiation with chemotherapy.  He will receive carboplatin AUC of 2 on a weekly basis.  Interim History: Mr. Douglas Wood presents today for a follow-up visit.  Since the last visit, he underwent a repeat TURBT on May 22, 2017 with a small residual tumor of 2 cm was noted.  He had a high-grade urothelial carcinoma but no muscle invasion noted.  His original specimen in December 2018 did show muscle invasion however.  He did receive the first week of carboplatin without radiation and reported no complications at this time.  He does not report any hematuria or dysuria.  Does not report any flank pain or discomfort.  He remains active and continues to attend activities of daily living.  He does not report any headaches, blurry vision, syncope or seizures. Does not report any fevers, chills or sweats.  Does not report any cough, wheezing or hemoptysis.  Does not report any chest pain, palpitation, orthopnea or leg edema.  Does not report any nausea, vomiting or abdominal pain.  She does not report any constipation or diarrhea.  Does not report any skeletal complaints.    Does not report frequency, urgency or hematuria.  Does not report any skin rashes or lesions. Does not report any heat or cold intolerance.  Does not report  any lymphadenopathy or petechiae.  Does not report any anxiety or depression.  Remaining review of systems is negative.    Medications: I have reviewed the patient's current medications.  Current Outpatient Medications  Medication Sig Dispense Refill  . diazepam (VALIUM) 5 MG tablet Take 2.5-5 mg by mouth at bedtime as needed for anxiety. Takes 2.5 mg in the morning and 5 mg at bedtime as needed.    Marland Kitchen losartan-hydrochlorothiazide (HYZAAR) 50-12.5 MG per tablet Take 1 tablet by mouth every evening.     Marland Kitchen omeprazole (PRILOSEC) 20 MG capsule Take 20 mg by mouth 2 (two) times daily.     . phenazopyridine (PYRIDIUM) 200 MG tablet Take 1 tablet (200 mg total) by mouth 3 (three) times daily as needed for pain. 14 tablet 0  . prochlorperazine (COMPAZINE) 10 MG tablet Take 1 tablet (10 mg total) by mouth every 6 (six) hours as needed for nausea or vomiting. 30 tablet 0  . silodosin (RAPAFLO) 8 MG CAPS capsule Take 8 mg by mouth every evening.    . simvastatin (ZOCOR) 10 MG tablet Take 10 mg by mouth every evening.  0  . traMADol (ULTRAM) 50 MG tablet Take 1 tablet (50 mg total) by mouth every 6 (six) hours as needed. 12 tablet 0   No current facility-administered medications for this visit.      Allergies:  Allergies  Allergen Reactions  . Levaquin [Levofloxacin In D5w]     Joint pain  . Oxycodone-Acetaminophen     Causes vertigo  Past Medical History, Surgical history, Social history, and Family History were reviewed and updated.  Review of Systems:  Remaining ROS negative.  Physical Exam: Blood pressure 132/72, pulse 84, temperature 97.7 F (36.5 C), temperature source Oral, resp. rate 17, height 5\' 10"  (1.778 m), weight 155 lb 6.4 oz (70.5 kg), SpO2 98 %. ECOG: 0 General appearance: alert and cooperative appeared without distress. Head: Normocephalic, without obvious abnormality Oropharynx: No oral thrush or ulcers. Eyes: No scleral icterus.  Pupils are equal and round reactive  to light. Lymph nodes: Cervical, supraclavicular, and axillary nodes normal. Heart:regular rate and rhythm, S1, S2 normal, no murmur, click, rub or gallop Lung:chest clear, no wheezing, rales, normal symmetric air entry Abdomin: soft, non-tender, without masses or organomegaly. Neurological: No motor, sensory deficits.  Intact deep tendon reflexes. Skin: No rashes or lesions.  No ecchymosis or petechiae. Musculoskeletal: No joint deformity or effusion.    Lab Results: Lab Results  Component Value Date   WBC 4.6 06/15/2017   HGB 14.3 05/22/2017   HCT 42.6 06/15/2017   MCV 92.7 06/15/2017   PLT 230 06/15/2017     Chemistry      Component Value Date/Time   NA 142 06/08/2017 0919   K 5.1 06/08/2017 0919   CL 108 06/08/2017 0919   CO2 27 06/08/2017 0919   BUN 26 06/08/2017 0919   CREATININE 1.00 06/08/2017 0919      Component Value Date/Time   CALCIUM 9.4 06/08/2017 0919   ALKPHOS 75 06/08/2017 0919   AST 18 06/08/2017 0919   ALT 13 06/08/2017 0919   BILITOT 0.8 06/08/2017 0919        Impression and Plan:  84 year old man with:   1.  High-grade invasive urothelial carcinoma arising from the bladder diagnosed in December 2018.    He has muscle invasive disease with clinical staging of T2N0.  He had a repeat TURBT on January 2019 for maximal debulking.  Treatment options were reviewed again and he would be an ideal candidate for radical cystectomy but he continued to defer that option.  I will refer him urgently to radiation oncology to start the process of treating him definitively with radiation and weekly carboplatin.  He received the first week of carboplatin without any complications.  His second week of treatment will be deferred until the start of radiation therapy.  2.  IV access: Peripheral veins will be used at this time.    No complications noted after the first treatment.  3.  Nausea prophylaxis: Prescription for Compazine was made available to the patient  today.  4.  Kidney function surveillance: His creatinine remains close to normal at this time with normal renal function.  15 minutes was spent with the patient face-to-face today.  More than 50% of time was dedicated to patient counseling, education and coordination of his multifaceted care.     Zola Button, MD 2/21/20199:48 AM

## 2017-06-16 ENCOUNTER — Telehealth: Payer: Self-pay

## 2017-06-16 NOTE — Telephone Encounter (Signed)
Tonya scheduled 4/4 and 4/11. Levada Dy completed 3/28 to complete the 2/21 los

## 2017-06-19 ENCOUNTER — Ambulatory Visit
Admission: RE | Admit: 2017-06-19 | Discharge: 2017-06-19 | Disposition: A | Discharge status: Home / Self Care | Payer: Medicare Other | Source: Ambulatory Visit | Attending: Radiation Oncology | Admitting: Radiation Oncology

## 2017-06-19 ENCOUNTER — Other Ambulatory Visit: Payer: Self-pay

## 2017-06-19 ENCOUNTER — Encounter: Payer: Self-pay | Admitting: Radiation Oncology

## 2017-06-19 VITALS — BP 116/74 | HR 74 | Resp 18 | Ht 70.0 in | Wt 158.2 lb

## 2017-06-19 VITALS — BP 116/74 | HR 74 | Temp 98.0°F | Resp 18 | Ht 70.0 in | Wt 158.2 lb

## 2017-06-19 DIAGNOSIS — K589 Irritable bowel syndrome without diarrhea: Secondary | ICD-10-CM | POA: Diagnosis not present

## 2017-06-19 DIAGNOSIS — M129 Arthropathy, unspecified: Secondary | ICD-10-CM | POA: Diagnosis not present

## 2017-06-19 DIAGNOSIS — I10 Essential (primary) hypertension: Secondary | ICD-10-CM | POA: Insufficient documentation

## 2017-06-19 DIAGNOSIS — Z85828 Personal history of other malignant neoplasm of skin: Secondary | ICD-10-CM | POA: Insufficient documentation

## 2017-06-19 DIAGNOSIS — E785 Hyperlipidemia, unspecified: Secondary | ICD-10-CM | POA: Insufficient documentation

## 2017-06-19 DIAGNOSIS — Z8052 Family history of malignant neoplasm of bladder: Secondary | ICD-10-CM | POA: Diagnosis not present

## 2017-06-19 DIAGNOSIS — C676 Malignant neoplasm of ureteric orifice: Secondary | ICD-10-CM | POA: Diagnosis present

## 2017-06-19 DIAGNOSIS — Z79899 Other long term (current) drug therapy: Secondary | ICD-10-CM | POA: Insufficient documentation

## 2017-06-19 DIAGNOSIS — Z905 Acquired absence of kidney: Secondary | ICD-10-CM | POA: Diagnosis not present

## 2017-06-19 DIAGNOSIS — Z87891 Personal history of nicotine dependence: Secondary | ICD-10-CM | POA: Diagnosis not present

## 2017-06-19 DIAGNOSIS — K219 Gastro-esophageal reflux disease without esophagitis: Secondary | ICD-10-CM | POA: Diagnosis not present

## 2017-06-19 DIAGNOSIS — Z9221 Personal history of antineoplastic chemotherapy: Secondary | ICD-10-CM | POA: Diagnosis not present

## 2017-06-19 DIAGNOSIS — Z8042 Family history of malignant neoplasm of prostate: Secondary | ICD-10-CM | POA: Diagnosis not present

## 2017-06-19 DIAGNOSIS — Z87442 Personal history of urinary calculi: Secondary | ICD-10-CM | POA: Diagnosis not present

## 2017-06-19 HISTORY — DX: Unspecified malignant neoplasm of skin, unspecified: C44.90

## 2017-06-19 NOTE — Progress Notes (Signed)
Radiation Oncology         [phone] ________________________________  Initial Outpatient Consultation  Name: Douglas Wood MRN: [id number]  Date of Service: 06/19/2017 DOB: 02-Jul-1931  ZE:SPQZ, [name], MD  Wyatt Portela, MD   REFERRING PHYSICIAN: Wyatt Portela, MD  DIAGNOSIS: 84 y.o. gentleman with high grade T2N0 urothelial carcinoma of the bladder    ICD-10-CM   1. Malignant neoplasm of ureteric orifice ([address]) C67.6     HISTORY OF PRESENT ILLNESS: Douglas Wood is a 84 y.o. male seen at the request of Dr. Alen Blew for a recent diagnosis of bladder cancer. He initially presented with a 40-month history of dysuria and hematuria in November 2018. He was evaluated by Dr. Karsten Ro and had a CT scan done on 03/07/2017 that showed a 4.8 cm right bladder mass near the right ureteral orifice. There was no evidence of metastatic disease in the abdomen/pelvis. He underwent TURBT on 04/14/2017 with pathology revealing high-grade invasive urothelial carcinoma with muscularis propria involvement, and he subsequently underwent a second resection on 05/22/2017. The patient consulted with Dr. Alen Blew and received his first cycle of carboplatin on 06/08/2017. He is not scheduled for his second cycle at this time. He presents today to discuss options for concurrent chemoRT.  PREVIOUS RADIATION THERAPY: No  PAST MEDICAL HISTORY:  Past Medical History:  Diagnosis Date  . Arthritis   . Bladder cancer [facility]) urologist-  dr Karsten Ro   dx 04-14-2017 per high grade invasive urothelial carcinoma  . Congenital absence of left kidney   . Dysuria   . GERD (gastroesophageal reflux disease)   . History of basal cell carcinoma excision    hx multiple BCC of skin removals  . History of chronic prostatitis UROLOGIST-  DR Consuella Lose  . History of kidney stones   . History of squamous cell carcinoma excision    multiple SCC of skin removals  . Hyperlipidemia   . Hypertension   . IBS (irritable bowel syndrome)     . IBS (irritable bowel syndrome)   . Skin cancer   . Solitary kidney    LEFT      PAST SURGICAL HISTORY: Past Surgical History:  Procedure Laterality Date  . EXCISION BENIGN LYMPH NODE LEFT CERCIAL REGION  09-11-2013    dr toth  [facility]  . HEMORROIDECTOMY  05/30/2005  . RIGHT URETEROSCOPY W/ STONE EXTRACTION AND STENT PLACEMENT  04-04-2008    dr Everlene Balls  [facility]  . TRANSURETHRAL RESECTION OF BLADDER TUMOR N/A 04/14/2017   Procedure: TRANSURETHRAL RESECTION OF BLADDER TUMOR  (TURBT);  Surgeon: Kathie Rhodes, MD;  Location: [facility];  Service: Urology;  Laterality: N/A;  . TRANSURETHRAL RESECTION OF BLADDER TUMOR N/A 05/22/2017   Procedure: TRANSURETHRAL RESECTION OF BLADDER TUMOR (TURBT);  Surgeon: Kathie Rhodes, MD;  Location: [facility];  Service: Urology;  Laterality: N/A;    FAMILY HISTORY:  Family History  Problem Relation Age of Onset  . Cancer Father        prostate/radiation  . Cancer Brother        bladder/dx 30 years ago    SOCIAL HISTORY:  Social History   Socioeconomic History  . Marital status: Married    Spouse name: Not on file  . Number of children: Not on file  . Years of education: Not on file  . Highest education level: Not on file  Social Needs  . Financial resource strain: Not on file  . Food insecurity - worry: Not on  file  . Food insecurity - inability: Not on file  . Transportation needs - medical: Not on file  . Transportation needs - non-medical: Not on file  Occupational History  . Occupation: retired  Tobacco Use  . Smoking status: Former Smoker    Packs/day: 1.00    Years: 10.00    Pack years: 10.00    Types: Cigarettes    Last attempt to quit: 04/11/1964    Years since quitting: 53.2  . Smokeless tobacco: Never Used  Substance and Sexual Activity  . Alcohol use: Yes    Alcohol/week: 8.4 oz    Types: 14 Glasses of wine per week    Comment: nightly 1-2 glasses of wine  . Drug use: No  . Sexual  activity: No  Other Topics Concern  . Not on file  Social History Narrative   Resides in [place]. Married. 2 sons and 1 daugther. 3 grandchildren. 2 great-grandchildren. Likes to play golf. Used to be an avid runner buts walks one hour per day now. Has a dog named [place] ([place] shepherd).    ALLERGIES: Ezetimibe; Levaquin [levofloxacin in d5w]; and Oxycodone-acetaminophen  MEDICATIONS:  Current Outpatient Medications  Medication Sig Dispense Refill  . diazepam (VALIUM) 5 MG tablet Take 2.5-5 mg by mouth at bedtime as needed for anxiety. Takes 2.5 mg in the morning and 5 mg at bedtime as needed.    Marland Kitchen omeprazole (PRILOSEC) 20 MG capsule Take 20 mg by mouth 2 (two) times daily.     . silodosin (RAPAFLO) 8 MG CAPS capsule Take 8 mg by mouth every evening.    . simvastatin (ZOCOR) 10 MG tablet Take 10 mg by mouth every evening.  0  . losartan (COZAAR) 50 MG tablet   0  . prochlorperazine (COMPAZINE) 10 MG tablet Take 10 mg by mouth every 6 (six) hours as needed for nausea or vomiting.     No current facility-administered medications for this encounter.     REVIEW OF SYSTEMS:  On review of systems, the patient reports that he is doing well overall. He denies any chest pain, shortness of breath, cough, fevers, chills, night sweats, or unintended weight changes. He reports hematuria and dysuria resolved after surgery. He denies any other urinary symptoms. He denies any bowel disturbances, and denies abdominal pain, nausea or vomiting. He denies any new musculoskeletal or joint aches or pains. A complete review of systems is obtained and is otherwise negative.    PHYSICAL EXAM:  Wt Readings from Last 3 Encounters:  06/19/17 158 lb 3.2 oz (71.8 kg)  06/19/17 158 lb 3.2 oz (71.8 kg)  06/15/17 155 lb 6.4 oz (70.5 kg)   Temp Readings from Last 3 Encounters:  06/19/17 98 F (36.7 C) (Oral)  06/15/17 97.7 F (36.5 C) (Oral)  06/08/17 98 F (36.7 C) (Oral)   BP Readings from Last 3  Encounters:  06/19/17 116/74  06/19/17 116/74  06/15/17 132/72   Pulse Readings from Last 3 Encounters:  06/19/17 74  06/19/17 74  06/15/17 84   Pain Assessment Pain Score: 0-No pain/10  In general this is a well appearing caucasian male in no acute distress. He's alert and oriented x4 and appropriate throughout the examination. Cardiopulmonary assessment is negative for acute distress and he exhibits normal effort.    KPS = 100  100 - Normal; no complaints; no evidence of disease. 90   - Able to carry on normal activity; minor signs or symptoms of disease. 80   - Normal  activity with effort; some signs or symptoms of disease. 10   - Cares for self; unable to carry on normal activity or to do active work. 60   - Requires occasional assistance, but is able to care for most of his personal needs. 50   - Requires considerable assistance and frequent medical care. 75   - Disabled; requires special care and assistance. 86   - Severely disabled; hospital admission is indicated although death not imminent. 49   - Very sick; hospital admission necessary; active supportive treatment necessary. 10   - Moribund; fatal processes progressing rapidly. 0     - Dead  Karnofsky DA, Abelmann [place], Craver LS and Burchenal [facility] [phone]) The use of the nitrogen mustards in the palliative treatment of carcinoma: with particular reference to bronchogenic carcinoma Cancer 1 [id number]  LABORATORY DATA:  Lab Results  Component Value Date   WBC 4.6 06/15/2017   HGB 14.3 05/22/2017   HCT 42.6 06/15/2017   MCV 92.7 06/15/2017   PLT 230 06/15/2017   Lab Results  Component Value Date   NA 139 06/15/2017   K 4.4 06/15/2017   CL 105 06/15/2017   CO2 25 06/15/2017   Lab Results  Component Value Date   ALT 19 06/15/2017   AST 20 06/15/2017   ALKPHOS 72 06/15/2017   BILITOT 0.4 06/15/2017     RADIOGRAPHY: No results found.    IMPRESSION/PLAN: 1. 84 y.o. gentleman with high grade T2N0 urothelial  carcinoma of the bladder. Today, I spoke with the patient and family about the findings and work-up thus far.  We discussed the natural history of bladder cancer and general treatment, highlighting the role of radiotherapy in the management. At this time, the patient is not interested in cystectomy, and we reviewed that in cases like his we could consider radiotherapy with concurrent chemosensitization  We discussed the available radiation techniques, and focused on the details of logistics and delivery.  We reviewed the anticipated acute and late sequelae associated with radiation in this setting, and I would anticipate about 6 weeks of treatment.  The patient would like to proceed with radiation and is scheduled for CT simulation tomorrow at 9:00 AM with radiation treatment to begin 06/26/17.   _____________________________________  Sheral Apley. Tammi Klippel, M.D.  This document serves as a record of services personally performed by Tyler Pita, MD and Shona Simpson, PA-C. It was created on their behalf by Rae Lips, a trained medical scribe. The creation of this record is based on the scribe's personal observations and the providers' statements to them. This document has been checked and approved by the attending providers.

## 2017-06-19 NOTE — Progress Notes (Signed)
GU Location of Tumor / Histology: high grade invasive urothelial carcinoma arising from bladder diagnosed December 2018.    CORDAY WYKA reports after playing a round of golf in September 2018 he experience painful hematuria. He made an appointment with his urologist, Dr. Karsten Ro for evaluation. He reports being seen by Ottelin's PA and an ultrasound revealed a bladder mass.    Past/Anticipated interventions by urology, if any: TURBT done 05/22/2017.   Past/Anticipated interventions by medical oncology, if VBT:YOMAYOKHT with Shadad. Received carboplatin on 06/08/2017. Not scheduled at this time for second chemo.  Weight changes, if any: no  Bowel/Bladder complaints, if any: Reports hematuria and dysuria resolved after surgery. Describes a steady urine stream without difficulty emptying his bladder.   Nausea/Vomiting, if any: no  Pain issues, if any:  no  SAFETY ISSUES:  Prior radiation? no  Pacemaker/ICD? no  Possible current pregnancy? no  Is the patient on methotrexate? no  Current Complaints / other details:  84 year old male. Excellent shape. Plays golf regularly. Used to be an avid runner. Now walks at least one mile per day.

## 2017-06-19 NOTE — Progress Notes (Signed)
See progress note under physician encounter. 

## 2017-06-19 NOTE — Progress Notes (Signed)
GU Location of Tumor / Histology: high grade invasive urothelial carcinoma arising from bladder diagnosed December 2018.    Douglas Wood reports after playing a round of golf in September 2018 he experience painful hematuria. He made an appointment with his urologist, Dr. Karsten Ro for evaluation. He reports being seen by Ottelin's PA and an ultrasound revealed a bladder mass.    Past/Anticipated interventions by urology, if any: TURBT done 05/22/2017.   Past/Anticipated interventions by medical oncology, if NLG:XQJJHERDE with Shadad. Received carboplatin on 06/08/2017. Not scheduled at this time for second chemo.  Weight changes, if any: no  Bowel/Bladder complaints, if any: Reports hematuria and dysuria resolved after surgery. Describes a steady urine stream without difficulty emptying his bladder.   Nausea/Vomiting, if any: no  Pain issues, if any:  no  SAFETY ISSUES:  Prior radiation? no  Pacemaker/ICD? no  Possible current pregnancy? no  Is the patient on methotrexate? no  Current Complaints / other details:  84 year old male. Excellent shape. Plays golf regularly. Used to be an avid runner. Now walks at least one mile per day.

## 2017-06-20 ENCOUNTER — Ambulatory Visit
Admission: RE | Admit: 2017-06-20 | Discharge: 2017-06-20 | Disposition: A | Discharge status: Home / Self Care | Payer: Medicare Other | Source: Ambulatory Visit | Attending: Radiation Oncology | Admitting: Radiation Oncology

## 2017-06-20 ENCOUNTER — Encounter: Payer: Self-pay | Admitting: Radiation Oncology

## 2017-06-20 DIAGNOSIS — Z510 Encounter for antineoplastic radiation therapy: Secondary | ICD-10-CM | POA: Insufficient documentation

## 2017-06-20 DIAGNOSIS — C672 Malignant neoplasm of lateral wall of bladder: Secondary | ICD-10-CM | POA: Insufficient documentation

## 2017-06-22 ENCOUNTER — Inpatient Hospital Stay: Payer: Medicare Other

## 2017-06-22 VITALS — BP 125/80 | HR 68 | Temp 98.6°F | Resp 16 | Wt 155.0 lb

## 2017-06-22 DIAGNOSIS — Z5111 Encounter for antineoplastic chemotherapy: Secondary | ICD-10-CM | POA: Diagnosis not present

## 2017-06-22 DIAGNOSIS — C679 Malignant neoplasm of bladder, unspecified: Secondary | ICD-10-CM

## 2017-06-22 LAB — CMP (CANCER CENTER ONLY)
ALK PHOS: 76 U/L (ref 40–150)
ALT: 19 U/L (ref 0–55)
ANION GAP: 11 (ref 3–11)
AST: 19 U/L (ref 5–34)
Albumin: 3.9 g/dL (ref 3.5–5.0)
BILIRUBIN TOTAL: 0.7 mg/dL (ref 0.2–1.2)
BUN: 18 mg/dL (ref 7–26)
CALCIUM: 9.4 mg/dL (ref 8.4–10.4)
CO2: 24 mmol/L (ref 22–29)
Chloride: 105 mmol/L (ref 98–109)
Creatinine: 1.06 mg/dL (ref 0.70–1.30)
GFR, Estimated: >60 mL/min (ref >60)
GLUCOSE: 102 mg/dL (ref 70–140)
Potassium: 4.5 mmol/L (ref 3.5–5.1)
Sodium: 140 mmol/L (ref 136–145)
TOTAL PROTEIN: 6.9 g/dL (ref 6.4–8.3)

## 2017-06-22 LAB — CBC WITH DIFFERENTIAL (CANCER CENTER ONLY)
Basophils Absolute: 0 10*3/uL (ref 0.0–0.1)
Basophils Relative: 0 %
Eosinophils Absolute: 0.1 10*3/uL (ref 0.0–0.5)
Eosinophils Relative: 2 %
HEMATOCRIT: 41.5 % (ref 38.4–49.9)
HEMOGLOBIN: 13.6 g/dL (ref 13.0–17.1)
LYMPHS ABS: 1.5 10*3/uL (ref 0.9–3.3)
Lymphocytes Relative: 27 %
MCH: 30.6 pg (ref 27.2–33.4)
MCHC: 32.8 g/dL (ref 32.0–36.0)
MCV: 93.3 fL (ref 79.3–98.0)
MONOS PCT: 13 %
Monocytes Absolute: 0.7 10*3/uL (ref 0.1–0.9)
NEUTROS ABS: 3.2 10*3/uL (ref 1.5–6.5)
NEUTROS PCT: 58 %
Platelet Count: 211 10*3/uL (ref 140–400)
RBC: 4.45 MIL/uL (ref 4.20–5.82)
RDW: 13.4 % (ref 11.0–14.6)
WBC Count: 5.4 10*3/uL (ref 4.0–10.3)

## 2017-06-22 MED ORDER — DEXAMETHASONE SODIUM PHOSPHATE 10 MG/ML IJ SOLN
10.0000 mg | Freq: Once | INTRAMUSCULAR | Status: AC
  Administered 2017-06-22: 10 mg via INTRAVENOUS

## 2017-06-22 MED ORDER — CARBOPLATIN CHEMO INJECTION 450 MG/45ML
160.0000 mg | Freq: Once | INTRAVENOUS | Status: AC
  Administered 2017-06-22: 160 mg via INTRAVENOUS
  Filled 2017-06-22: qty 16

## 2017-06-22 MED ORDER — DEXAMETHASONE SODIUM PHOSPHATE 10 MG/ML IJ SOLN
INTRAMUSCULAR | Status: AC
  Filled 2017-06-22: qty 1

## 2017-06-22 MED ORDER — SODIUM CHLORIDE 0.9 % IV SOLN
Freq: Once | INTRAVENOUS | Status: AC
  Administered 2017-06-22: 10:00:00 via INTRAVENOUS

## 2017-06-22 MED ORDER — PALONOSETRON HCL INJECTION 0.25 MG/5ML
0.2500 mg | Freq: Once | INTRAVENOUS | Status: AC
  Administered 2017-06-22: 0.25 mg via INTRAVENOUS

## 2017-06-22 MED ORDER — PALONOSETRON HCL INJECTION 0.25 MG/5ML
INTRAVENOUS | Status: AC
  Filled 2017-06-22: qty 5

## 2017-06-22 NOTE — Progress Notes (Signed)
  Radiation Oncology         [phone] ________________________________  Name: Douglas Wood MRN: [id number]  Date: 06/20/2017  DOB: 1931-12-23  SIMULATION AND TREATMENT PLANNING NOTE    ICD-10-CM   1. Cancer of lateral wall of urinary bladder (HCC) C67.2     DIAGNOSIS:  84 y.o. gentleman with high grade T2N0 urothelial carcinoma of the lateral wall of the bladder  NARRATIVE:  The patient was brought to the [facility].  Identity was confirmed.  All relevant records and images related to the planned course of therapy were reviewed.  The patient freely provided informed written consent to proceed with treatment after reviewing the details related to the planned course of therapy. The consent form was witnessed and verified by the simulation staff.  Then, the patient was set-up in a stable reproducible  supine position for radiation therapy.  Contrast was instilled into the bladder with a catheter under sterile conditions.  CT images were obtained.  Surface markings were placed.  The CT images were loaded into the planning software.  Then the target and avoidance structures were contoured.  Treatment planning then occurred.  The radiation prescription was entered and confirmed.  Then, I designed and supervised the construction of a total of 5 medically necessary complex treatment devices including VacLoc body positioner and 4 MLCs to shield the bowel and femoral necks.  I have requested : 3D Simulation  I have requested a DVH of the following structures: small bowel, rectum, left femoral head, right femoral head and targets.  SPECIAL TREATMENT PROCEDURE:  The planned course of therapy using radiation constitutes a special treatment procedure. Special care is required in the management of this patient for the following reasons. This treatment constitutes a Special Treatment Procedure for the following reason: [ Concurrent chemotherapy requiring careful monitoring for increased  toxicities of treatment including weekly laboratory values..  The special nature of the planned course of radiotherapy will require increased physician supervision and oversight to ensure patient's safety with optimal treatment outcomes.  PLAN:  The patient will receive 45 Gy in 25 fractions to the bladder and pelvic nodes, followed by a boost to the bladder tumor to a total dose of 64.8 Gy.  ________________________________  Sheral Apley Tammi Klippel, M.D.

## 2017-06-22 NOTE — Progress Notes (Signed)
Treatment given per orders. Patient tolerated it well without problems. Vitals stable and discharged home from clinic ambulatory. Follow up as scheduled.  

## 2017-06-22 NOTE — Patient Instructions (Signed)
[facility] Discharge Instructions for Patients Receiving Chemotherapy  Today you received the following chemotherapy agents Carboplatin  To help prevent nausea and vomiting after your treatment, we encourage you to take your nausea medication as directed   If you develop nausea and vomiting that is not controlled by your nausea medication, call the clinic.   BELOW ARE SYMPTOMS THAT SHOULD BE REPORTED IMMEDIATELY:  *FEVER GREATER THAN 100.5 F  *CHILLS WITH OR WITHOUT FEVER  NAUSEA AND VOMITING THAT IS NOT CONTROLLED WITH YOUR NAUSEA MEDICATION  *UNUSUAL SHORTNESS OF BREATH  *UNUSUAL BRUISING OR BLEEDING  TENDERNESS IN MOUTH AND THROAT WITH OR WITHOUT PRESENCE OF ULCERS  *URINARY PROBLEMS  *BOWEL PROBLEMS  UNUSUAL RASH Items with * indicate a potential emergency and should be followed up as soon as possible.  Feel free to call the clinic should you have any questions or concerns. The clinic phone number is [phone].  Please show the CHEMO ALERT CARD at check-in to the Emergency Department and triage nurse.   

## 2017-06-23 DIAGNOSIS — C672 Malignant neoplasm of lateral wall of bladder: Secondary | ICD-10-CM | POA: Insufficient documentation

## 2017-06-23 DIAGNOSIS — C678 Malignant neoplasm of overlapping sites of bladder: Secondary | ICD-10-CM | POA: Insufficient documentation

## 2017-06-23 DIAGNOSIS — Z510 Encounter for antineoplastic radiation therapy: Secondary | ICD-10-CM | POA: Insufficient documentation

## 2017-06-26 DIAGNOSIS — C672 Malignant neoplasm of lateral wall of bladder: Secondary | ICD-10-CM | POA: Diagnosis not present

## 2017-06-26 DIAGNOSIS — C678 Malignant neoplasm of overlapping sites of bladder: Secondary | ICD-10-CM | POA: Diagnosis not present

## 2017-06-26 DIAGNOSIS — Z510 Encounter for antineoplastic radiation therapy: Secondary | ICD-10-CM | POA: Diagnosis not present

## 2017-06-27 ENCOUNTER — Other Ambulatory Visit: Payer: Self-pay | Admitting: Oncology

## 2017-06-28 ENCOUNTER — Ambulatory Visit
Admission: RE | Admit: 2017-06-28 | Discharge: 2017-06-28 | Disposition: A | Discharge status: Home / Self Care | Payer: Medicare Other | Source: Ambulatory Visit | Attending: Radiation Oncology | Admitting: Radiation Oncology

## 2017-06-28 DIAGNOSIS — Z510 Encounter for antineoplastic radiation therapy: Secondary | ICD-10-CM | POA: Diagnosis not present

## 2017-06-29 ENCOUNTER — Ambulatory Visit
Admission: RE | Admit: 2017-06-29 | Discharge: 2017-06-29 | Disposition: A | Discharge status: Home / Self Care | Payer: Medicare Other | Source: Ambulatory Visit | Attending: Radiation Oncology | Admitting: Radiation Oncology

## 2017-06-29 ENCOUNTER — Inpatient Hospital Stay: Payer: Medicare Other

## 2017-06-29 ENCOUNTER — Inpatient Hospital Stay: Payer: Medicare Other | Attending: Oncology

## 2017-06-29 VITALS — BP 153/79 | HR 67 | Temp 97.8°F | Resp 18

## 2017-06-29 DIAGNOSIS — C672 Malignant neoplasm of lateral wall of bladder: Secondary | ICD-10-CM

## 2017-06-29 DIAGNOSIS — C679 Malignant neoplasm of bladder, unspecified: Secondary | ICD-10-CM | POA: Diagnosis not present

## 2017-06-29 DIAGNOSIS — N289 Disorder of kidney and ureter, unspecified: Secondary | ICD-10-CM | POA: Insufficient documentation

## 2017-06-29 DIAGNOSIS — Z5111 Encounter for antineoplastic chemotherapy: Secondary | ICD-10-CM | POA: Insufficient documentation

## 2017-06-29 DIAGNOSIS — Z79899 Other long term (current) drug therapy: Secondary | ICD-10-CM | POA: Insufficient documentation

## 2017-06-29 DIAGNOSIS — Z510 Encounter for antineoplastic radiation therapy: Secondary | ICD-10-CM | POA: Diagnosis not present

## 2017-06-29 DIAGNOSIS — R197 Diarrhea, unspecified: Secondary | ICD-10-CM | POA: Diagnosis not present

## 2017-06-29 LAB — CMP (CANCER CENTER ONLY)
ALT: 17 U/L (ref 0–55)
AST: 18 U/L (ref 5–34)
Albumin: 4 g/dL (ref 3.5–5.0)
Alkaline Phosphatase: 66 U/L (ref 40–150)
Anion gap: 8 (ref 3–11)
BUN: 19 mg/dL (ref 7–26)
CO2: 26 mmol/L (ref 22–29)
CREATININE: 0.96 mg/dL (ref 0.70–1.30)
Calcium: 9.5 mg/dL (ref 8.4–10.4)
Chloride: 103 mmol/L (ref 98–109)
GFR, Est AFR Am: >60 mL/min (ref >60)
GFR, Estimated: >60 mL/min (ref >60)
Glucose, Bld: 86 mg/dL (ref 70–140)
POTASSIUM: 4.6 mmol/L (ref 3.5–5.1)
SODIUM: 137 mmol/L (ref 136–145)
Total Bilirubin: 0.7 mg/dL (ref 0.2–1.2)
Total Protein: 7.2 g/dL (ref 6.4–8.3)

## 2017-06-29 LAB — CBC WITH DIFFERENTIAL (CANCER CENTER ONLY)
Basophils Absolute: 0 10*3/uL (ref 0.0–0.1)
Basophils Relative: 1 %
EOS ABS: 0.1 10*3/uL (ref 0.0–0.5)
EOS PCT: 3 %
HCT: 42 % (ref 38.4–49.9)
HEMOGLOBIN: 13.8 g/dL (ref 13.0–17.1)
LYMPHS ABS: 1.1 10*3/uL (ref 0.9–3.3)
LYMPHS PCT: 33 %
MCH: 30.7 pg (ref 27.2–33.4)
MCHC: 32.9 g/dL (ref 32.0–36.0)
MCV: 93.5 fL (ref 79.3–98.0)
MONOS PCT: 16 %
Monocytes Absolute: 0.5 10*3/uL (ref 0.1–0.9)
NEUTROS PCT: 47 %
Neutro Abs: 1.6 10*3/uL (ref 1.5–6.5)
Platelet Count: 208 10*3/uL (ref 140–400)
RBC: 4.49 MIL/uL (ref 4.20–5.82)
RDW: 13.8 % (ref 11.0–14.6)
WBC Count: 3.4 10*3/uL — ABNORMAL LOW (ref 4.0–10.3)

## 2017-06-29 MED ORDER — DEXAMETHASONE SODIUM PHOSPHATE 10 MG/ML IJ SOLN
INTRAMUSCULAR | Status: AC
  Filled 2017-06-29: qty 1

## 2017-06-29 MED ORDER — PALONOSETRON HCL INJECTION 0.25 MG/5ML
INTRAVENOUS | Status: AC
  Filled 2017-06-29: qty 5

## 2017-06-29 MED ORDER — DEXAMETHASONE SODIUM PHOSPHATE 10 MG/ML IJ SOLN
10.0000 mg | Freq: Once | INTRAMUSCULAR | Status: AC
  Administered 2017-06-29: 10 mg via INTRAVENOUS

## 2017-06-29 MED ORDER — PALONOSETRON HCL INJECTION 0.25 MG/5ML
0.2500 mg | Freq: Once | INTRAVENOUS | Status: AC
  Administered 2017-06-29: 0.25 mg via INTRAVENOUS

## 2017-06-29 MED ORDER — SODIUM CHLORIDE 0.9 % IV SOLN
Freq: Once | INTRAVENOUS | Status: AC
Start: 2017-06-29 — End: 2017-06-29
  Administered 2017-06-29: 12:00:00 via INTRAVENOUS

## 2017-06-29 MED ORDER — SODIUM CHLORIDE 0.9 % IV SOLN
158.8000 mg | Freq: Once | INTRAVENOUS | Status: AC
  Administered 2017-06-29: 160 mg via INTRAVENOUS
  Filled 2017-06-29: qty 16

## 2017-06-30 ENCOUNTER — Ambulatory Visit
Admission: RE | Admit: 2017-06-30 | Discharge: 2017-06-30 | Disposition: A | Discharge status: Home / Self Care | Payer: Medicare Other | Source: Ambulatory Visit | Attending: Radiation Oncology | Admitting: Radiation Oncology

## 2017-06-30 DIAGNOSIS — Z510 Encounter for antineoplastic radiation therapy: Secondary | ICD-10-CM | POA: Diagnosis not present

## 2017-06-30 NOTE — Progress Notes (Signed)
Pt here for patient teaching.  Pt given Radiation and You booklet.  Reviewed areas of pertinence such as diarrhea, fatigue and urinary and bladder changes . Pt able to give teach back of have Imodium on hand and drink plenty of water,. Pt demonstrated understanding, needs reinforcement, no evidence of learning, refused teaching and  of information given and will contact nursing with any questions or concerns.     [email or web address]      

## 2017-07-03 ENCOUNTER — Ambulatory Visit
Admission: RE | Admit: 2017-07-03 | Discharge: 2017-07-03 | Disposition: A | Discharge status: Home / Self Care | Payer: Medicare Other | Source: Ambulatory Visit | Attending: Radiation Oncology | Admitting: Radiation Oncology

## 2017-07-03 DIAGNOSIS — Z510 Encounter for antineoplastic radiation therapy: Secondary | ICD-10-CM | POA: Diagnosis not present

## 2017-07-04 ENCOUNTER — Ambulatory Visit
Admission: RE | Admit: 2017-07-04 | Discharge: 2017-07-04 | Disposition: A | Discharge status: Home / Self Care | Payer: Medicare Other | Source: Ambulatory Visit | Attending: Radiation Oncology | Admitting: Radiation Oncology

## 2017-07-04 DIAGNOSIS — Z510 Encounter for antineoplastic radiation therapy: Secondary | ICD-10-CM | POA: Diagnosis not present

## 2017-07-05 ENCOUNTER — Ambulatory Visit
Admission: RE | Admit: 2017-07-05 | Discharge: 2017-07-05 | Disposition: A | Discharge status: Home / Self Care | Payer: Medicare Other | Source: Ambulatory Visit | Attending: Radiation Oncology | Admitting: Radiation Oncology

## 2017-07-05 DIAGNOSIS — Z510 Encounter for antineoplastic radiation therapy: Secondary | ICD-10-CM | POA: Diagnosis not present

## 2017-07-06 ENCOUNTER — Inpatient Hospital Stay: Payer: Medicare Other

## 2017-07-06 ENCOUNTER — Ambulatory Visit
Admission: RE | Admit: 2017-07-06 | Discharge: 2017-07-06 | Disposition: A | Discharge status: Home / Self Care | Payer: Medicare Other | Source: Ambulatory Visit | Attending: Radiation Oncology | Admitting: Radiation Oncology

## 2017-07-06 VITALS — BP 139/77 | HR 83 | Temp 98.1°F | Resp 17

## 2017-07-06 DIAGNOSIS — C672 Malignant neoplasm of lateral wall of bladder: Secondary | ICD-10-CM

## 2017-07-06 DIAGNOSIS — Z510 Encounter for antineoplastic radiation therapy: Secondary | ICD-10-CM | POA: Diagnosis not present

## 2017-07-06 DIAGNOSIS — C679 Malignant neoplasm of bladder, unspecified: Secondary | ICD-10-CM

## 2017-07-06 DIAGNOSIS — Z5111 Encounter for antineoplastic chemotherapy: Secondary | ICD-10-CM | POA: Diagnosis not present

## 2017-07-06 LAB — CBC WITH DIFFERENTIAL (CANCER CENTER ONLY)
BASOS ABS: 0 10*3/uL (ref 0.0–0.1)
Basophils Relative: 0 %
EOS PCT: 2 %
Eosinophils Absolute: 0.1 10*3/uL (ref 0.0–0.5)
HEMATOCRIT: 39.6 % (ref 38.4–49.9)
HEMOGLOBIN: 13.1 g/dL (ref 13.0–17.1)
LYMPHS ABS: 0.6 10*3/uL — AB (ref 0.9–3.3)
LYMPHS PCT: 16 %
MCH: 31 pg (ref 27.2–33.4)
MCHC: 33.1 g/dL (ref 32.0–36.0)
MCV: 93.6 fL (ref 79.3–98.0)
Monocytes Absolute: 0.4 10*3/uL (ref 0.1–0.9)
Monocytes Relative: 12 %
NEUTROS ABS: 2.4 10*3/uL (ref 1.5–6.5)
Neutrophils Relative %: 70 %
PLATELETS: 152 10*3/uL (ref 140–400)
RBC: 4.23 MIL/uL (ref 4.20–5.82)
RDW: 13.7 % (ref 11.0–14.6)
WBC Count: 3.5 10*3/uL — ABNORMAL LOW (ref 4.0–10.3)

## 2017-07-06 LAB — CMP (CANCER CENTER ONLY)
ALBUMIN: 3.8 g/dL (ref 3.5–5.0)
ALT: 14 U/L (ref 0–55)
ANION GAP: 8 (ref 3–11)
AST: 16 U/L (ref 5–34)
Alkaline Phosphatase: 62 U/L (ref 40–150)
BILIRUBIN TOTAL: 0.8 mg/dL (ref 0.2–1.2)
BUN: 28 mg/dL — AB (ref 7–26)
CHLORIDE: 107 mmol/L (ref 98–109)
CO2: 23 mmol/L (ref 22–29)
Calcium: 9.5 mg/dL (ref 8.4–10.4)
Creatinine: 1.03 mg/dL (ref 0.70–1.30)
GFR, Est AFR Am: >60 mL/min (ref >60)
Glucose, Bld: 103 mg/dL (ref 70–140)
POTASSIUM: 4.5 mmol/L (ref 3.5–5.1)
Sodium: 138 mmol/L (ref 136–145)
Total Protein: 6.8 g/dL (ref 6.4–8.3)

## 2017-07-06 MED ORDER — SODIUM CHLORIDE 0.9 % IV SOLN
155.6000 mg | Freq: Once | INTRAVENOUS | Status: AC
  Administered 2017-07-06: 160 mg via INTRAVENOUS
  Filled 2017-07-06: qty 16

## 2017-07-06 MED ORDER — PALONOSETRON HCL INJECTION 0.25 MG/5ML
INTRAVENOUS | Status: AC
  Filled 2017-07-06: qty 5

## 2017-07-06 MED ORDER — DEXAMETHASONE SODIUM PHOSPHATE 10 MG/ML IJ SOLN
INTRAMUSCULAR | Status: AC
  Filled 2017-07-06: qty 1

## 2017-07-06 MED ORDER — SODIUM CHLORIDE 0.9 % IV SOLN
Freq: Once | INTRAVENOUS | Status: AC
  Administered 2017-07-06: 10:00:00 via INTRAVENOUS

## 2017-07-06 MED ORDER — PALONOSETRON HCL INJECTION 0.25 MG/5ML
0.2500 mg | Freq: Once | INTRAVENOUS | Status: AC
  Administered 2017-07-06: 0.25 mg via INTRAVENOUS

## 2017-07-06 MED ORDER — DEXAMETHASONE SODIUM PHOSPHATE 10 MG/ML IJ SOLN
10.0000 mg | Freq: Once | INTRAMUSCULAR | Status: AC
  Administered 2017-07-06: 10 mg via INTRAVENOUS

## 2017-07-06 NOTE — Patient Instructions (Signed)
[facility] Discharge Instructions for Patients Receiving Chemotherapy  Today you received the following chemotherapy agents: Carboplatin (Paraplatin).  To help prevent nausea and vomiting after your treatment, we encourage you to take your nausea medication as prescribed. Received Aloxi during treatment-->take Compazine (not Zofran) for the next 3 days.  If you develop nausea and vomiting that is not controlled by your nausea medication, call the clinic.   BELOW ARE SYMPTOMS THAT SHOULD BE REPORTED IMMEDIATELY:  *FEVER GREATER THAN 100.5 F  *CHILLS WITH OR WITHOUT FEVER  NAUSEA AND VOMITING THAT IS NOT CONTROLLED WITH YOUR NAUSEA MEDICATION  *UNUSUAL SHORTNESS OF BREATH  *UNUSUAL BRUISING OR BLEEDING  TENDERNESS IN MOUTH AND THROAT WITH OR WITHOUT PRESENCE OF ULCERS  *URINARY PROBLEMS  *BOWEL PROBLEMS  UNUSUAL RASH Items with * indicate a potential emergency and should be followed up as soon as possible.  Feel free to call the clinic should you have any questions or concerns. The clinic phone number is [phone].  Please show the [place] at check-in to the Emergency Department and triage nurse.

## 2017-07-07 ENCOUNTER — Ambulatory Visit
Admission: RE | Admit: 2017-07-07 | Discharge: 2017-07-07 | Disposition: A | Discharge status: Home / Self Care | Payer: Medicare Other | Source: Ambulatory Visit | Attending: Radiation Oncology | Admitting: Radiation Oncology

## 2017-07-07 DIAGNOSIS — Z510 Encounter for antineoplastic radiation therapy: Secondary | ICD-10-CM | POA: Diagnosis not present

## 2017-07-10 ENCOUNTER — Telehealth: Payer: Self-pay | Admitting: Radiation Oncology

## 2017-07-10 ENCOUNTER — Ambulatory Visit: Admission: RE | Admit: 2017-07-10 | Payer: Medicare Other | Source: Ambulatory Visit

## 2017-07-10 NOTE — Telephone Encounter (Signed)
Received voicemail message from patient requesting a return call. Phoned patient back to inquire about needs. Patient reports rectal pain and irritation. Also, patient reports diarrhea. Patient reports rectal pain was present before diarrhea but is worse now. Reviewed proper Imodium use. Patient confirms he is taking Imodium correctly. Also, patient reports using Aquaphor, baby wipe and hydrocortisone on his rectum. Discuss with patient use of TUCKS wipes, baby wipes with aloe, preparation H and sitz baths. Patient states, "I just feel like I need something stronger than over the counter stuff for my rectal pain. " Patient has a history of hemorrhoids. Patient has received 8 of 25 intended radiation treatments for invasive high grade papillary urothelial carcinoma with muscularis propria involvement. Patient understands this RN will discuss these findings with the providers then phone him back with further directions.

## 2017-07-11 ENCOUNTER — Other Ambulatory Visit: Payer: Self-pay | Admitting: Urology

## 2017-07-11 ENCOUNTER — Telehealth: Payer: Self-pay | Admitting: Radiation Oncology

## 2017-07-11 ENCOUNTER — Ambulatory Visit
Admission: RE | Admit: 2017-07-11 | Discharge: 2017-07-11 | Disposition: A | Discharge status: Home / Self Care | Payer: Medicare Other | Source: Ambulatory Visit | Attending: Radiation Oncology | Admitting: Radiation Oncology

## 2017-07-11 DIAGNOSIS — K6289 Other specified diseases of anus and rectum: Secondary | ICD-10-CM | POA: Insufficient documentation

## 2017-07-11 DIAGNOSIS — Z510 Encounter for antineoplastic radiation therapy: Secondary | ICD-10-CM | POA: Diagnosis not present

## 2017-07-11 MED ORDER — HYDROCORTISONE ACETATE 30 MG RE SUPP: 1.0000 | Freq: Two times a day (BID) | RECTAL | 0 refills | Status: AC | PRN

## 2017-07-11 NOTE — Telephone Encounter (Signed)
Sam, Please let Mr. Reppert know that I am happy to send a Rx for proctocort suppositories to help with rectal pain. If his pain persists or worsens despite treatment, he will need to be evaluated with GI. -Brannon Decaire

## 2017-07-11 NOTE — Telephone Encounter (Signed)
Per PA  Bruning's request I phoned the patient informing him hydrocortisone suppositories have been electronically prescribed to [facility], [place] for his rectal pain. Patient verbalized understanding and expressed appreciation for the assistance. Patient reports episodes of diarrhea are less along with rectal pain. Patient understands this RN will have a low residue diet handout and diarrhea management handouts waiting for him tomorrow morning at the treatment machine.

## 2017-07-12 ENCOUNTER — Ambulatory Visit
Admission: RE | Admit: 2017-07-12 | Discharge: 2017-07-12 | Disposition: A | Discharge status: Home / Self Care | Payer: Medicare Other | Source: Ambulatory Visit | Attending: Radiation Oncology | Admitting: Radiation Oncology

## 2017-07-12 DIAGNOSIS — Z510 Encounter for antineoplastic radiation therapy: Secondary | ICD-10-CM | POA: Diagnosis not present

## 2017-07-13 ENCOUNTER — Inpatient Hospital Stay: Payer: Medicare Other

## 2017-07-13 ENCOUNTER — Ambulatory Visit
Admission: RE | Admit: 2017-07-13 | Discharge: 2017-07-13 | Disposition: A | Discharge status: Home / Self Care | Payer: Medicare Other | Source: Ambulatory Visit | Attending: Radiation Oncology | Admitting: Radiation Oncology

## 2017-07-13 ENCOUNTER — Telehealth: Payer: Self-pay

## 2017-07-13 VITALS — BP 134/80 | HR 81 | Temp 98.3°F | Resp 17

## 2017-07-13 DIAGNOSIS — Z510 Encounter for antineoplastic radiation therapy: Secondary | ICD-10-CM | POA: Diagnosis not present

## 2017-07-13 DIAGNOSIS — Z5111 Encounter for antineoplastic chemotherapy: Secondary | ICD-10-CM | POA: Diagnosis not present

## 2017-07-13 DIAGNOSIS — C672 Malignant neoplasm of lateral wall of bladder: Secondary | ICD-10-CM

## 2017-07-13 DIAGNOSIS — C679 Malignant neoplasm of bladder, unspecified: Secondary | ICD-10-CM

## 2017-07-13 LAB — CBC WITH DIFFERENTIAL (CANCER CENTER ONLY)
BASOS ABS: 0 10*3/uL (ref 0.0–0.1)
BASOS PCT: 0 %
EOS ABS: 0 10*3/uL (ref 0.0–0.5)
Eosinophils Relative: 1 %
HEMATOCRIT: 41.1 % (ref 38.4–49.9)
HEMOGLOBIN: 13.8 g/dL (ref 13.0–17.1)
Lymphocytes Relative: 8 %
Lymphs Abs: 0.3 10*3/uL — ABNORMAL LOW (ref 0.9–3.3)
MCH: 31 pg (ref 27.2–33.4)
MCHC: 33.5 g/dL (ref 32.0–36.0)
MCV: 92.6 fL (ref 79.3–98.0)
MONOS PCT: 21 %
Monocytes Absolute: 0.6 10*3/uL (ref 0.1–0.9)
Neutro Abs: 2.2 10*3/uL (ref 1.5–6.5)
Neutrophils Relative %: 70 %
Platelet Count: 141 10*3/uL (ref 140–400)
RBC: 4.43 MIL/uL (ref 4.20–5.82)
RDW: 14.2 % (ref 11.0–14.6)
WBC: 3.1 10*3/uL — AB (ref 4.0–10.3)

## 2017-07-13 LAB — CMP (CANCER CENTER ONLY)
ALK PHOS: 68 U/L (ref 40–150)
ALT: 15 U/L (ref 0–55)
ANION GAP: 10 (ref 3–11)
AST: 16 U/L (ref 5–34)
Albumin: 3.7 g/dL (ref 3.5–5.0)
BILIRUBIN TOTAL: 0.8 mg/dL (ref 0.2–1.2)
BUN: 21 mg/dL (ref 7–26)
CALCIUM: 9.4 mg/dL (ref 8.4–10.4)
CO2: 22 mmol/L (ref 22–29)
CREATININE: 1.02 mg/dL (ref 0.70–1.30)
Chloride: 107 mmol/L (ref 98–109)
Glucose, Bld: 110 mg/dL (ref 70–140)
Potassium: 4.1 mmol/L (ref 3.5–5.1)
SODIUM: 139 mmol/L (ref 136–145)
TOTAL PROTEIN: 6.8 g/dL (ref 6.4–8.3)

## 2017-07-13 MED ORDER — DEXAMETHASONE SODIUM PHOSPHATE 10 MG/ML IJ SOLN
10.0000 mg | Freq: Once | INTRAMUSCULAR | Status: AC
  Administered 2017-07-13: 10 mg via INTRAVENOUS

## 2017-07-13 MED ORDER — PALONOSETRON HCL INJECTION 0.25 MG/5ML
INTRAVENOUS | Status: AC
  Filled 2017-07-13: qty 5

## 2017-07-13 MED ORDER — SODIUM CHLORIDE 0.9 % IV SOLN
156.6000 mg | Freq: Once | INTRAVENOUS | Status: AC
  Administered 2017-07-13: 160 mg via INTRAVENOUS
  Filled 2017-07-13: qty 16

## 2017-07-13 MED ORDER — DEXAMETHASONE SODIUM PHOSPHATE 10 MG/ML IJ SOLN
INTRAMUSCULAR | Status: AC
  Filled 2017-07-13: qty 1

## 2017-07-13 MED ORDER — SODIUM CHLORIDE 0.9 % IV SOLN
Freq: Once | INTRAVENOUS | Status: AC
Start: 2017-07-13 — End: 2017-07-13
  Administered 2017-07-13: 10:00:00 via INTRAVENOUS

## 2017-07-13 MED ORDER — PALONOSETRON HCL INJECTION 0.25 MG/5ML
0.2500 mg | Freq: Once | INTRAVENOUS | Status: AC
  Administered 2017-07-13: 0.25 mg via INTRAVENOUS

## 2017-07-13 NOTE — Patient Instructions (Signed)
[facility] Discharge Instructions for Patients Receiving Chemotherapy  Today you received the following chemotherapy agents: Carboplatin (Paraplatin).  To help prevent nausea and vomiting after your treatment, we encourage you to take your nausea medication as prescribed. Received Aloxi during treatment-->take Compazine (not Zofran) for the next 3 days.  If you develop nausea and vomiting that is not controlled by your nausea medication, call the clinic.   BELOW ARE SYMPTOMS THAT SHOULD BE REPORTED IMMEDIATELY:  *FEVER GREATER THAN 100.5 F  *CHILLS WITH OR WITHOUT FEVER  NAUSEA AND VOMITING THAT IS NOT CONTROLLED WITH YOUR NAUSEA MEDICATION  *UNUSUAL SHORTNESS OF BREATH  *UNUSUAL BRUISING OR BLEEDING  TENDERNESS IN MOUTH AND THROAT WITH OR WITHOUT PRESENCE OF ULCERS  *URINARY PROBLEMS  *BOWEL PROBLEMS  UNUSUAL RASH Items with * indicate a potential emergency and should be followed up as soon as possible.  Feel free to call the clinic should you have any questions or concerns. The clinic phone number is [phone].  Please show the [place] at check-in to the Emergency Department and triage nurse.

## 2017-07-13 NOTE — Telephone Encounter (Signed)
Patient reports " I'm half way through my chemotherapy and radiation treatment and I'm having some diarrhea. I started out on a high-fiber diet, but now I don't know what kind of diet I should be on. Everything is yellow. It looks like bile, it's just yellow." Patient reports "at least 5" episodes of diarrhea per day since yesterday. Patient denies pain, nausea, vomiting, or fever. Patient reports "every time I get ready to do something, here comes the yellow stuff. When I get up to pee, pass gas, or anything. I feel fine, I'm getting ready to go walk my dog, I just need to know what kind of diet to be on." Unable to reach nurse desk at time of call. Patient advised someone will return call on follow-up and to call back with changes or worsening symptoms. Patient verbalized understanding.

## 2017-07-14 ENCOUNTER — Ambulatory Visit
Admission: RE | Admit: 2017-07-14 | Discharge: 2017-07-14 | Disposition: A | Discharge status: Home / Self Care | Payer: Medicare Other | Source: Ambulatory Visit | Attending: Radiation Oncology | Admitting: Radiation Oncology

## 2017-07-14 DIAGNOSIS — Z510 Encounter for antineoplastic radiation therapy: Secondary | ICD-10-CM | POA: Diagnosis not present

## 2017-07-17 ENCOUNTER — Encounter: Payer: Self-pay | Admitting: *Deleted

## 2017-07-17 ENCOUNTER — Ambulatory Visit
Admission: RE | Admit: 2017-07-17 | Discharge: 2017-07-17 | Disposition: A | Discharge status: Home / Self Care | Payer: Medicare Other | Source: Ambulatory Visit | Attending: Radiation Oncology | Admitting: Radiation Oncology

## 2017-07-17 DIAGNOSIS — Z510 Encounter for antineoplastic radiation therapy: Secondary | ICD-10-CM | POA: Diagnosis not present

## 2017-07-18 ENCOUNTER — Ambulatory Visit
Admission: RE | Admit: 2017-07-18 | Discharge: 2017-07-18 | Disposition: A | Discharge status: Home / Self Care | Payer: Medicare Other | Source: Ambulatory Visit | Attending: Radiation Oncology | Admitting: Radiation Oncology

## 2017-07-18 DIAGNOSIS — Z510 Encounter for antineoplastic radiation therapy: Secondary | ICD-10-CM | POA: Diagnosis not present

## 2017-07-19 ENCOUNTER — Ambulatory Visit
Admission: RE | Admit: 2017-07-19 | Discharge: 2017-07-19 | Disposition: A | Discharge status: Home / Self Care | Payer: Medicare Other | Source: Ambulatory Visit | Attending: Radiation Oncology | Admitting: Radiation Oncology

## 2017-07-19 DIAGNOSIS — Z510 Encounter for antineoplastic radiation therapy: Secondary | ICD-10-CM | POA: Diagnosis not present

## 2017-07-20 ENCOUNTER — Inpatient Hospital Stay: Payer: Medicare Other

## 2017-07-20 ENCOUNTER — Telehealth: Payer: Self-pay | Admitting: Oncology

## 2017-07-20 ENCOUNTER — Other Ambulatory Visit: Payer: Medicare Other

## 2017-07-20 ENCOUNTER — Ambulatory Visit
Admission: RE | Admit: 2017-07-20 | Discharge: 2017-07-20 | Disposition: A | Discharge status: Home / Self Care | Payer: Medicare Other | Source: Ambulatory Visit | Attending: Radiation Oncology | Admitting: Radiation Oncology

## 2017-07-20 ENCOUNTER — Inpatient Hospital Stay (HOSPITAL_BASED_OUTPATIENT_CLINIC_OR_DEPARTMENT_OTHER): Payer: Medicare Other | Admitting: Oncology

## 2017-07-20 VITALS — BP 109/74 | HR 102 | Temp 98.0°F | Resp 18 | Ht 70.0 in | Wt 151.3 lb

## 2017-07-20 VITALS — HR 92

## 2017-07-20 DIAGNOSIS — C679 Malignant neoplasm of bladder, unspecified: Secondary | ICD-10-CM

## 2017-07-20 DIAGNOSIS — R197 Diarrhea, unspecified: Secondary | ICD-10-CM

## 2017-07-20 DIAGNOSIS — Z5111 Encounter for antineoplastic chemotherapy: Secondary | ICD-10-CM | POA: Diagnosis not present

## 2017-07-20 DIAGNOSIS — R110 Nausea: Secondary | ICD-10-CM | POA: Diagnosis not present

## 2017-07-20 DIAGNOSIS — C672 Malignant neoplasm of lateral wall of bladder: Secondary | ICD-10-CM

## 2017-07-20 DIAGNOSIS — Z510 Encounter for antineoplastic radiation therapy: Secondary | ICD-10-CM | POA: Diagnosis not present

## 2017-07-20 LAB — CBC WITH DIFFERENTIAL (CANCER CENTER ONLY)
Basophils Absolute: 0 10*3/uL (ref 0.0–0.1)
Basophils Relative: 0 %
EOS ABS: 0.1 10*3/uL (ref 0.0–0.5)
EOS PCT: 2 %
HCT: 38.3 % — ABNORMAL LOW (ref 38.4–49.9)
Hemoglobin: 12.9 g/dL — ABNORMAL LOW (ref 13.0–17.1)
LYMPHS ABS: 0.3 10*3/uL — AB (ref 0.9–3.3)
Lymphocytes Relative: 8 %
MCH: 31.3 pg (ref 27.2–33.4)
MCHC: 33.8 g/dL (ref 32.0–36.0)
MCV: 92.8 fL (ref 79.3–98.0)
MONO ABS: 0.6 10*3/uL (ref 0.1–0.9)
MONOS PCT: 18 %
Neutro Abs: 2.5 10*3/uL (ref 1.5–6.5)
Neutrophils Relative %: 72 %
PLATELETS: 181 10*3/uL (ref 140–400)
RBC: 4.13 MIL/uL — AB (ref 4.20–5.82)
RDW: 14.2 % (ref 11.0–14.6)
WBC: 3.4 10*3/uL — AB (ref 4.0–10.3)

## 2017-07-20 LAB — CMP (CANCER CENTER ONLY)
ALT: 13 U/L (ref 0–55)
AST: 12 U/L (ref 5–34)
Albumin: 3.2 g/dL — ABNORMAL LOW (ref 3.5–5.0)
Alkaline Phosphatase: 68 U/L (ref 40–150)
Anion gap: 10 (ref 3–11)
BUN: 17 mg/dL (ref 7–26)
CHLORIDE: 106 mmol/L (ref 98–109)
CO2: 26 mmol/L (ref 22–29)
CREATININE: 0.88 mg/dL (ref 0.70–1.30)
Calcium: 9.2 mg/dL (ref 8.4–10.4)
GFR, Est AFR Am: >60 mL/min (ref >60)
Glucose, Bld: 119 mg/dL (ref 70–140)
Potassium: 3.6 mmol/L (ref 3.5–5.1)
Sodium: 142 mmol/L (ref 136–145)
Total Bilirubin: 0.5 mg/dL (ref 0.2–1.2)
Total Protein: 6.7 g/dL (ref 6.4–8.3)

## 2017-07-20 MED ORDER — DEXAMETHASONE SODIUM PHOSPHATE 10 MG/ML IJ SOLN
INTRAMUSCULAR | Status: AC
  Filled 2017-07-20: qty 1

## 2017-07-20 MED ORDER — SODIUM CHLORIDE 0.9 % IV SOLN
158.8000 mg | Freq: Once | INTRAVENOUS | Status: AC
  Administered 2017-07-20: 160 mg via INTRAVENOUS
  Filled 2017-07-20: qty 16

## 2017-07-20 MED ORDER — HYDROCORTISONE ACE-PRAMOXINE 1-1 % RE CREA: 1.0000 "application " | TOPICAL_CREAM | Freq: Two times a day (BID) | RECTAL | 1 refills | Status: DC

## 2017-07-20 MED ORDER — SODIUM CHLORIDE 0.9 % IV SOLN
Freq: Once | INTRAVENOUS | Status: AC
  Administered 2017-07-20: 10:00:00 via INTRAVENOUS

## 2017-07-20 MED ORDER — PALONOSETRON HCL INJECTION 0.25 MG/5ML
INTRAVENOUS | Status: AC
  Filled 2017-07-20: qty 5

## 2017-07-20 MED ORDER — DEXAMETHASONE SODIUM PHOSPHATE 10 MG/ML IJ SOLN
10.0000 mg | Freq: Once | INTRAMUSCULAR | Status: AC
  Administered 2017-07-20: 10 mg via INTRAVENOUS

## 2017-07-20 MED ORDER — PALONOSETRON HCL INJECTION 0.25 MG/5ML
0.2500 mg | Freq: Once | INTRAVENOUS | Status: AC
  Administered 2017-07-20: 0.25 mg via INTRAVENOUS

## 2017-07-20 NOTE — Telephone Encounter (Signed)
Scheduled appt per 3/28 los - Gave patient AVS and calender per los.  

## 2017-07-20 NOTE — Progress Notes (Signed)
Hematology and Oncology Follow Up Visit  Douglas Wood [id number] Dec 14, 1931 84 y.o. 07/20/2017 8:50 AM Douglas Wood, MDShaw, Douglas May, MD   Principle Diagnosis: 84 year old man with T2N0 high-grade invasive urothelial carcinoma of the bladder diagnosed in December 2018.  He does not have any evidence of metastatic disease.   Prior Therapy: He S/P TURBT on April 14, 2017 under the care of Dr. Karsten Ro for a 5.5 cm tumor involving the right wall of the bladder.    Current therapy: Under evaluation to start radiation with chemotherapy.  He will receive carboplatin AUC of 2 on a weekly basis.  Interim History: [name] presents today for a follow-up visit.  Since the last visit, he had a prolonged episode of diarrhea that lasted close to 7 days.  He has been taking Imodium which have helped his symptoms.  He is no longer having any loose bowel movements at this time.  In the process, he lost 4 pounds but he has regained all activities of daily living including walking daily for a mile and a half.  He denies any nausea, vomiting or abdominal distention.  He denies any peripheral neuropathy or worsening neurological symptoms.  He continues to have irritation around his rectum that has been chronic in nature.  He has been using hydrocortisone cream at this time.   He does not report any headaches, blurry vision, syncope or seizures. Does not report any fevers, chills or sweats.  Does not report any cough, wheezing or hemoptysis.  Does not report any chest pain, palpitation, orthopnea or leg edema.  Does not report any nausea, vomiting or abdominal pain.  She does not report any constipation or diarrhea.  Does not report any skeletal complaints.    Does not report frequency, urgency or hematuria.  Does not report any skin rashes or lesions. Does not report any heat or cold intolerance.  Does not report any lymphadenopathy or petechiae.  Does not report any anxiety or depression.  Remaining review of  systems is negative.    Medications: I have reviewed the patient's current medications.  Current Outpatient Medications  Medication Sig Dispense Refill  . diazepam (VALIUM) 5 MG tablet Take 2.5-5 mg by mouth at bedtime as needed for anxiety. Takes 2.5 mg in the morning and 5 mg at bedtime as needed.    Marland Kitchen HYDROCORTISONE ACE, RECTAL, 30 MG SUPP Place 1 suppository (30 mg total) rectally 2 (two) times daily as needed (for rectal pain). 60 each 0  . losartan (COZAAR) 50 MG tablet   0  . omeprazole (PRILOSEC) 20 MG capsule Take 20 mg by mouth 2 (two) times daily.     . prochlorperazine (COMPAZINE) 10 MG tablet Take 10 mg by mouth every 6 (six) hours as needed for nausea or vomiting.    . silodosin (RAPAFLO) 8 MG CAPS capsule Take 8 mg by mouth every evening.    . simvastatin (ZOCOR) 10 MG tablet Take 10 mg by mouth every evening.  0   No current facility-administered medications for this visit.      Allergies:  Allergies  Allergen Reactions  . Ezetimibe Other (See Comments)  . Levaquin [Levofloxacin In D5w]     Joint pain  . Oxycodone-Acetaminophen     Causes vertigo    Past Medical History, Surgical history, Social history, and Family History were reviewed and updated.    Physical Exam: Blood pressure 109/74, pulse (!) 102, temperature 98 F (36.7 C), temperature source Oral, resp. rate 18, height 5'  10" (1.778 m), weight 151 lb 4.8 oz (68.6 kg), SpO2 99 %.   ECOG: 0 General appearance: Well-appearing gentleman appeared without distress. Head: Atraumatic without abnormalities. Oropharynx: No thrush or ulcers. Eyes: Sclera not injected. Lymph nodes: No lymphadenopathy palpated in the cervical, supraclavicular or axillary regions. Heart: Regular rate and rhythm without any murmurs rubs or gallops. Lung: Clear to auscultation without any rhonchi, wheezes or dullness to percussion. Abdomin: Soft, nontender without any rebound or guarding. Neurological: No motor, sensory  deficits. Skin: No ecchymosis or petechiae noted. Musculoskeletal: No clubbing or cyanosis.  No joint deformities noted.    Lab Results: Lab Results  Component Value Date   WBC 3.1 (L) 07/13/2017   HGB 14.3 05/22/2017   HCT 41.1 07/13/2017   MCV 92.6 07/13/2017   PLT 141 07/13/2017     Chemistry      Component Value Date/Time   NA 139 07/13/2017 0917   K 4.1 07/13/2017 0917   CL 107 07/13/2017 0917   CO2 22 07/13/2017 0917   BUN 21 07/13/2017 0917   CREATININE 1.02 07/13/2017 0917      Component Value Date/Time   CALCIUM 9.4 07/13/2017 0917   ALKPHOS 68 07/13/2017 0917   AST 16 07/13/2017 0917   ALT 15 07/13/2017 0917   BILITOT 0.8 07/13/2017 0917        Impression and Plan:  84 year old man with:   1.    T2 N0 high-grade invasive urothelial carcinoma of the bladder diagnosed in December 2018.    He is currently receiving definitive therapy with radiation and weekly carboplatin.  He has tolerated therapy reasonably well without any complications related to chemotherapy.  Risks and benefits of continuing therapy was reviewed today.  Long-term complication associated with carboplatin include predominantly cytopenias with increased risk of bleeding and infection.  Neuropathy as well as renal insufficiency could also be complications.  After discussion today, is agreeable to continue and anticipate continuing treatment until August 03, 2017.  If his counts continue to be adequate, his last treatment as mentioned will be on April 11.  2.  IV access: No issues related to his IV access at this time.  Peripheral veins will continue being used.  3.  Nausea prophylaxis: Very little to no nausea reported.  Compazine has been helpful.  4.  Kidney function surveillance: His creatinine continues to be within normal range.  5.  Diarrhea: Mostly related to radiation appears to be improving.  Continue to encourage him to use Imodium as needed.  6.  Perirectal irritation: Related  to diarrhea.  The refilled the prescription form to use hydrocortisone with pramoxine to apply topically as needed.  25 minutes was spent with the patient face-to-face today.  More than 50% of time was dedicated to patient counseling, education and coordination of his care.     Zola Button, MD 3/28/20198:50 AM

## 2017-07-20 NOTE — Patient Instructions (Signed)
[facility] Discharge Instructions for Patients Receiving Chemotherapy  Today you received the following chemotherapy agents: Carboplatin (Paraplatin).  To help prevent nausea and vomiting after your treatment, we encourage you to take your nausea medication as prescribed. Received Aloxi during treatment-->take Compazine (not Zofran) for the next 3 days.  If you develop nausea and vomiting that is not controlled by your nausea medication, call the clinic.   BELOW ARE SYMPTOMS THAT SHOULD BE REPORTED IMMEDIATELY:  *FEVER GREATER THAN 100.5 F  *CHILLS WITH OR WITHOUT FEVER  NAUSEA AND VOMITING THAT IS NOT CONTROLLED WITH YOUR NAUSEA MEDICATION  *UNUSUAL SHORTNESS OF BREATH  *UNUSUAL BRUISING OR BLEEDING  TENDERNESS IN MOUTH AND THROAT WITH OR WITHOUT PRESENCE OF ULCERS  *URINARY PROBLEMS  *BOWEL PROBLEMS  UNUSUAL RASH Items with * indicate a potential emergency and should be followed up as soon as possible.  Feel free to call the clinic should you have any questions or concerns. The clinic phone number is [phone].  Please show the [place] at check-in to the Emergency Department and triage nurse.

## 2017-07-21 ENCOUNTER — Ambulatory Visit
Admission: RE | Admit: 2017-07-21 | Discharge: 2017-07-21 | Disposition: A | Discharge status: Home / Self Care | Payer: Medicare Other | Source: Ambulatory Visit | Attending: Radiation Oncology | Admitting: Radiation Oncology

## 2017-07-21 ENCOUNTER — Encounter: Payer: Self-pay | Admitting: *Deleted

## 2017-07-21 ENCOUNTER — Telehealth: Payer: Self-pay | Admitting: *Deleted

## 2017-07-21 ENCOUNTER — Encounter: Payer: Self-pay | Admitting: Oncology

## 2017-07-21 DIAGNOSIS — Z510 Encounter for antineoplastic radiation therapy: Secondary | ICD-10-CM | POA: Diagnosis not present

## 2017-07-21 NOTE — Progress Notes (Signed)
Submitted auth request for Pramoxine today.  Status is pending.

## 2017-07-21 NOTE — Telephone Encounter (Signed)
Faxed last O.V. Note explaining why he had pramoxine cream prescribed. It needed prior auth. Faxed to [facility]  [phone]

## 2017-07-23 ENCOUNTER — Emergency Department (HOSPITAL_COMMUNITY)
Admission: EM | Admit: 2017-07-23 | Discharge: 2017-07-23 | Disposition: A | Discharge status: Home / Self Care | Payer: Medicare Other | Attending: Emergency Medicine | Admitting: Emergency Medicine

## 2017-07-23 ENCOUNTER — Encounter (HOSPITAL_COMMUNITY): Payer: Self-pay | Admitting: Nurse Practitioner

## 2017-07-23 DIAGNOSIS — Z87891 Personal history of nicotine dependence: Secondary | ICD-10-CM | POA: Insufficient documentation

## 2017-07-23 DIAGNOSIS — I10 Essential (primary) hypertension: Secondary | ICD-10-CM | POA: Insufficient documentation

## 2017-07-23 DIAGNOSIS — Z79899 Other long term (current) drug therapy: Secondary | ICD-10-CM | POA: Diagnosis not present

## 2017-07-23 DIAGNOSIS — E785 Hyperlipidemia, unspecified: Secondary | ICD-10-CM | POA: Insufficient documentation

## 2017-07-23 DIAGNOSIS — K6289 Other specified diseases of anus and rectum: Secondary | ICD-10-CM | POA: Diagnosis present

## 2017-07-23 DIAGNOSIS — L309 Dermatitis, unspecified: Secondary | ICD-10-CM | POA: Insufficient documentation

## 2017-07-23 LAB — URINALYSIS, ROUTINE W REFLEX MICROSCOPIC
Bilirubin Urine: NEGATIVE
Glucose, UA: NEGATIVE mg/dL
Ketones, ur: NEGATIVE mg/dL
Leukocytes, UA: NEGATIVE
Nitrite: NEGATIVE
PH: 6 (ref 5.0–8.0)
Protein, ur: 30 mg/dL — AB
SPECIFIC GRAVITY, URINE: 1.008 (ref 1.005–1.030)

## 2017-07-23 LAB — CBC
HEMATOCRIT: 38.6 % — AB (ref 39.0–52.0)
HEMOGLOBIN: 12.7 g/dL — AB (ref 13.0–17.0)
MCH: 30.8 pg (ref 26.0–34.0)
MCHC: 32.9 g/dL (ref 30.0–36.0)
MCV: 93.5 fL (ref 78.0–100.0)
PLATELETS: 245 10*3/uL (ref 150–400)
RBC: 4.13 MIL/uL — AB (ref 4.22–5.81)
RDW: 14.3 % (ref 11.5–15.5)
WBC: 2.5 10*3/uL — AB (ref 4.0–10.5)

## 2017-07-23 LAB — BASIC METABOLIC PANEL
Anion gap: 10 (ref 5–15)
BUN: 21 mg/dL — ABNORMAL HIGH (ref 6–20)
CHLORIDE: 100 mmol/L — AB (ref 101–111)
CO2: 28 mmol/L (ref 22–32)
CREATININE: 0.88 mg/dL (ref 0.61–1.24)
Calcium: 9 mg/dL (ref 8.9–10.3)
GFR calc non Af Amer: >60 mL/min (ref >60)
Glucose, Bld: 109 mg/dL — ABNORMAL HIGH (ref 65–99)
POTASSIUM: 3.8 mmol/L (ref 3.5–5.1)
SODIUM: 138 mmol/L (ref 135–145)

## 2017-07-23 MED ORDER — PRAMOXINE HCL 1 % RE FOAM
Freq: Once | RECTAL | Status: AC
  Administered 2017-07-23: 1 via RECTAL
  Filled 2017-07-23: qty 15

## 2017-07-23 NOTE — ED Notes (Signed)
No answer when called for a room. 

## 2017-07-23 NOTE — Discharge Instructions (Signed)
Apply the pramoxine foam 2-3 times daily.

## 2017-07-23 NOTE — ED Triage Notes (Signed)
Pt is c/o 7/10 rectal pain, onset 3 days ago and has progressively worsened. Also c/o dysuria and frequency albeit a bladder ca pt.

## 2017-07-23 NOTE — ED Provider Notes (Signed)
[facility] DEPT Provider Note   CSN: [id number] Arrival date & time: 07/23/17  1736     History   Chief Complaint Chief Complaint  Patient presents with  . Rectal Pain  . Ca Pt    HPI Douglas Wood is a 84 y.o. male.  Patient presents to the ED with a chief complaint of rectal pain. Patient has long hx of the same intermittently.  States that the symptoms started to come back after having radiation therapy for bladder cancer.  He states that he has been treating the rash with aquaphor and hydrocortisone cream.  He states that this has not been helping and called his doctor, who tried to call him in pramoxine, which has worked in the past.  He states that his insurance wouldn't approve it.  When he called his doctor back, the RN told him to come to the ER to get the medicine.  He denies any other symptoms.  He denies fever.  He states that he has baseline dysuria and frequency, which he says is expected because of his radiation therapy.  The history is provided by the patient. No language interpreter was used.    Past Medical History:  Diagnosis Date  . Arthritis   . Bladder cancer [facility]) urologist-  dr Karsten Ro   dx 04-14-2017 per high grade invasive urothelial carcinoma  . Congenital absence of left kidney   . Dysuria   . GERD (gastroesophageal reflux disease)   . History of basal cell carcinoma excision    hx multiple BCC of skin removals  . History of chronic prostatitis UROLOGIST-  DR Consuella Lose  . History of kidney stones   . History of squamous cell carcinoma excision    multiple SCC of skin removals  . Hyperlipidemia   . Hypertension   . IBS (irritable bowel syndrome)   . IBS (irritable bowel syndrome)   . Skin cancer   . Solitary kidney    LEFT    Patient Active Problem List   Diagnosis Date Noted  . Rectal pain 07/11/2017  . Cancer of lateral wall of urinary bladder ([place]) 05/12/2017  . Lymphadenopathy of left cervical region  05/30/2013  . Left foot pain 10/15/2012  . ABNORMALITY OF GAIT 03/03/2010  . HIP PAIN, RIGHT 02/09/2010  . ITBS, LEFT KNEE 09/02/2009  . OTHER ACQUIRED DEFORMITY OF ANKLE AND FOOT OTHER 09/02/2009    Past Surgical History:  Procedure Laterality Date  . EXCISION BENIGN LYMPH NODE LEFT CERCIAL REGION  09-11-2013    dr toth  [facility]  . HEMORROIDECTOMY  05/30/2005  . RIGHT URETEROSCOPY W/ STONE EXTRACTION AND STENT PLACEMENT  04-04-2008    dr Everlene Balls  [facility]  . TRANSURETHRAL RESECTION OF BLADDER TUMOR N/A 04/14/2017   Procedure: TRANSURETHRAL RESECTION OF BLADDER TUMOR  (TURBT);  Surgeon: Kathie Rhodes, MD;  Location: [facility];  Service: Urology;  Laterality: N/A;  . TRANSURETHRAL RESECTION OF BLADDER TUMOR N/A 05/22/2017   Procedure: TRANSURETHRAL RESECTION OF BLADDER TUMOR (TURBT);  Surgeon: Kathie Rhodes, MD;  Location: [facility];  Service: Urology;  Laterality: N/A;        Home Medications    Prior to Admission medications   Medication Sig Start Date End Date Taking? Authorizing Provider  acetaminophen (TYLENOL) 325 MG tablet Take 650 mg by mouth every 6 (six) hours as needed for mild pain, moderate pain or headache.   Yes [provider]  diazepam (VALIUM) 5 MG tablet Take 5  mg by mouth at bedtime. Takes 2.5 mg in the morning and 5 mg at bedtime as needed.   Yes [provider]  losartan (COZAAR) 50 MG tablet Take 50 mg by mouth every evening.  05/18/17  Yes [provider]  omeprazole (PRILOSEC) 20 MG capsule Take 20 mg by mouth 2 (two) times daily.    Yes [provider]  pramoxine-hydrocortisone (PROCTOCREAM-HC) 1-1 % rectal cream Place 1 application rectally 2 (two) times daily. 07/20/17  Yes Wyatt Portela, MD  prochlorperazine (COMPAZINE) 10 MG tablet Take 10 mg by mouth every 6 (six) hours as needed for nausea or vomiting.   Yes [provider]  silodosin (RAPAFLO) 8 MG CAPS capsule Take 8 mg by mouth  every evening.   Yes [provider]  simvastatin (ZOCOR) 10 MG tablet Take 10 mg by mouth every evening. 07/29/14  Yes [provider]  HYDROCORTISONE ACE, RECTAL, 30 MG SUPP Place 1 suppository (30 mg total) rectally 2 (two) times daily as needed (for rectal pain). 07/11/17 08/10/17  Freeman Caldron, PA-C    Family History Family History  Problem Relation Age of Onset  . Prostate cancer Father   . Bladder Cancer Brother        bladder/dx 30 years ago    Social History Social History   Tobacco Use  . Smoking status: Former Smoker    Packs/day: 1.00    Years: 10.00    Pack years: 10.00    Types: Cigarettes    Last attempt to quit: 04/11/1964    Years since quitting: 53.3  . Smokeless tobacco: Never Used  Substance Use Topics  . Alcohol use: Yes    Alcohol/week: 8.4 oz    Types: 14 Glasses of wine per week    Comment: nightly 1-2 glasses of wine  . Drug use: No     Allergies   Ezetimibe; Levaquin [levofloxacin in d5w]; and Oxycodone-acetaminophen   Review of Systems Review of Systems  All other systems reviewed and are negative.    Physical Exam Updated Vital Signs BP (!) 156/72 (BP Location: Left Arm)   Pulse (!) 55   Temp (!) 97.4 F (36.3 C) (Oral)   Resp 18   Ht 5\' 10"  (1.778 m)   Wt 65.8 kg (145 lb)   SpO2 100%   BMI 20.81 kg/m   Physical Exam  Constitutional: He is oriented to person, place, and time. He appears well-developed and well-nourished.  HENT:  Head: Normocephalic and atraumatic.  Eyes: Pupils are equal, round, and reactive to light. Conjunctivae and EOM are normal. Right eye exhibits no discharge. Left eye exhibits no discharge. No scleral icterus.  Neck: Normal range of motion. Neck supple. No JVD present.  Cardiovascular: Normal rate, regular rhythm and normal heart sounds. Exam reveals no gallop and no friction rub.  No murmur heard. Pulmonary/Chest: Effort normal and breath sounds normal. No respiratory distress. He  has no wheezes. He has no rales. He exhibits no tenderness.  Abdominal: Soft. He exhibits no distension and no mass. There is no tenderness. There is no rebound and no guarding.  Genitourinary:  Genitourinary Comments: Perianal dermatitis R>L, doesn't appear cellulitic, no abscess, no visible hemorrhoid   Musculoskeletal: Normal range of motion. He exhibits no edema or tenderness.  Neurological: He is alert and oriented to person, place, and time.  Skin: Skin is warm and dry.  Psychiatric: He has a normal mood and affect. His behavior is normal. Judgment and thought content normal.  Nursing note and vitals reviewed.    ED Treatments / Results  Labs (all labs ordered are listed, but only abnormal results are displayed) Labs Reviewed  URINALYSIS, ROUTINE W REFLEX MICROSCOPIC - Abnormal; Notable for the following components:      Result Value   Hgb urine dipstick SMALL (*)    Protein, ur 30 (*)    Bacteria, UA RARE (*)    Squamous Epithelial / LPF 0-5 (*)    All other components within normal limits  BASIC METABOLIC PANEL - Abnormal; Notable for the following components:   Chloride 100 (*)    Glucose, Bld 109 (*)    BUN 21 (*)    All other components within normal limits  CBC - Abnormal; Notable for the following components:   WBC 2.5 (*)    RBC 4.13 (*)    Hemoglobin 12.7 (*)    HCT 38.6 (*)    All other components within normal limits  URINE CULTURE    EKG None  Radiology No results found.  Procedures Procedures (including critical care time)  Medications Ordered in ED Medications  pramoxine (PROCTOFOAM) 1 % foam (has no administration in time range)     Initial Impression / Assessment and Plan / ED Course  I have reviewed the triage vital signs and the nursing notes.  Pertinent labs & imaging results that were available during my care of the patient were reviewed by me and considered in my medical decision making (see chart for details).     Patient with  perianal dermatitis.  Has hx of the same.  Had been having a lot of diarrhea recently, but this has resolved in the past several days.  States that he has tried his regular creams, but states that he is here to get pramoxine, which he has used before in the past, but has been unable to get this weekend due to insurance problems.    He has no sign of abscess or cellulitis.  He has not other complaints.  He is well appearing.    10:54 PM Feels improved.  DC to home.  Seen by and discussed with Dr. Darl Householder.  Final Clinical Impressions(s) / ED Diagnoses   Final diagnoses:  Perianal dermatitis    ED Discharge Orders    None       Montine Circle, PA-C 07/23/17 2255    Drenda Freeze, MD 07/23/17 [phone]

## 2017-07-24 ENCOUNTER — Ambulatory Visit
Admission: RE | Admit: 2017-07-24 | Discharge: 2017-07-24 | Disposition: A | Discharge status: Home / Self Care | Payer: Medicare Other | Source: Ambulatory Visit | Attending: Radiation Oncology | Admitting: Radiation Oncology

## 2017-07-24 ENCOUNTER — Telehealth: Payer: Self-pay | Admitting: Radiation Oncology

## 2017-07-24 DIAGNOSIS — Z510 Encounter for antineoplastic radiation therapy: Secondary | ICD-10-CM | POA: Insufficient documentation

## 2017-07-24 DIAGNOSIS — C672 Malignant neoplasm of lateral wall of bladder: Secondary | ICD-10-CM | POA: Insufficient documentation

## 2017-07-24 NOTE — Telephone Encounter (Signed)
Patient reported feeling lightheaded this morning and his bp proved to be low. As requested by Dr. Tammi Klippel I phoned Dr. [name], MD to inquire if Losartan could be held. Per Amy, RN for Dr. Brigitte Pulse Losartan can be held. Phoned patient to inform him of these findings. He reports Dr. Brigitte Pulse already call him. He reports she told him to hold his Losartan for if pressure is below 140/90. Patient confirms he has a way to check his bp at home. Patient expressed appreciation for the call.

## 2017-07-25 ENCOUNTER — Ambulatory Visit
Admission: RE | Admit: 2017-07-25 | Discharge: 2017-07-25 | Disposition: A | Discharge status: Home / Self Care | Payer: Medicare Other | Source: Ambulatory Visit | Attending: Radiation Oncology | Admitting: Radiation Oncology

## 2017-07-25 DIAGNOSIS — Z510 Encounter for antineoplastic radiation therapy: Secondary | ICD-10-CM | POA: Diagnosis not present

## 2017-07-25 LAB — URINE CULTURE: Culture: NO GROWTH

## 2017-07-26 ENCOUNTER — Encounter: Payer: Self-pay | Admitting: Oncology

## 2017-07-26 ENCOUNTER — Ambulatory Visit
Admission: RE | Admit: 2017-07-26 | Discharge: 2017-07-26 | Disposition: A | Discharge status: Home / Self Care | Payer: Medicare Other | Source: Ambulatory Visit | Attending: Radiation Oncology | Admitting: Radiation Oncology

## 2017-07-26 DIAGNOSIS — Z510 Encounter for antineoplastic radiation therapy: Secondary | ICD-10-CM | POA: Diagnosis not present

## 2017-07-26 NOTE — Progress Notes (Signed)
Received call from [facility] appeals department that requested medication has been approved. A copy of approval will be mailed.

## 2017-07-27 ENCOUNTER — Inpatient Hospital Stay: Payer: Medicare Other

## 2017-07-27 ENCOUNTER — Inpatient Hospital Stay: Payer: Medicare Other | Attending: Oncology

## 2017-07-27 ENCOUNTER — Other Ambulatory Visit: Payer: Self-pay | Admitting: Oncology

## 2017-07-27 ENCOUNTER — Ambulatory Visit
Admission: RE | Admit: 2017-07-27 | Discharge: 2017-07-27 | Disposition: A | Discharge status: Home / Self Care | Payer: Medicare Other | Source: Ambulatory Visit | Attending: Radiation Oncology | Admitting: Radiation Oncology

## 2017-07-27 VITALS — BP 124/82 | HR 83 | Temp 98.8°F | Resp 16

## 2017-07-27 DIAGNOSIS — C672 Malignant neoplasm of lateral wall of bladder: Secondary | ICD-10-CM

## 2017-07-27 DIAGNOSIS — Z5111 Encounter for antineoplastic chemotherapy: Secondary | ICD-10-CM | POA: Insufficient documentation

## 2017-07-27 DIAGNOSIS — C7989 Secondary malignant neoplasm of other specified sites: Secondary | ICD-10-CM | POA: Insufficient documentation

## 2017-07-27 DIAGNOSIS — Z510 Encounter for antineoplastic radiation therapy: Secondary | ICD-10-CM | POA: Diagnosis not present

## 2017-07-27 DIAGNOSIS — C679 Malignant neoplasm of bladder, unspecified: Secondary | ICD-10-CM

## 2017-07-27 LAB — CMP (CANCER CENTER ONLY)
ALBUMIN: 2.9 g/dL — AB (ref 3.5–5.0)
ALT: 12 U/L (ref 0–55)
AST: 12 U/L (ref 5–34)
Alkaline Phosphatase: 65 U/L (ref 40–150)
Anion gap: 8 (ref 3–11)
BUN: 13 mg/dL (ref 7–26)
CHLORIDE: 105 mmol/L (ref 98–109)
CO2: 27 mmol/L (ref 22–29)
Calcium: 9 mg/dL (ref 8.4–10.4)
Creatinine: 0.9 mg/dL (ref 0.70–1.30)
GFR, Est AFR Am: >60 mL/min (ref >60)
GFR, Estimated: >60 mL/min (ref >60)
GLUCOSE: 104 mg/dL (ref 70–140)
POTASSIUM: 4 mmol/L (ref 3.5–5.1)
Sodium: 140 mmol/L (ref 136–145)
Total Bilirubin: 0.3 mg/dL (ref 0.2–1.2)
Total Protein: 6.3 g/dL — ABNORMAL LOW (ref 6.4–8.3)

## 2017-07-27 LAB — CBC WITH DIFFERENTIAL (CANCER CENTER ONLY)
BASOS PCT: 0 %
Basophils Absolute: 0 10*3/uL (ref 0.0–0.1)
EOS PCT: 1 %
Eosinophils Absolute: 0 10*3/uL (ref 0.0–0.5)
HCT: 36.1 % — ABNORMAL LOW (ref 38.4–49.9)
Hemoglobin: 12.1 g/dL — ABNORMAL LOW (ref 13.0–17.1)
Lymphocytes Relative: 5 %
Lymphs Abs: 0.2 10*3/uL — ABNORMAL LOW (ref 0.9–3.3)
MCH: 31.1 pg (ref 27.2–33.4)
MCHC: 33.4 g/dL (ref 32.0–36.0)
MCV: 93 fL (ref 79.3–98.0)
MONO ABS: 0.5 10*3/uL (ref 0.1–0.9)
Monocytes Relative: 16 %
NEUTROS ABS: 2.7 10*3/uL (ref 1.5–6.5)
Neutrophils Relative %: 78 %
PLATELETS: 261 10*3/uL (ref 140–400)
RBC: 3.88 MIL/uL — AB (ref 4.20–5.82)
RDW: 14.8 % — AB (ref 11.0–14.6)
WBC: 3.4 10*3/uL — AB (ref 4.0–10.3)

## 2017-07-27 MED ORDER — SODIUM CHLORIDE 0.9 % IV SOLN
Freq: Once | INTRAVENOUS | Status: AC
  Administered 2017-07-27: 10:00:00 via INTRAVENOUS

## 2017-07-27 MED ORDER — DEXAMETHASONE SODIUM PHOSPHATE 10 MG/ML IJ SOLN
10.0000 mg | Freq: Once | INTRAMUSCULAR | Status: AC
  Administered 2017-07-27: 10 mg via INTRAVENOUS

## 2017-07-27 MED ORDER — SODIUM CHLORIDE 0.9 % IV SOLN
158.8000 mg | Freq: Once | INTRAVENOUS | Status: AC
  Administered 2017-07-27: 160 mg via INTRAVENOUS
  Filled 2017-07-27: qty 16

## 2017-07-27 MED ORDER — PALONOSETRON HCL INJECTION 0.25 MG/5ML
0.2500 mg | Freq: Once | INTRAVENOUS | Status: AC
  Administered 2017-07-27: 0.25 mg via INTRAVENOUS

## 2017-07-27 MED ORDER — PALONOSETRON HCL INJECTION 0.25 MG/5ML
INTRAVENOUS | Status: AC
  Filled 2017-07-27: qty 5

## 2017-07-27 MED ORDER — DEXAMETHASONE SODIUM PHOSPHATE 10 MG/ML IJ SOLN
INTRAMUSCULAR | Status: AC
  Filled 2017-07-27: qty 1

## 2017-07-27 NOTE — Patient Instructions (Signed)
[facility] Discharge Instructions for Patients Receiving Chemotherapy  Today you received the following chemotherapy agents: Carboplatin (Paraplatin).  To help prevent nausea and vomiting after your treatment, we encourage you to take your nausea medication as prescribed. Received Aloxi during treatment-->take Compazine (not Zofran) for the next 3 days.  If you develop nausea and vomiting that is not controlled by your nausea medication, call the clinic.   BELOW ARE SYMPTOMS THAT SHOULD BE REPORTED IMMEDIATELY:  *FEVER GREATER THAN 100.5 F  *CHILLS WITH OR WITHOUT FEVER  NAUSEA AND VOMITING THAT IS NOT CONTROLLED WITH YOUR NAUSEA MEDICATION  *UNUSUAL SHORTNESS OF BREATH  *UNUSUAL BRUISING OR BLEEDING  TENDERNESS IN MOUTH AND THROAT WITH OR WITHOUT PRESENCE OF ULCERS  *URINARY PROBLEMS  *BOWEL PROBLEMS  UNUSUAL RASH Items with * indicate a potential emergency and should be followed up as soon as possible.  Feel free to call the clinic should you have any questions or concerns. The clinic phone number is [phone].  Please show the [place] at check-in to the Emergency Department and triage nurse.

## 2017-07-28 ENCOUNTER — Ambulatory Visit
Admission: RE | Admit: 2017-07-28 | Discharge: 2017-07-28 | Disposition: A | Discharge status: Home / Self Care | Payer: Medicare Other | Source: Ambulatory Visit | Attending: Radiation Oncology | Admitting: Radiation Oncology

## 2017-07-28 DIAGNOSIS — Z510 Encounter for antineoplastic radiation therapy: Secondary | ICD-10-CM | POA: Diagnosis not present

## 2017-07-31 ENCOUNTER — Ambulatory Visit
Admission: RE | Admit: 2017-07-31 | Discharge: 2017-07-31 | Disposition: A | Discharge status: Home / Self Care | Payer: Medicare Other | Source: Ambulatory Visit | Attending: Radiation Oncology | Admitting: Radiation Oncology

## 2017-07-31 DIAGNOSIS — Z510 Encounter for antineoplastic radiation therapy: Secondary | ICD-10-CM | POA: Diagnosis not present

## 2017-08-01 ENCOUNTER — Ambulatory Visit
Admission: RE | Admit: 2017-08-01 | Discharge: 2017-08-01 | Disposition: A | Discharge status: Home / Self Care | Payer: Medicare Other | Source: Ambulatory Visit | Attending: Radiation Oncology | Admitting: Radiation Oncology

## 2017-08-01 DIAGNOSIS — Z510 Encounter for antineoplastic radiation therapy: Secondary | ICD-10-CM | POA: Diagnosis not present

## 2017-08-02 ENCOUNTER — Ambulatory Visit: Payer: Medicare Other

## 2017-08-02 ENCOUNTER — Other Ambulatory Visit: Payer: Self-pay | Admitting: Oncology

## 2017-08-02 ENCOUNTER — Ambulatory Visit
Admission: RE | Admit: 2017-08-02 | Discharge: 2017-08-02 | Disposition: A | Discharge status: Home / Self Care | Payer: Medicare Other | Source: Ambulatory Visit | Attending: Radiation Oncology | Admitting: Radiation Oncology

## 2017-08-02 DIAGNOSIS — C672 Malignant neoplasm of lateral wall of bladder: Secondary | ICD-10-CM | POA: Insufficient documentation

## 2017-08-02 DIAGNOSIS — Z510 Encounter for antineoplastic radiation therapy: Secondary | ICD-10-CM | POA: Diagnosis not present

## 2017-08-03 ENCOUNTER — Inpatient Hospital Stay: Payer: Medicare Other

## 2017-08-03 ENCOUNTER — Ambulatory Visit: Payer: Medicare Other

## 2017-08-03 ENCOUNTER — Ambulatory Visit
Admission: RE | Admit: 2017-08-03 | Discharge: 2017-08-03 | Disposition: A | Discharge status: Home / Self Care | Payer: Medicare Other | Source: Ambulatory Visit | Attending: Radiation Oncology | Admitting: Radiation Oncology

## 2017-08-03 VITALS — BP 142/86 | HR 94 | Temp 98.9°F | Resp 16

## 2017-08-03 DIAGNOSIS — Z5111 Encounter for antineoplastic chemotherapy: Secondary | ICD-10-CM | POA: Diagnosis not present

## 2017-08-03 DIAGNOSIS — C679 Malignant neoplasm of bladder, unspecified: Secondary | ICD-10-CM

## 2017-08-03 DIAGNOSIS — C672 Malignant neoplasm of lateral wall of bladder: Secondary | ICD-10-CM

## 2017-08-03 LAB — CMP (CANCER CENTER ONLY)
ALT: 12 U/L (ref 0–55)
ANION GAP: 11 (ref 3–11)
AST: 13 U/L (ref 5–34)
Albumin: 2.8 g/dL — ABNORMAL LOW (ref 3.5–5.0)
Alkaline Phosphatase: 76 U/L (ref 40–150)
BUN: 15 mg/dL (ref 7–26)
CO2: 23 mmol/L (ref 22–29)
Calcium: 9.2 mg/dL (ref 8.4–10.4)
Chloride: 105 mmol/L (ref 98–109)
Creatinine: 0.86 mg/dL (ref 0.70–1.30)
Glucose, Bld: 111 mg/dL (ref 70–140)
POTASSIUM: 4.1 mmol/L (ref 3.5–5.1)
Sodium: 139 mmol/L (ref 136–145)
TOTAL PROTEIN: 6.7 g/dL (ref 6.4–8.3)
Total Bilirubin: 0.3 mg/dL (ref 0.2–1.2)

## 2017-08-03 LAB — CBC WITH DIFFERENTIAL (CANCER CENTER ONLY)
Basophils Absolute: 0 10*3/uL (ref 0.0–0.1)
Basophils Relative: 0 %
EOS PCT: 0 %
Eosinophils Absolute: 0 10*3/uL (ref 0.0–0.5)
HEMATOCRIT: 38.5 % (ref 38.4–49.9)
Hemoglobin: 12.8 g/dL — ABNORMAL LOW (ref 13.0–17.1)
LYMPHS PCT: 8 %
Lymphs Abs: 0.6 10*3/uL — ABNORMAL LOW (ref 0.9–3.3)
MCH: 31 pg (ref 27.2–33.4)
MCHC: 33.2 g/dL (ref 32.0–36.0)
MCV: 93.2 fL (ref 79.3–98.0)
MONO ABS: 0.7 10*3/uL (ref 0.1–0.9)
MONOS PCT: 9 %
NEUTROS ABS: 6.2 10*3/uL (ref 1.5–6.5)
Neutrophils Relative %: 83 %
Platelet Count: 301 10*3/uL (ref 140–400)
RBC: 4.13 MIL/uL — ABNORMAL LOW (ref 4.20–5.82)
RDW: 14.8 % — AB (ref 11.0–14.6)
WBC Count: 7.5 10*3/uL (ref 4.0–10.3)

## 2017-08-03 MED ORDER — DEXAMETHASONE SODIUM PHOSPHATE 10 MG/ML IJ SOLN
10.0000 mg | Freq: Once | INTRAMUSCULAR | Status: AC
  Administered 2017-08-03: 10 mg via INTRAVENOUS

## 2017-08-03 MED ORDER — SODIUM CHLORIDE 0.9 % IV SOLN
158.8000 mg | Freq: Once | INTRAVENOUS | Status: AC
  Administered 2017-08-03: 160 mg via INTRAVENOUS
  Filled 2017-08-03: qty 16

## 2017-08-03 MED ORDER — PALONOSETRON HCL INJECTION 0.25 MG/5ML
INTRAVENOUS | Status: AC
  Filled 2017-08-03: qty 5

## 2017-08-03 MED ORDER — SODIUM CHLORIDE 0.9 % IV SOLN
Freq: Once | INTRAVENOUS | Status: AC
Start: 2017-08-03 — End: 2017-08-03
  Administered 2017-08-03: 10:00:00 via INTRAVENOUS

## 2017-08-03 MED ORDER — PALONOSETRON HCL INJECTION 0.25 MG/5ML
0.2500 mg | Freq: Once | INTRAVENOUS | Status: AC
  Administered 2017-08-03: 0.25 mg via INTRAVENOUS

## 2017-08-03 MED ORDER — DEXAMETHASONE SODIUM PHOSPHATE 10 MG/ML IJ SOLN
INTRAMUSCULAR | Status: AC
  Filled 2017-08-03: qty 1

## 2017-08-03 NOTE — Patient Instructions (Signed)
[facility] Discharge Instructions for Patients Receiving Chemotherapy  Today you received the following chemotherapy agents Carboplatin  To help prevent nausea and vomiting after your treatment, we encourage you to take your nausea medication as directed   If you develop nausea and vomiting that is not controlled by your nausea medication, call the clinic.   BELOW ARE SYMPTOMS THAT SHOULD BE REPORTED IMMEDIATELY:  *FEVER GREATER THAN 100.5 F  *CHILLS WITH OR WITHOUT FEVER  NAUSEA AND VOMITING THAT IS NOT CONTROLLED WITH YOUR NAUSEA MEDICATION  *UNUSUAL SHORTNESS OF BREATH  *UNUSUAL BRUISING OR BLEEDING  TENDERNESS IN MOUTH AND THROAT WITH OR WITHOUT PRESENCE OF ULCERS  *URINARY PROBLEMS  *BOWEL PROBLEMS  UNUSUAL RASH Items with * indicate a potential emergency and should be followed up as soon as possible.  Feel free to call the clinic should you have any questions or concerns. The clinic phone number is [phone].  Please show the CHEMO ALERT CARD at check-in to the Emergency Department and triage nurse.   

## 2017-08-04 ENCOUNTER — Ambulatory Visit
Admission: RE | Admit: 2017-08-04 | Discharge: 2017-08-04 | Disposition: A | Discharge status: Home / Self Care | Payer: Medicare Other | Source: Ambulatory Visit | Attending: Radiation Oncology | Admitting: Radiation Oncology

## 2017-08-04 DIAGNOSIS — Z510 Encounter for antineoplastic radiation therapy: Secondary | ICD-10-CM | POA: Diagnosis not present

## 2017-08-07 ENCOUNTER — Ambulatory Visit
Admission: RE | Admit: 2017-08-07 | Discharge: 2017-08-07 | Disposition: A | Discharge status: Home / Self Care | Payer: Medicare Other | Source: Ambulatory Visit | Attending: Radiation Oncology | Admitting: Radiation Oncology

## 2017-08-07 DIAGNOSIS — Z510 Encounter for antineoplastic radiation therapy: Secondary | ICD-10-CM | POA: Diagnosis not present

## 2017-08-08 ENCOUNTER — Telehealth: Payer: Self-pay | Admitting: Radiation Oncology

## 2017-08-08 ENCOUNTER — Ambulatory Visit
Admission: RE | Admit: 2017-08-08 | Discharge: 2017-08-08 | Disposition: A | Discharge status: Home / Self Care | Payer: Medicare Other | Source: Ambulatory Visit | Attending: Radiation Oncology | Admitting: Radiation Oncology

## 2017-08-08 ENCOUNTER — Other Ambulatory Visit: Payer: Self-pay | Admitting: Urology

## 2017-08-08 DIAGNOSIS — K6289 Other specified diseases of anus and rectum: Secondary | ICD-10-CM

## 2017-08-08 DIAGNOSIS — C672 Malignant neoplasm of lateral wall of bladder: Secondary | ICD-10-CM

## 2017-08-08 DIAGNOSIS — Z510 Encounter for antineoplastic radiation therapy: Secondary | ICD-10-CM | POA: Diagnosis not present

## 2017-08-08 MED ORDER — TRAMADOL HCL 50 MG PO TABS: 50.0000 mg | ORAL_TABLET | Freq: Four times a day (QID) | ORAL | 0 refills | Status: DC | PRN

## 2017-08-08 NOTE — Telephone Encounter (Signed)
Phoned patient as requested by Freeman Caldron, PA-C. Explained that Tramadol refill as been sent to [facility] on [address]. Patient verbalized understanding and expressed appreciation for the help.

## 2017-08-09 ENCOUNTER — Ambulatory Visit
Admission: RE | Admit: 2017-08-09 | Discharge: 2017-08-09 | Disposition: A | Discharge status: Home / Self Care | Payer: Medicare Other | Source: Ambulatory Visit | Attending: Radiation Oncology | Admitting: Radiation Oncology

## 2017-08-09 DIAGNOSIS — Z510 Encounter for antineoplastic radiation therapy: Secondary | ICD-10-CM | POA: Diagnosis not present

## 2017-08-10 ENCOUNTER — Ambulatory Visit
Admission: RE | Admit: 2017-08-10 | Discharge: 2017-08-10 | Disposition: A | Discharge status: Home / Self Care | Payer: Medicare Other | Source: Ambulatory Visit | Attending: Radiation Oncology | Admitting: Radiation Oncology

## 2017-08-10 DIAGNOSIS — Z510 Encounter for antineoplastic radiation therapy: Secondary | ICD-10-CM | POA: Diagnosis not present

## 2017-08-11 ENCOUNTER — Ambulatory Visit
Admission: RE | Admit: 2017-08-11 | Discharge: 2017-08-11 | Disposition: A | Discharge status: Home / Self Care | Payer: Medicare Other | Source: Ambulatory Visit | Attending: Radiation Oncology | Admitting: Radiation Oncology

## 2017-08-11 DIAGNOSIS — Z510 Encounter for antineoplastic radiation therapy: Secondary | ICD-10-CM | POA: Diagnosis not present

## 2017-08-14 ENCOUNTER — Ambulatory Visit
Admission: RE | Admit: 2017-08-14 | Discharge: 2017-08-14 | Disposition: A | Discharge status: Home / Self Care | Payer: Medicare Other | Source: Ambulatory Visit | Attending: Radiation Oncology | Admitting: Radiation Oncology

## 2017-08-14 DIAGNOSIS — Z510 Encounter for antineoplastic radiation therapy: Secondary | ICD-10-CM | POA: Diagnosis not present

## 2017-08-15 ENCOUNTER — Ambulatory Visit
Admission: RE | Admit: 2017-08-15 | Discharge: 2017-08-15 | Disposition: A | Discharge status: Home / Self Care | Payer: Medicare Other | Source: Ambulatory Visit | Attending: Radiation Oncology | Admitting: Radiation Oncology

## 2017-08-15 DIAGNOSIS — Z510 Encounter for antineoplastic radiation therapy: Secondary | ICD-10-CM | POA: Diagnosis not present

## 2017-08-16 ENCOUNTER — Ambulatory Visit
Admission: RE | Admit: 2017-08-16 | Discharge: 2017-08-16 | Disposition: A | Discharge status: Home / Self Care | Payer: Medicare Other | Source: Ambulatory Visit | Attending: Radiation Oncology | Admitting: Radiation Oncology

## 2017-08-16 ENCOUNTER — Ambulatory Visit: Payer: Medicare Other

## 2017-08-16 DIAGNOSIS — C672 Malignant neoplasm of lateral wall of bladder: Secondary | ICD-10-CM | POA: Diagnosis not present

## 2017-08-17 ENCOUNTER — Ambulatory Visit
Admission: RE | Admit: 2017-08-17 | Discharge: 2017-08-17 | Disposition: A | Discharge status: Home / Self Care | Payer: Medicare Other | Source: Ambulatory Visit | Attending: Radiation Oncology | Admitting: Radiation Oncology

## 2017-08-17 ENCOUNTER — Encounter: Payer: Self-pay | Admitting: Radiation Oncology

## 2017-08-17 DIAGNOSIS — C672 Malignant neoplasm of lateral wall of bladder: Secondary | ICD-10-CM | POA: Diagnosis not present

## 2017-08-18 NOTE — Progress Notes (Signed)
  Radiation Oncology         [phone] ________________________________  Name: Douglas Wood MRN: [id number]  Date: 08/17/2017  DOB: [birth year]  End of Treatment Note  Diagnosis:   84 y.o. gentleman with high grade T2N0 urothelial carcinoma of the lateral wall of the bladder  Indication for treatment:  Curative    Radiation treatment dates:   06/28/2017 to 08/17/2017  Site/dose:    1. The bladder and pelvic nodes were treated 45 Gy in 25 fractions of 1.8 Gy. 2. The bladder tumor was boosted 19.8 Gy in 11 fractions of 1.8 Gy.  The bladder was treated to a total dose of 64.8 Gy.  Beams/energy:    1. Static,VMAT // 15X, 6X 2. Static, VMAT // 10X, 15X, 6X  Narrative: The patient tolerated radiation treatment relatively well.   He reported urgency and frequency with urination (especially at night)- managed with Rapaflo in the evening. Also reported hesitancy, weak stream, post-void dribble and mild dysuria at the start and end of stream.  He denies gross hematuria, incomplete emptying or incontinence.  He did experience some looser bowel movements, mild fatigue and loss of appetite, especially in the evening. He denied any skin issues.   Plan: The patient has completed radiation treatment. The patient will return to radiation oncology clinic for routine followup in one month. I advised him to call or return sooner if he has any questions or concerns related to his recovery or treatment. ________________________________  Sheral Apley. Tammi Klippel, M.D.  This document serves as a record of services personally performed by Tyler Pita, MD. It was created on his behalf by Arlyce Harman, a trained medical scribe. The creation of this record is based on the scribe's personal observations and the provider's statements to them. This document has been checked and approved by the attending provider.

## 2017-08-22 ENCOUNTER — Other Ambulatory Visit: Payer: Self-pay | Admitting: Urology

## 2017-08-22 ENCOUNTER — Telehealth: Payer: Self-pay | Admitting: Radiation Oncology

## 2017-08-22 MED ORDER — TRAMADOL HCL 50 MG PO TABS: 50.0000 mg | ORAL_TABLET | Freq: Four times a day (QID) | ORAL | 0 refills | Status: DC | PRN

## 2017-08-22 NOTE — Telephone Encounter (Signed)
As requested by Freeman Caldron, PA-C I phoned the patient and explained his refill of Tramadol has been eprescribed to [facility] on [address]. In addition, I explained that further refills would need to come from his urologist but since tx is done his symptoms should gradually improve reducing the need for this medication. Confirmed one month follow up appointment with the patient. Patient verbalized understanding of all reviewed.

## 2017-08-22 NOTE — Telephone Encounter (Signed)
-----   Message from Freeman Caldron, [place] sent at 08/22/2017 10:04 AM EDT ----- Regarding: RE: Medication refill request Contact: [phone] Please let Mr. Kissick know that I have sent the refill to [facility] on [address].  Since he has now completed radiation treatment, any further refills will need to be granted through his Urology office.  Ideally, now that he has completed tx, his sxs will gradually improve and he will not need to take the medication as frequently so the prescription will last longer.  I will see him back in the office as planned for his 1 month follow up. Thank you!!! -Ashlyn ----- Message ----- From: Heywood Footman, RN Sent: 08/22/2017   9:28 AM To: Freeman Caldron, PA-C Subject: Medication refill request                      Ashlyn.  I received a call from [facility] this morning requesting a refill of his tramadol. The patient completed lateral wall bladder radiation on 08/17/2017. You provided him a script for 30 tablets of tramadol 50 mg on 08/08/2017. He is taking one tablet every six hours. Patient's pharmacy of choice is [facility] on [address].  Sam

## 2017-08-24 ENCOUNTER — Inpatient Hospital Stay: Payer: Medicare Other | Attending: Oncology | Admitting: Oncology

## 2017-08-24 ENCOUNTER — Inpatient Hospital Stay: Payer: Medicare Other

## 2017-08-24 VITALS — BP 129/78 | HR 114 | Temp 98.0°F | Resp 18 | Ht 70.0 in | Wt 144.8 lb

## 2017-08-24 DIAGNOSIS — C679 Malignant neoplasm of bladder, unspecified: Secondary | ICD-10-CM

## 2017-08-24 DIAGNOSIS — R197 Diarrhea, unspecified: Secondary | ICD-10-CM

## 2017-08-24 DIAGNOSIS — Z923 Personal history of irradiation: Secondary | ICD-10-CM | POA: Diagnosis not present

## 2017-08-24 DIAGNOSIS — C672 Malignant neoplasm of lateral wall of bladder: Secondary | ICD-10-CM | POA: Diagnosis present

## 2017-08-24 DIAGNOSIS — Z9221 Personal history of antineoplastic chemotherapy: Secondary | ICD-10-CM | POA: Diagnosis not present

## 2017-08-24 LAB — CBC WITH DIFFERENTIAL (CANCER CENTER ONLY)
BASOS ABS: 0 10*3/uL (ref 0.0–0.1)
BASOS PCT: 0 %
Eosinophils Absolute: 0 10*3/uL (ref 0.0–0.5)
Eosinophils Relative: 0 %
HEMATOCRIT: 35.7 % — AB (ref 38.4–49.9)
Hemoglobin: 11.7 g/dL — ABNORMAL LOW (ref 13.0–17.1)
LYMPHS PCT: 13 %
Lymphs Abs: 0.5 10*3/uL — ABNORMAL LOW (ref 0.9–3.3)
MCH: 31 pg (ref 27.2–33.4)
MCHC: 32.8 g/dL (ref 32.0–36.0)
MCV: 94.7 fL (ref 79.3–98.0)
Monocytes Absolute: 0.3 10*3/uL (ref 0.1–0.9)
Monocytes Relative: 7 %
NEUTROS ABS: 3 10*3/uL (ref 1.5–6.5)
NEUTROS PCT: 80 %
Platelet Count: 179 10*3/uL (ref 140–400)
RBC: 3.77 MIL/uL — AB (ref 4.20–5.82)
RDW: 14.9 % — ABNORMAL HIGH (ref 11.0–14.6)
WBC: 3.8 10*3/uL — AB (ref 4.0–10.3)

## 2017-08-24 LAB — CMP (CANCER CENTER ONLY)
ALBUMIN: 3.1 g/dL — AB (ref 3.5–5.0)
ALK PHOS: 70 U/L (ref 40–150)
ALT: 22 U/L (ref 0–55)
ANION GAP: 10 (ref 3–11)
AST: 23 U/L (ref 5–34)
BILIRUBIN TOTAL: 0.4 mg/dL (ref 0.2–1.2)
BUN: 11 mg/dL (ref 7–26)
CALCIUM: 8.9 mg/dL (ref 8.4–10.4)
CO2: 29 mmol/L (ref 22–29)
Chloride: 100 mmol/L (ref 98–109)
Creatinine: 0.93 mg/dL (ref 0.70–1.30)
Glucose, Bld: 112 mg/dL (ref 70–140)
POTASSIUM: 3.2 mmol/L — AB (ref 3.5–5.1)
Sodium: 139 mmol/L (ref 136–145)
TOTAL PROTEIN: 6.5 g/dL (ref 6.4–8.3)

## 2017-08-24 NOTE — Progress Notes (Signed)
Hematology and Oncology Follow Up Visit  Douglas Wood [id number] [birth year] 84 y.o. 08/24/2017 12:39 PM Douglas Wood, MDShaw, Douglas May, MD   Principle Diagnosis: 84 year old man with high-grade urothelial carcinoma of the bladder diagnosed in December 2018.  He was found to have T2N0 disease at the time.     Prior Therapy:  He S/P TURBT on April 14, 2017 under the care of Dr. Karsten Ro for a 5.5 cm tumor involving the right wall of the bladder.   Definitive therapy with radiation therapy and weekly carboplatin concluded in April 2019.   Current therapy: Active surveillance.  Interim History: [name] is here for a follow-up.  Since last visit, he completed definitive therapy with radiation and weekly carboplatin without any major complications.  He continues to have issues with perirectal pain although improved.  He does have significant fatigue that is improving as well.  He has resumed most activities of daily living but has not been able to play golf yet.  He is able to walk a mile a day at this time.  He denies any late complications related to chemotherapy.  He does not report any headaches, blurry vision, syncope or seizures. Does not report any fevers, chills or sweats.  Does not report any cough, wheezing or hemoptysis.  Does not report any chest pain, palpitation, orthopnea or leg edema.  Does not report any nausea, vomiting or abdominal pain.  She does not report any constipation or diarrhea.  Does not report any skeletal complaints.    Does not report frequency, urgency or hematuria.  Does not report any skin rashes or lesions. Does not report any heat or cold intolerance.  Does not report any lymphadenopathy or petechiae.  Does not report any anxiety or depression.  Remaining review of systems is negative.    Medications: I have reviewed the patient's current medications.  Current Outpatient Medications  Medication Sig Dispense Refill  . acetaminophen (TYLENOL) 325 MG  tablet Take 650 mg by mouth every 6 (six) hours as needed for mild pain, moderate pain or headache.    . diazepam (VALIUM) 5 MG tablet Take 5 mg by mouth at bedtime. Takes 2.5 mg in the morning and 5 mg at bedtime as needed.    Marland Kitchen losartan (COZAAR) 50 MG tablet Take 50 mg by mouth every evening.   0  . omeprazole (PRILOSEC) 20 MG capsule Take 20 mg by mouth 2 (two) times daily.     . pramoxine-hydrocortisone (PROCTOCREAM-HC) 1-1 % rectal cream Place 1 application rectally 2 (two) times daily. 30 g 1  . prochlorperazine (COMPAZINE) 10 MG tablet Take 10 mg by mouth every 6 (six) hours as needed for nausea or vomiting.    . silodosin (RAPAFLO) 8 MG CAPS capsule Take 8 mg by mouth every evening.    . simvastatin (ZOCOR) 10 MG tablet Take 10 mg by mouth every evening.  0  . traMADol (ULTRAM) 50 MG tablet Take 1 tablet (50 mg total) by mouth every 6 (six) hours as needed for severe pain. 60 tablet 0   No current facility-administered medications for this visit.      Allergies:  Allergies  Allergen Reactions  . Ezetimibe Other (See Comments)  . Levaquin [Levofloxacin In D5w]     Joint pain  . Oxycodone-Acetaminophen     Causes vertigo    Past Medical History, Surgical history, Social history, and Family History reviewed and unchanged.    Physical Exam: Blood pressure 129/78, pulse (!) 114, temperature 98  F (36.7 C), temperature source Oral, resp. rate 18, height 5\' 10"  (1.778 m), weight 144 lb 12.8 oz (65.7 kg), SpO2 96 %.    ECOG: 0 General appearance: Alert, awake gentleman without distress. Head: Cephalic without abnormalities. Oropharynx: Mucous membranes are moist and pink. Eyes: Filler equal and round reactive to light. Lymph nodes: No cervical, supraclavicular or axillary lymphadenopathy. Heart: Regular rate without any murmurs.  No edema noted. Lung: Clear without any rhonchi, wheezes or dullness to percussion. Abdomin: Soft, good bowel sounds without any rebound or  guarding. Neurological: No deficits noted in the motor, sensory exam. Skin: No skin rashes or lesions. Musculoskeletal: No joint deformity or effusion.    Lab Results: Lab Results  Component Value Date   WBC 7.5 08/03/2017   HGB 12.8 (L) 08/03/2017   HCT 38.5 08/03/2017   MCV 93.2 08/03/2017   PLT 301 08/03/2017     Chemistry      Component Value Date/Time   NA 139 08/03/2017 0904   K 4.1 08/03/2017 0904   CL 105 08/03/2017 0904   CO2 23 08/03/2017 0904   BUN 15 08/03/2017 0904   CREATININE 0.86 08/03/2017 0904      Component Value Date/Time   CALCIUM 9.2 08/03/2017 0904   ALKPHOS 76 08/03/2017 0904   AST 13 08/03/2017 0904   ALT 12 08/03/2017 0904   BILITOT 0.3 08/03/2017 0904        Impression and Plan:  84 year old man with:   1.    High-grade invasive urothelial carcinoma of the bladder diagnosed in December 2018 with T2N0 disease.    He is status post definitive therapy with carboplatin and radiation concluded in April 2019.  The natural course of this disease was discussed today.  The plan at this point is to continue with active surveillance and repeat imaging studies in 3 months.  He will have routine cystoscopy done by Dr. Karsten Ro in the future.  Further systemic therapy will be needed if he develops advanced disease in the future.  2.  Diarrhea: Related to treatment and appears to have resolved at this time.  3.  Perirectal irritation: Related to his diarrhea and treatment.  Improved slowly and does take tramadol which helped his pain.  4.  Follow-up: We will be in 3 months after repeat imaging studies.  15 minutes was spent with the patient face-to-face today.  More than 50% of time was dedicated to patient counseling, education and coordination of his care.     Zola Button, MD 5/2/201912:39 PM

## 2017-08-25 ENCOUNTER — Telehealth: Payer: Self-pay

## 2017-08-25 NOTE — Telephone Encounter (Signed)
Spoke with patient concerning appointments that was schedule for him per 5/2 los. Also asked patient to come in and pick up contrast that will be needed for his ct

## 2017-08-30 ENCOUNTER — Telehealth: Payer: Self-pay | Admitting: Radiation Oncology

## 2017-08-30 NOTE — Telephone Encounter (Signed)
Patient left voicemail message requesting a return call. Phoned patient to inquire. Patient reports continued diarrhea managed with Imodium and weak stream. Patient confirms he continues to take Ditropan as prescribed. Explained these symptoms are expected just 2 weeks s/p completion of radiation. Encouraged patient to continue Imodium, hydrate and give it a few more weeks for the effects of radiation therapy to resolved. Patient verbalized understanding and expressed appreciation for the return call.

## 2017-09-21 ENCOUNTER — Encounter: Payer: Self-pay | Admitting: Urology

## 2017-09-21 ENCOUNTER — Ambulatory Visit
Admission: RE | Admit: 2017-09-21 | Discharge: 2017-09-21 | Disposition: A | Discharge status: Home / Self Care | Payer: Medicare Other | Source: Ambulatory Visit | Attending: Urology | Admitting: Urology

## 2017-09-21 ENCOUNTER — Other Ambulatory Visit: Payer: Self-pay

## 2017-09-21 VITALS — BP 130/80 | HR 78 | Temp 97.6°F | Resp 20 | Ht 70.0 in | Wt 144.2 lb

## 2017-09-21 DIAGNOSIS — C672 Malignant neoplasm of lateral wall of bladder: Secondary | ICD-10-CM | POA: Diagnosis not present

## 2017-09-21 DIAGNOSIS — Z923 Personal history of irradiation: Secondary | ICD-10-CM | POA: Insufficient documentation

## 2017-09-21 DIAGNOSIS — Z79899 Other long term (current) drug therapy: Secondary | ICD-10-CM | POA: Insufficient documentation

## 2017-09-21 NOTE — Progress Notes (Signed)
Radiation Oncology         [phone] ________________________________  Name: Douglas Wood MRN: [id number]  Date: 09/21/2017  DOB: 08/05/1931  Post Treatment Note  CC: [name], MD  Wyatt Portela, MD  Diagnosis:   84 y.o. gentleman with high grade T2N0 urothelial carcinoma of thelateral wall of thebladder  Interval Since Last Radiation:  4 weeks, concurrent with Carboplatin chemotherapy  06/28/2017 to 08/17/2017:  1. The bladder and pelvic nodes were treated 45 Gy in 25 fractions of 1.8 Gy. 2. The bladder tumor was boosted 19.8 Gy in 11 fractions of 1.8 Gy.  The bladder was treated to a total dose of 64.8 Gy.  Narrative:  He S/P TURBT on April 14, 2017 under the care of Dr. Karsten Ro for a 5.5 cm tumor involving the right wall of the bladder. Definitive therapy with radiation therapy and weekly carboplatin concluded in April 2019.  The patient returns today for routine follow-up.  He tolerated radiation treatment relatively well.   He reported urgency and frequency with urination (especially at night)- managed with Rapaflo in the evening. Also reported hesitancy, weak stream, post-void dribble and mild dysuria at the start and end of stream.  He denied gross hematuria, incomplete emptying or incontinence.  He did experience some looser bowel movements, mild fatigue and loss of appetite, especially in the evening. He denied any skin issues.                            On review of systems, the patient states that he is doing very well overall.  He reports that his urinary and bowel habits have returned to his baseline at this point.  He specifically denies excessive daytime frequency, urgency, dysuria, hematuria, weak stream, incomplete emptying or incontinence.  He was able to sleep throughout the night last night without the need to get up to urinate.  He continues with mild fatigue but feels this is gradually improving.  ALLERGIES:  is allergic to ezetimibe; levaquin  [levofloxacin in d5w]; and oxycodone-acetaminophen.  Meds: Current Outpatient Medications  Medication Sig Dispense Refill  . acetaminophen (TYLENOL) 325 MG tablet Take 650 mg by mouth every 6 (six) hours as needed for mild pain, moderate pain or headache.    . diazepam (VALIUM) 5 MG tablet Take 5 mg by mouth at bedtime. Takes 2.5 mg in the morning and 5 mg at bedtime as needed.    Marland Kitchen omeprazole (PRILOSEC) 20 MG capsule Take 20 mg by mouth 2 (two) times daily.     . silodosin (RAPAFLO) 8 MG CAPS capsule Take 8 mg by mouth every evening.    . simvastatin (ZOCOR) 10 MG tablet Take 10 mg by mouth every evening.  0  . pramoxine-hydrocortisone (PROCTOCREAM-HC) 1-1 % rectal cream Place 1 application rectally 2 (two) times daily. (Patient not taking: Reported on 09/21/2017) 30 g 1  . prochlorperazine (COMPAZINE) 10 MG tablet Take 10 mg by mouth every 6 (six) hours as needed for nausea or vomiting.    . traMADol (ULTRAM) 50 MG tablet Take 1 tablet (50 mg total) by mouth every 6 (six) hours as needed for severe pain. (Patient not taking: Reported on 09/21/2017) 60 tablet 0   No current facility-administered medications for this encounter.     Physical Findings:  height is 5\' 10"  (1.778 m) and weight is 144 lb 3.2 oz (65.4 kg). His oral temperature is 97.6 F (36.4 C). His blood pressure  is 130/80 and his pulse is 78. His respiration is 20 and oxygen saturation is 100%.  Pain Assessment Pain Score: 0-No pain/10 In general this is a well appearing Caucasian male in no acute distress.  He's alert and oriented x4 and appropriate throughout the examination. Cardiopulmonary assessment is negative for acute distress and he exhibits normal effort.   Lab Findings: Lab Results  Component Value Date   WBC 3.8 (L) 08/24/2017   HGB 11.7 (L) 08/24/2017   HCT 35.7 (L) 08/24/2017   MCV 94.7 08/24/2017   PLT 179 08/24/2017     Radiographic Findings: No results found.  Impression/Plan: 1. 84 y.o. gentleman  with high grade T2N0 urothelial carcinoma of thelateral wall of thebladder. He appears to have recovered well from the effects of radiotherapy.  He has completed definitive therapy with carboplatin chemotherapy concurrent with XRT and will continue in routine follow-up with Dr. Alen Blew for further management of his systemic disease with plans for repeat systemic imaging in August 2019. He will continue in follow up with Dr. Karsten Ro as well for surveillance cystoscopies and consideration would be given for further systemic therapy should he demonstrate any disease progression in the future. We discussed that while we are happy to continue to participate in his care if clinically indicated, at this time, we will plan to see him back on an as-needed basis.  He knows to call at anytime with any questions or concerns related to his previous radiotherapy.  He is comfortable with and in agreement with this plan.     Nicholos Johns, PA-C

## 2017-11-16 ENCOUNTER — Inpatient Hospital Stay: Payer: Medicare Other | Attending: Oncology

## 2017-11-16 ENCOUNTER — Encounter (HOSPITAL_COMMUNITY): Payer: Self-pay

## 2017-11-16 ENCOUNTER — Ambulatory Visit (HOSPITAL_COMMUNITY)
Admission: RE | Admit: 2017-11-16 | Discharge: 2017-11-16 | Disposition: A | Discharge status: Home / Self Care | Payer: Medicare Other | Source: Ambulatory Visit | Attending: Oncology | Admitting: Oncology

## 2017-11-16 DIAGNOSIS — IMO0002 Reserved for concepts with insufficient information to code with codable children: Secondary | ICD-10-CM | POA: Insufficient documentation

## 2017-11-16 DIAGNOSIS — C672 Malignant neoplasm of lateral wall of bladder: Secondary | ICD-10-CM | POA: Insufficient documentation

## 2017-11-16 DIAGNOSIS — Q600 Renal agenesis, unilateral: Secondary | ICD-10-CM | POA: Insufficient documentation

## 2017-11-16 LAB — CMP (CANCER CENTER ONLY)
ALBUMIN: 4.3 g/dL (ref 3.5–5.0)
ALT: 16 U/L (ref 0–44)
ANION GAP: 11 (ref 5–15)
AST: 20 U/L (ref 15–41)
Alkaline Phosphatase: 74 U/L (ref 38–126)
BUN: 20 mg/dL (ref 8–23)
CHLORIDE: 104 mmol/L (ref 98–111)
CO2: 28 mmol/L (ref 22–32)
Calcium: 9.8 mg/dL (ref 8.9–10.3)
Creatinine: 1.04 mg/dL (ref 0.61–1.24)
GFR, Estimated: >60 mL/min (ref >60)
GLUCOSE: 95 mg/dL (ref 70–99)
Potassium: 4.5 mmol/L (ref 3.5–5.1)
SODIUM: 143 mmol/L (ref 135–145)
Total Bilirubin: 0.4 mg/dL (ref 0.3–1.2)
Total Protein: 7.6 g/dL (ref 6.5–8.1)

## 2017-11-16 LAB — CBC WITH DIFFERENTIAL (CANCER CENTER ONLY)
Basophils Absolute: 0 10*3/uL (ref 0.0–0.1)
Basophils Relative: 1 %
Eosinophils Absolute: 0.1 10*3/uL (ref 0.0–0.5)
Eosinophils Relative: 2 %
HEMATOCRIT: 39.7 % (ref 38.4–49.9)
HEMOGLOBIN: 13 g/dL (ref 13.0–17.1)
LYMPHS ABS: 0.5 10*3/uL — AB (ref 0.9–3.3)
LYMPHS PCT: 16 %
MCH: 32 pg (ref 27.2–33.4)
MCHC: 32.8 g/dL (ref 32.0–36.0)
MCV: 97.4 fL (ref 79.3–98.0)
MONOS PCT: 18 %
Monocytes Absolute: 0.5 10*3/uL (ref 0.1–0.9)
NEUTROS ABS: 1.9 10*3/uL (ref 1.5–6.5)
NEUTROS PCT: 63 %
Platelet Count: 227 10*3/uL (ref 140–400)
RBC: 4.07 MIL/uL — ABNORMAL LOW (ref 4.20–5.82)
RDW: 14.1 % (ref 11.0–14.6)
WBC Count: 3 10*3/uL — ABNORMAL LOW (ref 4.0–10.3)

## 2017-11-16 MED ORDER — IOPAMIDOL (ISOVUE-300) INJECTION 61%
INTRAVENOUS | Status: AC
  Filled 2017-11-16: qty 100

## 2017-11-16 MED ORDER — SODIUM CHLORIDE 0.9 % IV SOLN
INTRAVENOUS | Status: AC
  Administered 2017-11-16: 250 mL via INTRAVENOUS
  Filled 2017-11-16: qty 250

## 2017-11-16 MED ORDER — SODIUM CHLORIDE 0.9 % IV BOLUS
250.0000 mL | Freq: Once | INTRAVENOUS | Status: AC
  Administered 2017-11-16: 250 mL via INTRAVENOUS

## 2017-11-16 MED ORDER — IOPAMIDOL (ISOVUE-300) INJECTION 61%
100.0000 mL | Freq: Once | INTRAVENOUS | Status: AC | PRN
  Administered 2017-11-16: 100 mL via INTRAVENOUS

## 2017-11-23 ENCOUNTER — Inpatient Hospital Stay: Payer: Medicare Other | Attending: Oncology | Admitting: Oncology

## 2017-11-23 VITALS — BP 150/80 | HR 67 | Temp 98.0°F | Resp 18 | Ht 70.0 in | Wt 154.5 lb

## 2017-11-23 DIAGNOSIS — Z923 Personal history of irradiation: Secondary | ICD-10-CM

## 2017-11-23 DIAGNOSIS — Z9221 Personal history of antineoplastic chemotherapy: Secondary | ICD-10-CM | POA: Insufficient documentation

## 2017-11-23 DIAGNOSIS — C672 Malignant neoplasm of lateral wall of bladder: Secondary | ICD-10-CM | POA: Insufficient documentation

## 2017-11-23 DIAGNOSIS — R197 Diarrhea, unspecified: Secondary | ICD-10-CM | POA: Diagnosis not present

## 2017-11-23 DIAGNOSIS — Z79899 Other long term (current) drug therapy: Secondary | ICD-10-CM | POA: Diagnosis not present

## 2017-11-23 NOTE — Progress Notes (Signed)
Hematology and Oncology Follow Up Visit  Douglas Wood [id number] May 04, 1931 83 y.o. 11/23/2017 3:22 PM Douglas Wood, MDShaw, Douglas May, MD   Principle Diagnosis: 84 year old man with T2N0 high-grade urothelial carcinoma of the bladder diagnosed in December 2018.     Prior Therapy:  He S/P TURBT on April 14, 2017 under the care of Dr. Karsten Ro for a 5.5 cm tumor involving the right wall of the bladder.   Definitive therapy with radiation therapy and weekly carboplatin concluded in April 2019.   Current therapy: Active surveillance.  Interim History: Douglas Wood presents today for a follow-up.  Since her last visit, he reports no major changes in his health.  He remains active and has resumed all activities of daily living.  He is no longer reporting any diarrhea or irritation.  He has gained weight close to 10 pounds since June 2019.  He denies any hematuria or dysuria.  His performance status and activity level is back to normal.  He does not report any headaches, blurry vision, syncope or seizures.  He denies any confusion or alteration in mental status.  He does not report any fevers, chills or sweats.  Does not report any cough, wheezing or hemoptysis.  Does not report any chest pain, palpitation, orthopnea or leg edema.  Does not report any nausea, vomiting or abdominal pain.  She does not report any change in his bowel habits. Does not report any arthralgias or myalgias.    Does not report frequency, urgency or hematuria.  Does not report any skin rashes or lesions. .  Does not report any lymphadenopathy or petechiae.  Does not report any mood changes.  Remaining review of systems is negative.    Medications: I have reviewed the patient's current medications.  Current Outpatient Medications  Medication Sig Dispense Refill  . acetaminophen (TYLENOL) 325 MG tablet Take 650 mg by mouth every 6 (six) hours as needed for mild pain, moderate pain or headache.    . diazepam (VALIUM) 5 MG  tablet Take 5 mg by mouth at bedtime. Takes 2.5 mg in the morning and 5 mg at bedtime as needed.    Marland Kitchen omeprazole (PRILOSEC) 20 MG capsule Take 20 mg by mouth 2 (two) times daily.     . pramoxine-hydrocortisone (PROCTOCREAM-HC) 1-1 % rectal cream Place 1 application rectally 2 (two) times daily. (Patient not taking: Reported on 09/21/2017) 30 g 1  . prochlorperazine (COMPAZINE) 10 MG tablet Take 10 mg by mouth every 6 (six) hours as needed for nausea or vomiting.    . silodosin (RAPAFLO) 8 MG CAPS capsule Take 8 mg by mouth every evening.    . simvastatin (ZOCOR) 10 MG tablet Take 10 mg by mouth every evening.  0  . traMADol (ULTRAM) 50 MG tablet Take 1 tablet (50 mg total) by mouth every 6 (six) hours as needed for severe pain. (Patient not taking: Reported on 09/21/2017) 60 tablet 0   No current facility-administered medications for this visit.      Allergies:  Allergies  Allergen Reactions  . Ezetimibe Other (See Comments)  . Levaquin [Levofloxacin In D5w]     Joint pain  . Oxycodone-Acetaminophen     Causes vertigo    Past Medical History, Surgical history, Social history, and Family History reviewed and unchanged.    Physical Exam: Blood pressure (!) 150/80, pulse 67, temperature 98 F (36.7 C), temperature source Oral, resp. rate 18, height 5\' 10"  (1.778 m), weight 154 lb 8 oz (70.1 kg), SpO2 98 %.  ECOG: 0 General appearance: Well-appearing gentleman without distress. Head: Atraumatic without abnormalities. Oropharynx: No oral thrush or ulcers. Eyes: Pupils are equal and round reactive to light. Lymph nodes: No lymphadenopathy noted in the cervical, supraclavicular or axillary regions. Heart: Regular rate and rhythm without murmurs.  S1-S2 and no leg edema. Lung: Clear all lung fields without any rhonchi, wheezes or dullness to percussion. Abdomin: Soft, nontender, nondistended without any shifting dullness or ascites. Neurological: No motor or sensory deficits. Skin: No  ecchymosis or petechiae. Musculoskeletal: No no joint deformity or effusion.    Lab Results: Lab Results  Component Value Date   WBC 3.0 (L) 11/16/2017   HGB 13.0 11/16/2017   HCT 39.7 11/16/2017   MCV 97.4 11/16/2017   PLT 227 11/16/2017     Chemistry      Component Value Date/Time   NA 143 11/16/2017 1113   K 4.5 11/16/2017 1113   CL 104 11/16/2017 1113   CO2 28 11/16/2017 1113   BUN 20 11/16/2017 1113   CREATININE 1.04 11/16/2017 1113      Component Value Date/Time   CALCIUM 9.8 11/16/2017 1113   ALKPHOS 74 11/16/2017 1113   AST 20 11/16/2017 1113   ALT 16 11/16/2017 1113   BILITOT 0.4 11/16/2017 1113     EXAM: CT CHEST, ABDOMEN, AND PELVIS WITH CONTRAST  TECHNIQUE: Multidetector CT imaging of the chest, abdomen and pelvis was performed following the standard protocol during bolus administration of intravenous contrast.  CONTRAST:  114mL ISOVUE-300 IOPAMIDOL (ISOVUE-300) INJECTION 61%  COMPARISON:  CT abdomen pelvis 03/07/2017  FINDINGS: CT CHEST FINDINGS  Cardiovascular: Heart is mildly enlarged. Coronary arterial vascular calcifications. Thoracic aortic vascular calcifications. Ascending thoracic aorta measures 4.3 cm (image 68; series 7).  Mediastinum/Nodes: No enlarged axillary, mediastinal or hilar lymphadenopathy. Normal appearance of the esophagus.  Lungs/Pleura: Central airways are patent. Dependent atelectasis/scarring within the lower lobes bilaterally. No large area pulmonary consolidation. No pleural effusion or pneumothorax.  Musculoskeletal: Thoracic spine degenerative changes. No aggressive or acute appearing osseous lesions.  CT ABDOMEN PELVIS FINDINGS  Hepatobiliary: Stable subcentimeter low-attenuation lesion hepatic dome (image 10; series 10). Gallbladder is decompressed. No intrahepatic or extrahepatic biliary ductal dilatation.  Pancreas: Unremarkable  Spleen: Unremarkable  Adrenals/Urinary Tract: Normal  adrenal glands. 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 The right kidney enhances appropriately with contrast. Multiple subcentimeter too small to characterize low-attenuation lesions demonstrated within the right kidney. No right-sided hydronephrosis. Delayed images demonstrate excretion of contrast material into the renal collecting system and mid and distal ureter. The proximal ureter is not well opacified. Interval decrease in size of irregular posterior right bladder wall mass now with predominantly irregular thickening along the right aspect of the urinary bladder (image 76; series 19). More inferiorly there is mass effect along the bladder, favored to be secondary to prostate enlargement. Persistent wall thickening of the urinary bladder.  Stomach/Bowel: Oral contrast material to the level of the rectum. No abnormal bowel wall thickening or evidence for bowel obstruction. No evidence for free fluid or free intraperitoneal air. Normal morphology of the stomach.  Vascular/Lymphatic: Normal caliber abdominal aorta. Peripheral calcified atherosclerotic plaque. No retroperitoneal lymphadenopathy.  Reproductive: Enlarged prostate with central dystrophic calcifications.  Other: Small amount of fluid right inguinal canal.  Musculoskeletal: Lumbar spine degenerative changes. No aggressive or acute appearing osseous lesions.  IMPRESSION: 1. Interval decrease in size of lobular mass within the right aspect of the urinary bladder. 2. No evidence for metastatic disease within the chest, abdomen or pelvis. 3.  Ascending thoracic aorta measures 4.3 cm. Recommend annual imaging followup by CTA or MRA. This recommendation follows 2010 ACCF/AHA/AATS/ACR/ASA/SCA/SCAI/SIR/STS/SVM Guidelines for the Diagnosis and Management of Patients with Thoracic Aortic Disease. Circulation. 2010; 121: X412-I786   Impression and Plan:  84 year old man with:   1.    T2N0 high-grade invasive  urothelial carcinoma of the bladder diagnosed in December 2018.  He completed definitive therapy with radiation and weekly carboplatin.  CT scan obtained on 11/16/2017 was reviewed today and discussed with the patient.  He has no evidence to suggest recurrent disease.  The natural course of this disease and future treatment options were reviewed.  I have recommended continued observation and surveillance and use additional therapy only if he develops recurrent disease.  I recommended continue active cystoscopy surveillance as well.  He will schedule an appointment with Dr. Karsten Ro in the near future.  2.  Diarrhea: Resolved at this time and his bowels are regular.  3.  Perirectal irritation: Related to diarrhea from his bladder cancer treatment and has resolved.  4.  Follow-up: We will be in 6 months to repeat imaging studies..  15 minutes was spent with the patient face-to-face today.  More than 50% of time was dedicated to assessing the natural course of this disease, reviewing imaging studies and coordinating future plan.     Zola Button, MD 8/1/20193:22 PM

## 2017-11-24 ENCOUNTER — Telehealth: Payer: Self-pay

## 2017-11-24 NOTE — Telephone Encounter (Signed)
Spoke with patient concerning upcoming appointment. Per 8/1 los. Mail a letter with a calender enclosed

## 2018-05-19 ENCOUNTER — Emergency Department (HOSPITAL_COMMUNITY): Payer: Medicare Other

## 2018-05-19 ENCOUNTER — Encounter (HOSPITAL_COMMUNITY): Payer: Self-pay

## 2018-05-19 ENCOUNTER — Other Ambulatory Visit: Payer: Self-pay

## 2018-05-19 ENCOUNTER — Emergency Department (HOSPITAL_COMMUNITY)
Admission: EM | Admit: 2018-05-19 | Discharge: 2018-05-19 | Disposition: A | Discharge status: Home / Self Care | Payer: Medicare Other | Attending: Emergency Medicine | Admitting: Emergency Medicine

## 2018-05-19 DIAGNOSIS — Y9239 Other specified sports and athletic area as the place of occurrence of the external cause: Secondary | ICD-10-CM | POA: Insufficient documentation

## 2018-05-19 DIAGNOSIS — W1781XA Fall down embankment (hill), initial encounter: Secondary | ICD-10-CM | POA: Insufficient documentation

## 2018-05-19 DIAGNOSIS — Z79899 Other long term (current) drug therapy: Secondary | ICD-10-CM | POA: Insufficient documentation

## 2018-05-19 DIAGNOSIS — Q600 Renal agenesis, unilateral: Secondary | ICD-10-CM | POA: Insufficient documentation

## 2018-05-19 DIAGNOSIS — Z8551 Personal history of malignant neoplasm of bladder: Secondary | ICD-10-CM | POA: Insufficient documentation

## 2018-05-19 DIAGNOSIS — S99912A Unspecified injury of left ankle, initial encounter: Secondary | ICD-10-CM | POA: Diagnosis present

## 2018-05-19 DIAGNOSIS — S82842A Displaced bimalleolar fracture of left lower leg, initial encounter for closed fracture: Secondary | ICD-10-CM

## 2018-05-19 DIAGNOSIS — Z85828 Personal history of other malignant neoplasm of skin: Secondary | ICD-10-CM | POA: Diagnosis not present

## 2018-05-19 DIAGNOSIS — Y9353 Activity, golf: Secondary | ICD-10-CM | POA: Insufficient documentation

## 2018-05-19 DIAGNOSIS — Z87891 Personal history of nicotine dependence: Secondary | ICD-10-CM | POA: Insufficient documentation

## 2018-05-19 DIAGNOSIS — Y999 Unspecified external cause status: Secondary | ICD-10-CM | POA: Diagnosis not present

## 2018-05-19 MED ORDER — IBUPROFEN 200 MG PO TABS
600.0000 mg | ORAL_TABLET | Freq: Once | ORAL | Status: AC
  Administered 2018-05-19: 600 mg via ORAL
  Filled 2018-05-19: qty 3

## 2018-05-19 MED ORDER — ETOMIDATE 2 MG/ML IV SOLN
0.2000 mg/kg | Freq: Once | INTRAVENOUS | Status: AC
  Administered 2018-05-19: 13.6 mg via INTRAVENOUS
  Filled 2018-05-19: qty 10

## 2018-05-19 MED ORDER — HYDROCODONE-ACETAMINOPHEN 5-325 MG PO TABS: 1.0000 | ORAL_TABLET | ORAL | 0 refills | Status: DC | PRN

## 2018-05-19 MED ORDER — MORPHINE SULFATE (PF) 4 MG/ML IV SOLN: 4.0000 mg | Freq: Once | INTRAVENOUS | Status: DC

## 2018-05-19 NOTE — ED Triage Notes (Addendum)
PT BIB EMS FROM A [facility]OLF COURSE C/O LEFT ANKLE PAIN. PT SLIPPED DOWN AN ENBANKMENT ONTO THE LEFT ANKLE. DENIES HEAD INJURY, HEAD PAIN, OR LOC. OBVIOUS SWELLING TO LEFT ANKLE UPON ARRIVAL.

## 2018-05-19 NOTE — ED Provider Notes (Addendum)
[facility] DEPT Provider Note   CSN: [id number] Arrival date & time: 05/19/18  1007     History   Chief Complaint Chief Complaint  Patient presents with  . Ankle Pain    L    HPI Douglas Wood is a 85 y.o. male.  HPI A 85 year old male presents the emergency department left ankle pain and deformity after a fall today while preparing to play golf and walking down a hill to the first T.  He slipped and his left foot ended up underneath him.  He felt severe pain in his left ankle and noted the deformity.  Presents to the emergency department via EMS.  Pain is moderate in severity and worse with movement and palpation.  He does not want any medication for pain at this time.  No other injury.     Past Medical History:  Diagnosis Date  . Arthritis   . Bladder cancer [facility]) urologist-  dr Karsten Ro   dx 04-14-2017 per high grade invasive urothelial carcinoma  . Congenital absence of left kidney   . Dysuria   . GERD (gastroesophageal reflux disease)   . History of basal cell carcinoma excision    hx multiple BCC of skin removals  . History of chronic prostatitis UROLOGIST-  DR Consuella Lose  . History of kidney stones   . History of squamous cell carcinoma excision    multiple SCC of skin removals  . Hyperlipidemia   . Hypertension   . IBS (irritable bowel syndrome)   . IBS (irritable bowel syndrome)   . Skin cancer   . Solitary kidney    LEFT    Patient Active Problem List   Diagnosis Date Noted  . Solitary kidney   . Rectal pain 07/11/2017  . Cancer of lateral wall of urinary bladder ([place]) 05/12/2017  . Lymphadenopathy of left cervical region 05/30/2013  . Left foot pain 10/15/2012  . ABNORMALITY OF GAIT 03/03/2010  . HIP PAIN, RIGHT 02/09/2010  . ITBS, LEFT KNEE 09/02/2009  . OTHER ACQUIRED DEFORMITY OF ANKLE AND FOOT OTHER 09/02/2009    Past Surgical History:  Procedure Laterality Date  . EXCISION BENIGN LYMPH NODE LEFT CERCIAL REGION   09-11-2013    dr toth  [facility]  . HEMORROIDECTOMY  05/30/2005  . RIGHT URETEROSCOPY W/ STONE EXTRACTION AND STENT PLACEMENT  04-04-2008    dr Everlene Balls  [facility]  . TRANSURETHRAL RESECTION OF BLADDER TUMOR N/A 04/14/2017   Procedure: TRANSURETHRAL RESECTION OF BLADDER TUMOR  (TURBT);  Surgeon: Kathie Rhodes, MD;  Location: [facility];  Service: Urology;  Laterality: N/A;  . TRANSURETHRAL RESECTION OF BLADDER TUMOR N/A 05/22/2017   Procedure: TRANSURETHRAL RESECTION OF BLADDER TUMOR (TURBT);  Surgeon: Kathie Rhodes, MD;  Location: [facility];  Service: Urology;  Laterality: N/A;        Home Medications    Prior to Admission medications   Medication Sig Start Date End Date Taking? Authorizing Provider  acetaminophen (TYLENOL) 325 MG tablet Take 650 mg by mouth every 6 (six) hours as needed for mild pain, moderate pain or headache.   Yes [provider]  diazepam (VALIUM) 5 MG tablet Take 5 mg by mouth at bedtime as needed for anxiety.    Yes [provider]  omeprazole (PRILOSEC) 20 MG capsule Take 20 mg by mouth every evening.    Yes [provider]  pramoxine-hydrocortisone (PROCTOCREAM-HC) 1-1 % rectal cream Place 1 application rectally 2 (two) times daily. Patient  taking differently: Place 1 application rectally 2 (two) times daily as needed for hemorrhoids or anal itching.  07/20/17  Yes Wyatt Portela, MD  silodosin (RAPAFLO) 8 MG CAPS capsule Take 8 mg by mouth every evening.   Yes [provider]  simvastatin (ZOCOR) 10 MG tablet Take 10 mg by mouth every evening. 07/29/14  Yes [provider]  traMADol (ULTRAM) 50 MG tablet Take 1 tablet (50 mg total) by mouth every 6 (six) hours as needed for severe pain. 08/22/17  Yes Bruning, Ashlyn, PA-C  HYDROcodone-acetaminophen (NORCO/VICODIN) 5-325 MG tablet Take 1 tablet by mouth every 4 (four) hours as needed for moderate pain. 05/19/18   Jola Schmidt, MD    Family  History Family History  Problem Relation Age of Onset  . Prostate cancer Father   . Bladder Cancer Brother        bladder/dx 30 years ago    Social History Social History   Tobacco Use  . Smoking status: Former Smoker    Packs/day: 1.00    Years: 10.00    Pack years: 10.00    Types: Cigarettes    Last attempt to quit: 04/11/1964    Years since quitting: 54.1  . Smokeless tobacco: Never Used  Substance Use Topics  . Alcohol use: Yes    Alcohol/week: 14.0 standard drinks    Types: 14 Glasses of wine per week    Comment: nightly 1-2 glasses of wine  . Drug use: No     Allergies   Oxycodone-acetaminophen; Levaquin [levofloxacin in d5w]; and Ezetimibe   Review of Systems Review of Systems  All other systems reviewed and are negative.    Physical Exam Updated Vital Signs BP (!) 160/80   Pulse 72   Temp (!) 97.5 F (36.4 C) (Oral)   Resp 17   Ht 5\' 10"  (1.778 m)   Wt 68 kg   SpO2 95%   BMI 21.52 kg/m   Physical Exam Vitals signs and nursing note reviewed.  Constitutional:      Appearance: He is well-developed.  HENT:     Head: Normocephalic.  Neck:     Musculoskeletal: Normal range of motion.  Pulmonary:     Effort: Pulmonary effort is normal.  Abdominal:     General: There is no distension.  Musculoskeletal:     Comments: Deformity of the left ankle consistent with fracture.  This is a closed fracture.  Normal pulses in the left foot.  Full range of motion of left hip and left knee.  Compartments left foot and left lower extremity are soft.  Neurological:     Mental Status: He is alert and oriented to person, place, and time.      ED Treatments / Results  Labs (all labs ordered are listed, but only abnormal results are displayed) Labs Reviewed - No data to display  EKG None  Radiology Dg Ankle Complete Left  Result Date: 05/19/2018 CLINICAL DATA:  Left ankle reduction EXAM: LEFT ANKLE COMPLETE - 3+ VIEW COMPARISON:  Earlier today FINDINGS:  Reduced medial and lateral malleolus fractures. Probable posterior malleolus fracture. Ankle alignment is significantly improved. Generalized swelling. A splint is in place. IMPRESSION: Interval reduction of ankle fractures. Electronically Signed   By: Monte Fantasia M.D.   On: 05/19/2018 13:40   Dg Ankle Complete Left  Result Date: 05/19/2018 CLINICAL DATA:  Fall today, ankle pain. EXAM: LEFT ANKLE COMPLETE - 3+ VIEW COMPARISON:  None. FINDINGS: Displaced fractures of the medial and lateral  malleoli. Approximately 9 mm diastasis at the medial margin of the medial malleolus fracture. More significant displacement of the lateral malleolus fracture with overriding of the main fracture fragments. Associated mild distortion of the ankle mortise with medial widening. Associated soft tissue swelling/edema. Questionable slightly displaced fracture of the posterior malleolus, suspected trimalleolar fracture Visualized portions of the hindfoot and midfoot appear intact and normally aligned. IMPRESSION: Displaced fractures of the medial and lateral malleoli, as detailed above. Additional probable fracture of the posterior malleolus, suspected trimalleolar fracture. Electronically Signed   By: Franki Cabot M.D.   On: 05/19/2018 10:56   Dg Foot Complete Left  Result Date: 05/19/2018 CLINICAL DATA:  Fall, pain. EXAM: LEFT FOOT - COMPLETE 3+ VIEW COMPARISON:  None. FINDINGS: Displaced fracture of the medial and lateral malleoli, as detailed on the ankle report. Osseous structures of the foot appear intact and normally aligned throughout. IMPRESSION: 1. Displaced fractures of the medial and lateral malleoli, as detailed on the ankle report. 2. No osseous fracture or dislocation seen within the foot. Electronically Signed   By: Franki Cabot M.D.   On: 05/19/2018 10:57    Procedures Reduction of fracture Performed by: Jola Schmidt, MD Authorized by: Jola Schmidt, MD  Consent: Verbal consent obtained. Consent  given by: patient Patient understanding: patient states understanding of the procedure being performed Patient identity confirmed: verbally with patient Time out: Immediately prior to procedure a "time out" was called to verify the correct patient, procedure, equipment, support staff and site/side marked as required.  Sedation: Patient sedated: yes (see note)  Patient tolerance: Patient tolerated the procedure well with no immediate complications Comments: Manipulation of left ankle joint.  Tolerated the procedure well.  .Sedation Performed by: Jola Schmidt, MD Authorized by: Jola Schmidt, MD   Consent:    Consent obtained:  Verbal   Consent given by:  Patient   Risks discussed:  Allergic reaction, dysrhythmia, nausea, vomiting, respiratory compromise necessitating ventilatory assistance and intubation and prolonged hypoxia resulting in organ damage Universal protocol:    Immediately prior to procedure a time out was called: yes     Patient identity confirmation method:  Verbally with patient Indications:    Procedure performed:  Fracture reduction Pre-sedation assessment:    Time since last food or drink:  3 hours   ASA classification: class 2 - patient with mild systemic disease     Neck mobility: normal     Mallampati score:  II - soft palate, uvula, fauces visible   Pre-sedation assessments completed and reviewed: airway patency, cardiovascular function, hydration status, mental status and pain level   Immediate pre-procedure details:    Reassessment: Patient reassessed immediately prior to procedure     Reviewed: vital signs and NPO status     Verified: bag valve mask available, emergency equipment available, intubation equipment available, IV patency confirmed and oxygen available   Procedure details (see MAR for exact dosages):    Preoxygenation:  Nasal cannula   Sedation:  Etomidate   Intra-procedure monitoring:  Blood pressure monitoring, cardiac monitor, continuous  capnometry and continuous pulse oximetry   Intra-procedure events: none     Total Provider sedation time (minutes):  15 Post-procedure details:    Attendance: Constant attendance by certified staff until patient recovered     Recovery: Patient returned to pre-procedure baseline     Post-sedation assessments completed and reviewed: airway patency, cardiovascular function, hydration status, mental status, nausea/vomiting and pain level     Patient is stable for discharge or  admission: yes     Patient tolerance:  Tolerated well, no immediate complications .Splint Application Performed by: Jola Schmidt, MD Authorized by: Jola Schmidt, MD     SPLINT APPLICATION Authorized by: Jola Schmidt Consent: Verbal consent obtained. Risks and benefits: risks, benefits and alternatives were discussed Consent given by: patient Splint applied by: orthopedic technician Location details: Left lower extremity Splint type: Short leg with stirrup Supplies used: Ortho-Glass Post-procedure: The splinted body part was neurovascularly unchanged following the procedure. Patient tolerance: Patient tolerated the procedure well with no immediate complications.     Medications Ordered in ED Medications  etomidate (AMIDATE) injection 13.6 mg (13.6 mg Intravenous Given 05/19/18 1150)  ibuprofen (ADVIL,MOTRIN) tablet 600 mg (600 mg Oral Given 05/19/18 1328)     Initial Impression / Assessment and Plan / ED Course  I have reviewed the triage vital signs and the nursing notes.  Pertinent labs & imaging results that were available during my care of the patient were reviewed by me and considered in my medical decision making (see chart for details).     Left ankle by mouth fracture.  Reduced with procedural sedation.  Splint applied.  Orthopedic follow-up.  Crutches.  Pain medication.  Nonweightbearing status.  No other injury.  Final Clinical Impressions(s) / ED Diagnoses   Final diagnoses:  Closed  bimalleolar fracture of left ankle, initial encounter    ED Discharge Orders         Ordered    HYDROcodone-acetaminophen (NORCO/VICODIN) 5-325 MG tablet  Every 4 hours PRN     05/19/18 1409           Jola Schmidt, MD 05/19/18 1415    Jola Schmidt, MD 05/19/18 [phone]

## 2018-05-19 NOTE — ED Notes (Signed)
Bed: DH68 Expected date:  Expected time:  Means of arrival:  Comments: HOLD EMS

## 2018-05-22 ENCOUNTER — Inpatient Hospital Stay: Payer: Medicare Other | Attending: Family Medicine

## 2018-05-22 ENCOUNTER — Encounter (HOSPITAL_COMMUNITY): Payer: Self-pay

## 2018-05-22 ENCOUNTER — Ambulatory Visit (HOSPITAL_COMMUNITY)
Admission: RE | Admit: 2018-05-22 | Discharge: 2018-05-22 | Disposition: A | Discharge status: Home / Self Care | Payer: Medicare Other | Source: Ambulatory Visit | Attending: Oncology | Admitting: Oncology

## 2018-05-22 DIAGNOSIS — C679 Malignant neoplasm of bladder, unspecified: Secondary | ICD-10-CM | POA: Diagnosis present

## 2018-05-22 DIAGNOSIS — C672 Malignant neoplasm of lateral wall of bladder: Secondary | ICD-10-CM | POA: Diagnosis not present

## 2018-05-22 LAB — CMP (CANCER CENTER ONLY)
ALBUMIN: 3.9 g/dL (ref 3.5–5.0)
ALK PHOS: 72 U/L (ref 38–126)
ALT: 16 U/L (ref 0–44)
AST: 19 U/L (ref 15–41)
Anion gap: 11 (ref 5–15)
BUN: 16 mg/dL (ref 8–23)
CALCIUM: 9.5 mg/dL (ref 8.9–10.3)
CO2: 26 mmol/L (ref 22–32)
CREATININE: 1.01 mg/dL (ref 0.61–1.24)
Chloride: 103 mmol/L (ref 98–111)
GFR, Est AFR Am: >60 mL/min (ref >60)
GFR, Estimated: >60 mL/min (ref >60)
GLUCOSE: 107 mg/dL — AB (ref 70–99)
Potassium: 3.8 mmol/L (ref 3.5–5.1)
SODIUM: 140 mmol/L (ref 135–145)
Total Bilirubin: 0.8 mg/dL (ref 0.3–1.2)
Total Protein: 7.5 g/dL (ref 6.5–8.1)

## 2018-05-22 LAB — CBC WITH DIFFERENTIAL (CANCER CENTER ONLY)
ABS IMMATURE GRANULOCYTES: 0.02 10*3/uL (ref 0.00–0.07)
BASOS ABS: 0 10*3/uL (ref 0.0–0.1)
Basophils Relative: 1 %
Eosinophils Absolute: 0.1 10*3/uL (ref 0.0–0.5)
Eosinophils Relative: 3 %
HCT: 38.5 % — ABNORMAL LOW (ref 39.0–52.0)
HEMOGLOBIN: 12.5 g/dL — AB (ref 13.0–17.0)
IMMATURE GRANULOCYTES: 0 %
Lymphocytes Relative: 14 %
Lymphs Abs: 0.7 10*3/uL (ref 0.7–4.0)
MCH: 30.7 pg (ref 26.0–34.0)
MCHC: 32.5 g/dL (ref 30.0–36.0)
MCV: 94.6 fL (ref 80.0–100.0)
MONO ABS: 0.8 10*3/uL (ref 0.1–1.0)
Monocytes Relative: 15 %
NEUTROS PCT: 67 %
NRBC: 0 % (ref 0.0–0.2)
Neutro Abs: 3.4 10*3/uL (ref 1.7–7.7)
Platelet Count: 211 10*3/uL (ref 150–400)
RBC: 4.07 MIL/uL — AB (ref 4.22–5.81)
RDW: 13.2 % (ref 11.5–15.5)
WBC Count: 5 10*3/uL (ref 4.0–10.5)

## 2018-05-22 MED ORDER — SODIUM CHLORIDE 0.9 % IV SOLN
INTRAVENOUS | Status: AC
  Filled 2018-05-22: qty 250

## 2018-05-22 MED ORDER — SODIUM CHLORIDE (PF) 0.9 % IJ SOLN
INTRAMUSCULAR | Status: AC
  Filled 2018-05-22: qty 50

## 2018-05-22 MED ORDER — IOHEXOL 300 MG/ML  SOLN
100.0000 mL | Freq: Once | INTRAMUSCULAR | Status: AC | PRN
  Administered 2018-05-22: 100 mL via INTRAVENOUS

## 2018-05-29 ENCOUNTER — Inpatient Hospital Stay: Payer: Medicare Other | Attending: Oncology | Admitting: Oncology

## 2018-05-29 ENCOUNTER — Inpatient Hospital Stay: Payer: Medicare Other | Admitting: Hematology

## 2018-05-29 VITALS — BP 116/90 | HR 107 | Temp 97.6°F | Resp 18 | Ht 70.0 in

## 2018-05-29 DIAGNOSIS — Z79899 Other long term (current) drug therapy: Secondary | ICD-10-CM | POA: Diagnosis not present

## 2018-05-29 DIAGNOSIS — C679 Malignant neoplasm of bladder, unspecified: Secondary | ICD-10-CM | POA: Diagnosis present

## 2018-05-29 DIAGNOSIS — R197 Diarrhea, unspecified: Secondary | ICD-10-CM

## 2018-05-29 DIAGNOSIS — Z923 Personal history of irradiation: Secondary | ICD-10-CM | POA: Diagnosis not present

## 2018-05-29 DIAGNOSIS — Z9221 Personal history of antineoplastic chemotherapy: Secondary | ICD-10-CM | POA: Diagnosis not present

## 2018-05-29 NOTE — Progress Notes (Signed)
Hematology and Oncology Follow Up Visit  Douglas Wood [id number] [birth year] 85 y.o. 05/29/2018 11:09 AM Douglas Wood, MDShaw, Douglas May, MD   Principle Diagnosis: 85 year old man with bladder cancer diagnosed in 2018.  He was found to have T2N0 high-grade urothelial carcinoma.   Prior Therapy:  He S/P TURBT on April 14, 2017 under the care of Dr. Karsten Ro for a 5.5 cm tumor involving the right wall of the bladder.   Definitive therapy with radiation therapy and weekly carboplatin concluded in April 2019.   Current therapy: Active surveillance.  Interim History: Douglas Wood is here for repeat evaluation.  Since the last visit, he sustained a fall and fractured his left ankle while playing golf over a week now.  He has been evaluated by orthopedic surgery anticipating having surgery in the near future.  He has a soft cast on it but anticipating having stabilizing surgery in the near future.  He is currently utilizing a scooter to eliminate any weightbearing on his left ankle.  He denies any other complaints at this time.  He denies any abdominal pain or early satiety.  He denies any hematuria.   Patient denied any alteration mental status, neuropathy, confusion or dizziness.  Denies any headaches or lethargy.  Denies any night sweats, weight loss or changes in appetite.  Denied orthopnea, dyspnea on exertion or chest discomfort.  Denies shortness of breath, difficulty breathing hemoptysis or cough.  Denies any abdominal distention, nausea, early satiety or dyspepsia.  Denies any hematuria, frequency, dysuria or nocturia.  Denies any skin irritation, dryness or rash.  Denies any ecchymosis or petechiae.  Denies any lymphadenopathy or clotting.  Denies any heat or cold intolerance.  Denies any anxiety or depression.  Remaining review of system is negative.      Medications: I have reviewed the patient's current medications.  Current Outpatient Medications  Medication Sig Dispense Refill  .  acetaminophen (TYLENOL) 325 MG tablet Take 650 mg by mouth every 6 (six) hours as needed for mild pain, moderate pain or headache.    . diazepam (VALIUM) 5 MG tablet Take 5 mg by mouth at bedtime as needed for anxiety.     Marland Kitchen HYDROcodone-acetaminophen (NORCO/VICODIN) 5-325 MG tablet Take 1 tablet by mouth every 4 (four) hours as needed for moderate pain. 15 tablet 0  . omeprazole (PRILOSEC) 20 MG capsule Take 20 mg by mouth every evening.     . pramoxine-hydrocortisone (PROCTOCREAM-HC) 1-1 % rectal cream Place 1 application rectally 2 (two) times daily. (Patient taking differently: Place 1 application rectally 2 (two) times daily as needed for hemorrhoids or anal itching. ) 30 g 1  . silodosin (RAPAFLO) 8 MG CAPS capsule Take 8 mg by mouth every evening.    . simvastatin (ZOCOR) 10 MG tablet Take 10 mg by mouth every evening.  0  . traMADol (ULTRAM) 50 MG tablet Take 1 tablet (50 mg total) by mouth every 6 (six) hours as needed for severe pain. 60 tablet 0   No current facility-administered medications for this visit.      Allergies:  Allergies  Allergen Reactions  . Oxycodone-Acetaminophen Other (See Comments)    Causes vertigo  . Levaquin [Levofloxacin In D5w] Other (See Comments)    Joint pain  . Ezetimibe Other (See Comments)    Muscle aches/pains    Past Medical History, Surgical history, Social history, and Family History reviewed and unchanged.    Physical Exam: Blood pressure 116/90, pulse (!) 107, temperature 97.6 F (36.4 C), temperature  source Oral, resp. rate 18, height 5\' 10"  (1.778 m), SpO2 93 %.    ECOG: 0   General appearance: Comfortable appearing without any discomfort Head: Normocephalic without any trauma Oropharynx: Mucous membranes are moist and pink without any thrush or ulcers. Eyes: Pupils are equal and round reactive to light. Lymph nodes: No cervical, supraclavicular, inguinal or axillary lymphadenopathy.   Heart:regular rate and rhythm.  S1 and S2  without leg edema. Lung: Clear without any rhonchi or wheezes.  No dullness to percussion. Abdomin: Soft, nontender, nondistended with good bowel sounds.  No hepatosplenomegaly. Musculoskeletal: No joint deformity or effusion.  Full range of motion noted. Neurological: No deficits noted on motor, sensory and deep tendon reflex exam. Skin: No petechial rash or dryness.  Appeared moist.      Lab Results: Lab Results  Component Value Date   WBC 5.0 05/22/2018   HGB 12.5 (L) 05/22/2018   HCT 38.5 (L) 05/22/2018   MCV 94.6 05/22/2018   PLT 211 05/22/2018     Chemistry      Component Value Date/Time   NA 140 05/22/2018 0816   K 3.8 05/22/2018 0816   CL 103 05/22/2018 0816   CO2 26 05/22/2018 0816   BUN 16 05/22/2018 0816   CREATININE 1.01 05/22/2018 0816      Component Value Date/Time   CALCIUM 9.5 05/22/2018 0816   ALKPHOS 72 05/22/2018 0816   AST 19 05/22/2018 0816   ALT 16 05/22/2018 0816   BILITOT 0.8 05/22/2018 0816     EXAM: CT CHEST, ABDOMEN, AND PELVIS WITH CONTRAST  TECHNIQUE: Multidetector CT imaging of the chest, abdomen and pelvis was performed following the standard protocol during bolus administration of intravenous contrast.  CONTRAST:  156mL OMNIPAQUE IOHEXOL 300 MG/ML  SOLN  COMPARISON:  11/16/2017  FINDINGS: CT CHEST FINDINGS  Cardiovascular: Heart size appears within normal limits. Aortic atherosclerosis. Stable ascending thoracic aortic aneurysm measuring 4.3 cm. Calcification in the LAD and RCA coronary arteries noted.  Mediastinum/Nodes: Normal appearance of the thyroid gland. The trachea appears patent and is midline. Normal appearance of the esophagus. No supraclavicular, axillary, mediastinal or hilar adenopathy.  Lungs/Pleura: No pleural effusion identified. Mild scarring within the lower lobes. No suspicious pulmonary nodules. Calcified granuloma is again noted in the left lower lobe.  Musculoskeletal: No chest wall mass or  suspicious bone lesions identified.  CT ABDOMEN PELVIS FINDINGS  Hepatobiliary: Unchanged 4 mm low-density structure in the posterior dome, image 46/2. The gallbladder appears normal. No biliary dilatation.  Pancreas: Unremarkable. No pancreatic ductal dilatation or surrounding inflammatory changes.  Spleen: Normal in size without focal abnormality.  Adrenals/Urinary Tract: Normal appearance of the adrenal glands. Status post left nephrectomy. Small low-density cortical lesion in the right kidney is unchanged measuring 8 mm, image 70/2. No right hydronephrosis. Delayed images demonstrate excretion of contrast material into the right renal collecting system and patent right ureter. Irregular posterior right bladder wall mass now with predominantly irregular thickening along the right posterior bladder wall and bladder base, image 58/11. Unchanged from previous exam.  Stomach/Bowel: Stomach normal. No dilated small or large bowel loops. There is mild wall thickening involving the terminal wall thickening involving right lower quadrant small bowel loops and cecum is again noted which Wood reflect sequelae of radiation enteritis. No discrete mass. No pathologic dilatation of the colon.  Vascular/Lymphatic: Aortic atherosclerosis. No aneurysm. No enlarged lymph nodes identified within the abdomen or pelvis. No inguinal adenopathy.  Reproductive: Prostate gland appears enlarged with mass effect  upon bladder base. Unchanged appearance of asymmetric right seminal vesicle enlargement, image 114/2.  Other: Right inguinal hernia contains a small volume of fluid, similar to previous study.  Musculoskeletal: No acute or significant osseous findings.  IMPRESSION: 1. Stable exam. Unchanged appearance of irregular wall thickening involving the right bladder wall in region of previously noted lobular mass. 2. No findings identified to suggest metastatic disease to the chest,  abdomen or pelvis. 3. Stable ascending thoracic aortic aneurysm at 4.3 cm. Recommend annual imaging followup by CTA or MRA. This recommendation follows 2010 ACCF/AHA/AATS/ACR/ASA/SCA/SCAI/SIR/STS/SVM Guidelines for the Diagnosis and Management of Patients with Thoracic Aortic Disease. Circulation. 2010; 121: E703-J009. Aortic aneurysm NOS (ICD10-I71.9) 4. Aortic atherosclerosis and coronary artery atherosclerotic calcifications. Aortic Atherosclerosis (ICD10-I70.0).     Impression and Plan:  85 year old man with:   1.    Bladder cancer diagnosed in December 2018.  He was found to have T2N0 disease.  He completed definitive therapy with radiation and chemotherapy without any evidence of recurrent disease.  The natural course of this disease was reviewed today and CT scan obtained on May 22, 2018 was personally discussed and reviewed.  Continues to have no evidence of recurrent disease and I recommended continued active surveillance.  Plan is to repeat imaging studies in 6 months.  He will have repeat cystoscopy next month by Dr. Karsten Ro.   2.  Diarrhea: Improved at this time.  He does use Imodium periodically.  3.  Left ankle fracture: Follows with orthopedic surgery and anticipated surgery in the near future.  4.  Follow-up: We will be in 6 months with a CT scan..  15 minutes was spent with the patient face-to-face today.  More than 50% of time was dedicated to discussing his disease status, reviewing laboratory data and answering questions regarding plan of care.Zola Button, MD 2/4/202011:09 AM

## 2018-11-02 ENCOUNTER — Other Ambulatory Visit: Payer: Self-pay | Admitting: Family Medicine

## 2018-11-02 DIAGNOSIS — S82842A Displaced bimalleolar fracture of left lower leg, initial encounter for closed fracture: Secondary | ICD-10-CM

## 2018-11-23 ENCOUNTER — Other Ambulatory Visit: Payer: Self-pay | Admitting: Urology

## 2018-11-27 ENCOUNTER — Other Ambulatory Visit: Payer: Self-pay

## 2018-11-27 ENCOUNTER — Ambulatory Visit (HOSPITAL_COMMUNITY)
Admission: RE | Admit: 2018-11-27 | Discharge: 2018-11-27 | Disposition: A | Discharge status: Home / Self Care | Payer: Medicare Other | Source: Ambulatory Visit | Attending: Oncology | Admitting: Oncology

## 2018-11-27 ENCOUNTER — Inpatient Hospital Stay: Payer: Medicare Other | Attending: Oncology

## 2018-11-27 ENCOUNTER — Encounter (HOSPITAL_COMMUNITY): Payer: Self-pay

## 2018-11-27 DIAGNOSIS — Z9221 Personal history of antineoplastic chemotherapy: Secondary | ICD-10-CM | POA: Diagnosis not present

## 2018-11-27 DIAGNOSIS — Z8551 Personal history of malignant neoplasm of bladder: Secondary | ICD-10-CM | POA: Insufficient documentation

## 2018-11-27 DIAGNOSIS — Z923 Personal history of irradiation: Secondary | ICD-10-CM | POA: Insufficient documentation

## 2018-11-27 DIAGNOSIS — C679 Malignant neoplasm of bladder, unspecified: Secondary | ICD-10-CM | POA: Insufficient documentation

## 2018-11-27 DIAGNOSIS — Z79899 Other long term (current) drug therapy: Secondary | ICD-10-CM | POA: Diagnosis not present

## 2018-11-27 DIAGNOSIS — D72819 Decreased white blood cell count, unspecified: Secondary | ICD-10-CM | POA: Insufficient documentation

## 2018-11-27 DIAGNOSIS — Z905 Acquired absence of kidney: Secondary | ICD-10-CM | POA: Diagnosis not present

## 2018-11-27 DIAGNOSIS — N400 Enlarged prostate without lower urinary tract symptoms: Secondary | ICD-10-CM | POA: Diagnosis not present

## 2018-11-27 LAB — CMP (CANCER CENTER ONLY)
ALT: 17 U/L (ref 0–44)
AST: 18 U/L (ref 15–41)
Albumin: 4.3 g/dL (ref 3.5–5.0)
Alkaline Phosphatase: 71 U/L (ref 38–126)
Anion gap: 10 (ref 5–15)
BUN: 19 mg/dL (ref 8–23)
CO2: 26 mmol/L (ref 22–32)
Calcium: 9.7 mg/dL (ref 8.9–10.3)
Chloride: 104 mmol/L (ref 98–111)
Creatinine: 0.97 mg/dL (ref 0.61–1.24)
GFR, Est AFR Am: >60 mL/min (ref >60)
GFR, Estimated: >60 mL/min (ref >60)
Glucose, Bld: 87 mg/dL (ref 70–99)
Potassium: 4.3 mmol/L (ref 3.5–5.1)
Sodium: 140 mmol/L (ref 135–145)
Total Bilirubin: 0.4 mg/dL (ref 0.3–1.2)
Total Protein: 7.5 g/dL (ref 6.5–8.1)

## 2018-11-27 LAB — CBC WITH DIFFERENTIAL (CANCER CENTER ONLY)
Abs Immature Granulocytes: 0.01 10*3/uL (ref 0.00–0.07)
Basophils Absolute: 0 10*3/uL (ref 0.0–0.1)
Basophils Relative: 1 %
Eosinophils Absolute: 0.1 10*3/uL (ref 0.0–0.5)
Eosinophils Relative: 2 %
HCT: 41.2 % (ref 39.0–52.0)
Hemoglobin: 13.4 g/dL (ref 13.0–17.0)
Immature Granulocytes: 0 %
Lymphocytes Relative: 24 %
Lymphs Abs: 0.9 10*3/uL (ref 0.7–4.0)
MCH: 30.8 pg (ref 26.0–34.0)
MCHC: 32.5 g/dL (ref 30.0–36.0)
MCV: 94.7 fL (ref 80.0–100.0)
Monocytes Absolute: 0.6 10*3/uL (ref 0.1–1.0)
Monocytes Relative: 15 %
Neutro Abs: 2.3 10*3/uL (ref 1.7–7.7)
Neutrophils Relative %: 58 %
Platelet Count: 235 10*3/uL (ref 150–400)
RBC: 4.35 MIL/uL (ref 4.22–5.81)
RDW: 13.5 % (ref 11.5–15.5)
WBC Count: 3.9 10*3/uL — ABNORMAL LOW (ref 4.0–10.5)
nRBC: 0 % (ref 0.0–0.2)

## 2018-11-27 MED ORDER — IOHEXOL 300 MG/ML  SOLN
100.0000 mL | Freq: Once | INTRAMUSCULAR | Status: AC | PRN
  Administered 2018-11-27: 100 mL via INTRAVENOUS

## 2018-11-27 MED ORDER — SODIUM CHLORIDE 0.9 % IV SOLN
INTRAVENOUS | Status: AC
  Filled 2018-11-27: qty 250

## 2018-11-27 MED ORDER — SODIUM CHLORIDE (PF) 0.9 % IJ SOLN
INTRAMUSCULAR | Status: AC
  Filled 2018-11-27: qty 50

## 2018-11-29 ENCOUNTER — Inpatient Hospital Stay (HOSPITAL_BASED_OUTPATIENT_CLINIC_OR_DEPARTMENT_OTHER): Payer: Medicare Other | Admitting: Oncology

## 2018-11-29 ENCOUNTER — Other Ambulatory Visit: Payer: Self-pay

## 2018-11-29 VITALS — BP 131/77 | HR 81 | Temp 98.3°F | Resp 16 | Ht 70.0 in | Wt 161.4 lb

## 2018-11-29 DIAGNOSIS — C679 Malignant neoplasm of bladder, unspecified: Secondary | ICD-10-CM

## 2018-11-29 DIAGNOSIS — Z8551 Personal history of malignant neoplasm of bladder: Secondary | ICD-10-CM | POA: Diagnosis not present

## 2018-11-29 NOTE — Progress Notes (Signed)
Hematology and Oncology Follow Up Visit  Douglas Wood [id number] 10-17-1931 85 y.o. 11/29/2018 1:37 PM Douglas Wood, MDShaw, Douglas May, MD   Principle Diagnosis: 85 year old man with T2 N0 high-grade urothelial carcinoma of the bladder diagnosed in 2018.   carcinoma.   Prior Therapy:  He S/P TURBT on April 14, 2017 under the care of Douglas Wood for a 5.5 cm tumor involving the right wall of the bladder.   Definitive therapy with radiation therapy and weekly carboplatin concluded in April 2019.   Current therapy: Active surveillance.  Interim History: Douglas Wood is here for repeat evaluation.  Since her last visit, he reports no major changes in his health.  He denies any increased urinary frequency or hematuria.  He denies any dysuria.  He did have follow-up small residual tumor on cystoscopy and scheduled to have TURBT under the care of Douglas Wood on 12/07/2018.  He denies any residual complications related to chemotherapy without any excessive fatigue or tiredness.  He is ambulating regularly and walks his dogs multiple miles a day.   He denied headaches, blurry vision, syncope or seizures.  Denies any fevers, chills or sweats.  Denied chest pain, palpitation, orthopnea or leg edema.  Denied cough, wheezing or hemoptysis.  Denied nausea, vomiting or abdominal pain.  Denies any constipation or diarrhea.  Denies any frequency urgency or hesitancy.  Denies any arthralgias or myalgias.  Denies any skin rashes or lesions.  Denies any bleeding or clotting tendency.  Denies any easy bruising.  Denies any hair or nail changes.  Denies any anxiety or depression.  Remaining review of system is negative.        Medications: No changes on review today. Current Outpatient Medications  Medication Sig Dispense Refill  . acetaminophen (TYLENOL) 325 MG tablet Take 650 mg by mouth every 6 (six) hours as needed for mild pain, moderate pain or headache.    . diazepam (VALIUM) 5 MG tablet Take 5 mg  by mouth at bedtime as needed for anxiety.     Marland Kitchen HYDROcodone-acetaminophen (NORCO/VICODIN) 5-325 MG tablet Take 1 tablet by mouth every 4 (four) hours as needed for moderate pain. 15 tablet 0  . omeprazole (PRILOSEC) 20 MG capsule Take 20 mg by mouth every evening.     . pramoxine-hydrocortisone (PROCTOCREAM-HC) 1-1 % rectal cream Place 1 application rectally 2 (two) times daily. (Patient taking differently: Place 1 application rectally 2 (two) times daily as needed for hemorrhoids or anal itching. ) 30 g 1  . silodosin (RAPAFLO) 8 MG CAPS capsule Take 8 mg by mouth every evening.    . simvastatin (ZOCOR) 10 MG tablet Take 10 mg by mouth every evening.  0  . traMADol (ULTRAM) 50 MG tablet Take 1 tablet (50 mg total) by mouth every 6 (six) hours as needed for severe pain. 60 tablet 0   No current facility-administered medications for this visit.      Allergies:  Allergies  Allergen Reactions  . Oxycodone-Acetaminophen Other (See Comments)    Causes vertigo  . Levaquin [Levofloxacin In D5w] Other (See Comments)    Joint pain  . Ezetimibe Other (See Comments)    Muscle aches/pains    Past Medical History, Surgical history, Social history, and Family History updated today without changes.    Physical Exam:  Blood pressure 131/77, pulse 81, temperature 98.3 F (36.8 C), temperature source Temporal, resp. rate 16, height 5\' 10"  (1.778 m), weight 161 lb 6 oz (73.2 kg), SpO2 99 %.  ECOG: 0    General appearance: Alert, awake without any distress. Head: Atraumatic without abnormalities Oropharynx: Without any thrush or ulcers. Eyes: No scleral icterus. Lymph nodes: No lymphadenopathy noted in the cervical, supraclavicular, or axillary nodes Heart:regular rate and rhythm, without any murmurs or gallops.   Lung: Clear to auscultation without any rhonchi, wheezes or dullness to percussion. Abdomin: Soft, nontender without any shifting dullness or ascites. Musculoskeletal: No  clubbing or cyanosis. Neurological: No motor or sensory deficits. Skin: No rashes or lesions. Psychiatric: Mood and affect appeared normal.       Lab Results: Lab Results  Component Value Date   WBC 3.9 (L) 11/27/2018   HGB 13.4 11/27/2018   HCT 41.2 11/27/2018   MCV 94.7 11/27/2018   PLT 235 11/27/2018     Chemistry      Component Value Date/Time   NA 140 11/27/2018 1207   K 4.3 11/27/2018 1207   CL 104 11/27/2018 1207   CO2 26 11/27/2018 1207   BUN 19 11/27/2018 1207   CREATININE 0.97 11/27/2018 1207      Component Value Date/Time   CALCIUM 9.7 11/27/2018 1207   ALKPHOS 71 11/27/2018 1207   AST 18 11/27/2018 1207   ALT 17 11/27/2018 1207   BILITOT 0.4 11/27/2018 1207     EXAM: CT CHEST, ABDOMEN, AND PELVIS WITH CONTRAST  TECHNIQUE: Multidetector CT imaging of the chest, abdomen and pelvis was performed following the standard protocol during bolus administration of intravenous contrast.  CONTRAST:  137mL OMNIPAQUE IOHEXOL 300 MG/ML  SOLN  COMPARISON:  05/22/2018  FINDINGS: CT CHEST FINDINGS  Cardiovascular: Mild ascending aortic dilatation, similar at 4.3 cm on image 29/2. Aortic atherosclerosis. Tortuous thoracic aorta. Borderline cardiomegaly, without pericardial effusion. Multivessel coronary artery atherosclerosis. No central pulmonary embolism, on this non-dedicated study.  Mediastinum/Nodes: No supraclavicular adenopathy. No mediastinal or hilar adenopathy.  Lungs/Pleura: No pleural fluid.  Mild centrilobular emphysema.  No suspicious pulmonary nodule or mass.  Left lower lobe scarring.  Musculoskeletal: No acute osseous abnormality.  CT ABDOMEN PELVIS FINDINGS  Hepatobiliary: Normal liver. Normal gallbladder, without biliary ductal dilatation.  Pancreas: Normal, without mass or ductal dilatation.  Spleen: Normal in size, without focal abnormality.  Adrenals/Urinary Tract: Normal adrenal glands. Too small  to characterize right renal lesions. Left nephrectomy, without locally recurrent disease. No right renal collecting system or ureteric filling defect on delayed images.  Diffuse mild bladder wall thickening is similar. Posterior bladder wall somewhat more focal wall thickening and nodularity suggested on sagittal image 64, new. Also coronal image 60.  Stomach/Bowel: Proximal gastric underdistention. Normal colon and terminal ileum. Normal small bowel.  Vascular/Lymphatic: Aortic and branch vessel atherosclerosis. No abdominopelvic adenopathy.  Reproductive: Fluid density in the right inguinal canal is unchanged.  Pelvic anatomy is again distorted. The prostate is enlarged and positioned eccentric right, impressing into the urinary bladder. There is asymmetric enlargement of the right seminal vesicle, including on image 115/2. This is felt to be similar to 05/22/2018.  Other: No significant free fluid.  Musculoskeletal: Osteopenia. Degenerative partial fusion of the left sacroiliac joint.  IMPRESSION: 1. Diffuse mild bladder wall thickening, likely treatment related. Somewhat more focal wall thickening and nodularity within the posterior bladder dome for which residual or metachronous carcinoma cannot be excluded. Correlate with cystoscopy, if not recently performed. 2. Otherwise, similar appearance of the pelvis with asymmetric enlargement of the right seminal vesicle and mild prostatomegaly, positioned eccentric right and impressing into the urinary bladder. This is all likely treatment related.  No evidence of progressive disease in this area. 3. No evidence of metastatic disease in the chest or abdomen, status post left nephrectomy. 4. Aortic atherosclerosis (ICD10-I70.0), coronary artery atherosclerosis and emphysema (ICD10-J43.9). 5. Mild ascending aortic dilatation, similar. Recommend annual imaging followup by CTA or MRA. This recommendation follows  2010 ACCF/AHA/AATS/ACR/ASA/SCA/SCAI/SIR/STS/SVM Guidelines for the Diagnosis and Management of Patients with Thoracic Aortic Disease. Circulation. 2010; 121: I786-V672. Aortic aneurysm NOS (ICD10-I71.9) 6. Similar fluid density in the right inguinal canal could represent small volume fluid or inguinal position of the right   Impression and Plan:  85 year old man with:   1.    T2 N0 bladder cancer diagnosed in December 2018.  He is status post definitive therapy concluded in 2019 without any evidence of relapsed disease.  CT scan and laboratory testing obtained on 11/27/2018 was personally reviewed and discussed with him.  He continues to have no disease outside of the bladder at this time without any evidence of relapse.  The natural course of this disease was updated as well as potential treatment option if he develops relapse.  Immunotherapy Wood be an option if he develops refractory non-muscle invasive disease or if he develops metastatic disease.  At this time I recommended continued active surveillance and repeat imaging studies in 6 months.  He will continue to have periodic cystoscopies under the care of Douglas Wood.  His next one scheduled in the next few weeks.  2.  Leukocytopenia: Mild likely reactive in nature.  We will continue to monitor.  3.  Anemia: Related to malignancy and previous treatment.  His hemoglobin has normalized at this time.  4.  Follow-up: In 6 months for repeat evaluation after repeat imaging studies.  15 minutes was spent with the patient face-to-face today.  More than 50% of time was spent on reviewing his disease status, reviewing imaging studies and future treatment options.     Zola Button, MD 8/6/20201:37 PM

## 2018-12-04 ENCOUNTER — Other Ambulatory Visit (HOSPITAL_COMMUNITY)
Admission: RE | Admit: 2018-12-04 | Discharge: 2018-12-04 | Disposition: A | Discharge status: Home / Self Care | Payer: Medicare Other | Source: Ambulatory Visit | Attending: Urology | Admitting: Urology

## 2018-12-04 ENCOUNTER — Encounter (HOSPITAL_BASED_OUTPATIENT_CLINIC_OR_DEPARTMENT_OTHER): Payer: Self-pay | Admitting: *Deleted

## 2018-12-04 ENCOUNTER — Other Ambulatory Visit: Payer: Self-pay

## 2018-12-04 DIAGNOSIS — Z20828 Contact with and (suspected) exposure to other viral communicable diseases: Secondary | ICD-10-CM | POA: Insufficient documentation

## 2018-12-04 DIAGNOSIS — Z01812 Encounter for preprocedural laboratory examination: Secondary | ICD-10-CM | POA: Insufficient documentation

## 2018-12-04 LAB — SARS CORONAVIRUS 2 (TAT 6-24 HRS): SARS Coronavirus 2: NEGATIVE

## 2018-12-04 NOTE — Progress Notes (Addendum)
Spoke w/ pt via phone for pre-op interview.  Npo after mn.  Arrive at 0800.  Current CBCdiff and cmp results dated 11-27-2018 in epic and chart.  Pt had covid test done today.

## 2018-12-06 NOTE — H&P (Signed)
HPI: Douglas Wood is a 85 year-old male with recurrent bladder cancer.  His bladder cancer was not superficial and limitied to the bladder lining. His bladder cancer was muscle invasive.   He did have a TURBT. His last bladder tumor was resected 05/22/2017. He has had the following number of bladder resections: 2. He did have radiation to treat his bladder cancer. His radiation treatment was complete 08/17/2017.   He has not had blood in his urine recently. The patient has developed urgency. He is not having new bone pain. He has not recently had unwanted weight loss.   His last cysto was 06/27/2018. His last radiologic test to evaluate the kidneys was 04/26/2018.   This condition would be considered of mild to moderate severity with no modifying factors or associated signs or symptoms other than as noted above.   06/27/18: He returns today for routine surveillance cystoscopy after having undergone chemo radiation for high-grade, muscle invasive bladder cancer which was completed in 4/19.  He reports he has developed some dysuria. He is also having increased urgency and frequency but he said he still only gets up once at night. He wanted to be sure he did not have a bacterial cystitis. He denies any hematuria. He just had a recent CT scan which revealed no evidence of extravesical disease.   11/15/18: He came in today because he experienced an episode of gross hematuria that lasted for about 48 hours and was associated with some passage of clots but did not result in obstruction. He has subsequently had clearing of his urine and is not on any blood thinning medication. He denies any dysuria.     ALLERGIES: Levaquin TABS    MEDICATIONS: Rapaflo 8 mg capsule take 1 capsule by mouth at bedtime  Silodosin 8 mg capsule TAKE 1 CAPSULE BY MOUTH AT BEDTIME  Simvastatin 20 mg tablet  Diazepam PRN  Losartan Potassium 25 mg tablet     GU PSH: Cystoscopy - 06/27/2018, 12/27/2017, 03/08/2017 Cystoscopy Insert  Stent - 2009 Cystoscopy TURBT >5 cm - 04/14/2017 Cystoscopy TURBT 2-5 cm - 05/22/2017 Locm 300-399Mg /Ml Iodine,1Ml - 03/07/2017 Ureteroscopic stone removal - 2009       PSH Notes: Cystoscopy With Ureteroscopy With Removal Of Calculus, Cystoscopy With Insertion Of Ureteral Stent Right, Dental Surgery, Hemorrhoidectomy   NON-GU PSH: Hemorrhoidectomy (favorite) - 2008     GU PMH: Gross hematuria - 11/07/2018, (Stable), His gross hematuria is secondary to the large bladder tumor identified within his bladder today., - 03/08/2017, - 03/02/2017 History of bladder cancer - 11/07/2018, (Stable), We discussed the fact that I did not believe his hematuria had anything to do with his history of bladder cancer since he had upper tract evaluation and cystoscopy both recently with no abnormalities noted., - 07/16/2018 (Stable), He had no evidence of recurrent transitional cell carcinoma the bladder. I did see radiation changes that were more prominent toward the dome and anterior wall. No papillary lesions were identified. Urine will be sent for cytology today and I will continue to monitor him with cystoscopy again in 6 months., - 06/27/2018, - 03/08/2018 (Stable), He had no evidence of recurrent transitional cell carcinoma the bladder today. Scheduled for a CT scan in 1/20. I will see him back in 6 months for repeat surveillance cystoscopy, - 12/27/2017, - 05/17/2017 Dysuria (Stable), He is experiencing some dysuria, some frequency and some urgency which may be secondary to some radiation cystitis. I will rule out a UTI. - 06/27/2018, - 05/17/2017 Bladder Cancer  overlapping sites, He has elected to proceed with chemo radiation. As such I am going to schedule him for a repeat resection to try to debulk as much tumor as possible in 4 weeks. In the interim I am going to schedule him for an appointment with Dr. Alen Blew. 2017-08-11 Bladder tumor/neoplasm, He has a large bladder tumor that in fact measures greater than 5 cm  cystoscopically. It involves the right floor of his bladder and could possibly also involve his right ureteral orifice. Because of that I told him he would be at a slight risk because of having only 1 kidney and I will therefore need to monitor him for hydronephrosis if his ureteral orifice is involved and requires resection. - 03/08/2017 Chronic prostatitis, Prostatitis, chronic - 08/12/2015 BPH w/LUTS, Benign prostatic hyperplasia with urinary obstruction - 08/11/12 Encounter for Prostate Cancer screening, Prostate cancer screening - 2012/08/11 Inflammatory Disease Prostate, Unspec, Prostatitis - 08/11/12 Renal calculus, Kidney stone on right side - 2012-08-11 Testicular atrophy, Testicular atrophy - Aug 11, 2012 Ureteral calculus, Ureteral Stone 08/11/2012      PMH Notes: BPH with outlet obstruction: This has been present for a number of years.  Current therapy: Rapaflo   Chronic prostatitis: He has had difficulty with this over the years. It has responded to antibiotic therapy.   Nephrolithiasis: He underwent a CT scan in 12/09 which revealed a 2 mm stone in the right kidney. He also passed a stone at that time. His stone analysis revealed calcium oxalate.   Solitary right kidney: He has congenital absence of the left kidney.   Bladder cancer: After experiencing gross hematuria he was evaluated with no abnormality of the upper tracts by CT scan the large tumor was identified on the floor the bladder on the right-hand side.  TURBT 04/14/17: I resected a 5.5 cm tumor from the floor of the bladder on the right hand side.  Pathology: Invasive high-grade transitional cell carcinoma.  Stage/grade: T2a, G3  Repeat TURBT 05/22/17: All visible remaining tumor resected.  Treatment: Chemoradiation completed 08/17/17.   NON-GU PMH: Pyuria/other UA findings, His urine did have white blood cells but only a few bacteria. Because he is having some dysuria I am going to culture his urine. - 06/27/2018 Encounter for general adult medical  examination without abnormal findings, Encounter for preventive health examination - 12-Aug-2015 Renal agenesis, unspecified, Congenital absence of kidney - 08-11-2012 Sebaceous cyst, Sebaceous cyst - 08/11/12    FAMILY HISTORY: Death In The Family Father - Runs In Family Death In The Family Mother - Runs In Family   SOCIAL HISTORY: Marital Status: Married Preferred Language: English; Ethnicity: Not Hispanic Or Latino; Race: White Current Smoking Status: Patient does not smoke anymore. Has not smoked since 02/24/1967.   Tobacco Use Assessment Completed: Used Tobacco in last 30 days? Does drink.  Drinks 1 caffeinated drink per day. Patient's occupation is/was retired.     Notes: Former smoker, Occupation:, Alcohol Use, Caffeine Use, Marital History - Currently Married   REVIEW OF SYSTEMS:    GU Review Male:   Patient reports burning/ pain with urination and get up at night to urinate. Patient denies frequent urination, hard to postpone urination, leakage of urine, stream starts and stops, trouble starting your stream, have to strain to urinate , erection problems, and penile pain.  Gastrointestinal (Upper):   Patient denies indigestion/ heartburn, nausea, and vomiting.  Gastrointestinal (Lower):   Patient reports constipation. Patient denies diarrhea.  Constitutional:   Patient denies fever, night sweats, weight  loss, and fatigue.  Skin:   Patient denies skin rash/ lesion and itching.  Eyes:   Patient denies blurred vision and double vision.  Ears/ Nose/ Throat:   Patient denies sore throat and sinus problems.  Hematologic/Lymphatic:   Patient denies swollen glands and easy bruising.  Cardiovascular:   Patient denies leg swelling and chest pains.  Respiratory:   Patient denies cough and shortness of breath.  Endocrine:   Patient denies excessive thirst.  Musculoskeletal:   Patient reports back pain. Patient denies joint pain.  Neurological:   Patient reports headaches. Patient denies dizziness.   Psychologic:   Patient denies depression and anxiety.   VITAL SIGNS:      11/15/2018 08:43 AM  Weight 155 lb / 70.31 kg  Height 69.5 in / 176.53 cm  BP 135/77 mmHg  Pulse 82 /min  Temperature 97.7 F / 36.5 C  BMI 22.6 kg/m   GU PHYSICAL EXAMINATION:    Urethral Meatus: Normal size. No lesion, no wart, no discharge, no polyp. Normal location.  Penis: Circumcised, no warts, no cracks. No dorsal Peyronie's plaques, no left corporal Peyronie's plaques, no right corporal Peyronie's plaques, no scarring, no warts. No balanitis, no meatal stenosis.   MULTI-SYSTEM PHYSICAL EXAMINATION:    Constitutional: Well-nourished. No physical deformities. Normally developed. Good grooming.  Respiratory: No labored breathing, no use of accessory muscles.   Cardiovascular: Normal temperature, no swelling, no varicosities.   Skin: No paleness, no jaundice, no cyanosis. No lesion, no ulcer, no rash.  Neurologic / Psychiatric: Oriented to time, oriented to place, oriented to person. No depression, no anxiety, no agitation.  Gastrointestinal: No mass, no tenderness, no rigidity, non obese abdomen.  Musculoskeletal: Spine, ribs, pelvis no bilateral tenderness. Normal gait and station of head and neck.        PAST DATA REVIEWED:  Source Of History:  Patient  Lab Test Review:   Urine Cytology  Records Review:   Previous Doctor Records, Previous Hospital Records, Previous Patient Records, POC Tool  Urine Test Review:   Urine Culture  X-Ray Review: C.T. Chest/ Abd/Pelvis: Reviewed Films. Reviewed Report. EXAM: CT CHEST, ABDOMEN, AND PELVIS WITH CONTRAST   TECHNIQUE: Multidetector CT imaging of the chest, abdomen and pelvis was performed following the standard protocol during bolus administration of intravenous contrast.   CONTRAST: 139mL OMNIPAQUE IOHEXOL 300 MG/ML SOLN   COMPARISON: 11/16/2017   FINDINGS: CT CHEST FINDINGS   Cardiovascular: Heart size appears within normal limits. Aortic atherosclerosis. Stable  ascending thoracic aortic aneurysm measuring 4.3 cm. Calcification in the LAD and RCA coronary arteries noted.   Mediastinum/Nodes: Normal appearance of the thyroid gland. The trachea appears patent and is midline. Normal appearance of the esophagus. No supraclavicular, axillary, mediastinal or hilar adenopathy.   Lungs/Pleura: No pleural effusion identified. Mild scarring within the lower lobes. No suspicious pulmonary nodules. Calcified granuloma is again noted in the left lower lobe.   Musculoskeletal: No chest wall mass or suspicious bone lesions identified.   CT ABDOMEN PELVIS FINDINGS   Hepatobiliary: Unchanged 4 mm low-density structure in the posterior dome, image 46/2. The gallbladder appears normal. No biliary dilatation.   Pancreas: Unremarkable. No pancreatic ductal dilatation or surrounding inflammatory changes.   Spleen: Normal in size without focal abnormality.   Adrenals/Urinary Tract: Normal appearance of the adrenal glands. Status post left nephrectomy. Small low-density cortical lesion in the right kidney is unchanged measuring 8 mm, image 70/2. No right hydronephrosis. Delayed images demonstrate excretion of contrast material into the right  renal collecting system and patent right ureter. Irregular posterior right bladder wall mass now with predominantly irregular thickening along the right posterior bladder wall and bladder base, image 58/11. Unchanged from previous exam.   Stomach/Bowel: Stomach normal. No dilated small or large bowel loops. There is mild wall thickening involving the terminal wall thickening involving right lower quadrant small bowel loops and cecum is again noted which may reflect sequelae of radiation enteritis. No discrete mass. No pathologic dilatation of the colon.   Vascular/Lymphatic: Aortic atherosclerosis. No aneurysm. No enlarged lymph nodes identified within the abdomen or pelvis. No inguinal adenopathy.   Reproductive: Prostate gland appears enlarged with mass  effect upon bladder base. Unchanged appearance of asymmetric right seminal vesicle enlargement, image 114/2.   Other: Right inguinal hernia contains a small volume of fluid, similar to previous study.   Musculoskeletal: No acute or significant osseous findings.   IMPRESSION: 1. Stable exam. Unchanged appearance of irregular wall thickening involving the right bladder wall in region of previously noted lobular mass. 2. No findings identified to suggest metastatic disease to the chest, abdomen or pelvis. 3. Stable ascending thoracic aortic aneurysm at 4.3 cm. Recommend annual imaging followup by CTA or MRA. This recommendation follows 2010 ACCF/AHA/AATS/ACR/ASA/SCA/SCAI/SIR/STS/SVM Guidelines for the Diagnosis and Management of Patients with Thoracic Aortic Disease. Circulation. 2010; 121: M196-Q229. Aortic aneurysm NOS (ICD10-I71.9) 4. Aortic atherosclerosis and coronary artery atherosclerotic calcifications. Aortic Atherosclerosis     06/15/11 05/31/10 03/17/08 04/17/07 03/14/07 02/14/06 02/16/05 02/20/04  PSA  Total PSA 1.28  1.48  2.55  1.97  3.78  1.16  1.19  0.84    Notes:                     A cytology in 3/20 was negative and a urine culture done at his last visit was also noted to be free of infection.   PROCEDURES:         Flexible Cystoscopy - 11/15/18 Risks, benefits, and some of the potential complications were discussed. Sterile technique and 2% Lidocaine intraurethral analgesia were used.  Meatus:  Normal size. Normal location. Normal condition.  Urethra:  No strictures.  External Sphincter:  Normal.  Verumontanum:  Normal.  Prostate:  Obstructing. Moderate hyperplasia.  Bladder Neck:  Non-obstructing.  Ureteral Orifices:  Normal location. Normal size. Normal shape. Effluxed clear urine.  Bladder:  Severe trabeculation. Cellules. A trigone tumor. 1 cm tumor. Solitary tumor. Normal mucosa. No stones.An old scar was noted on the floor of the bladder on the right-hand side.       The  lower urinary tract was carefully examined. The procedure was well-tolerated and without complications. Instructions were given to call the office immediately for bloody urine, difficulty urinating, urinary retention, painful or frequent urination, fever or other illness. The patient stated that he understood these instructions and would comply with them.         Urinalysis w/Scope - 81001 Dipstick Dipstick Cont'd Micro  Color: Amber Bilirubin: Neg mg/dL WBC/hpf: 6 - 10/hpf  Color: Amber Bilirubin: Neg mg/dL WBC/hpf: 6 - 10/hpf  Appearance: Cloudy Ketones: Neg mg/dL RBC/hpf: >60/hpf  Appearance: Cloudy Ketones: Neg mg/dL RBC/hpf: >60/hpf  Specific Gravity: 1.015 Blood: 3+ ery/uL Bacteria: Few (10-25/hpf)  Specific Gravity: 1.015 Blood: 3+ ery/uL Bacteria: Few (10-25/hpf)  pH: 7.5 Protein: Trace mg/dL Cystals: NS (Not Seen)  pH: 7.5 Protein: Trace mg/dL Cystals: NS (Not Seen)  Glucose: Neg mg/dL Urobilinogen: 0.2 mg/dL Casts: NS (Not Seen)  Glucose: Neg mg/dL Urobilinogen: 0.2  mg/dL Casts: NS (Not Seen)    Nitrites: Neg Trichomonas: Not Present    Nitrites: Neg Trichomonas: Not Present    Leukocyte Esterase: 2+ leu/uL Mucous: Present    Leukocyte Esterase: 2+ leu/uL Mucous: Present      Epithelial Cells: NS (Not Seen)      Epithelial Cells: 0 - 5/hpf      Yeast: NS (Not Seen)      Yeast: NS (Not Seen)      Sperm: Not Present      Sperm: Not Present     ASSESSMENT/PLAN:     ICD-10 Details  1 GU:   Bladder tumor/neoplasm - D41.4 It appears that his gross hematuria is secondary to a recurrent papillary tumor located on the floor of the bladder just behind the trigone. I did not see any other tumors within the bladder although I told him that I would like to not only resect the recurrent tumor but also biopsy the right wall of the bladder where his previous tumor was located. He is going to be scheduled for transurethral resection of his recurrent bladder tumor noted on the floor.  2    History of bladder cancer - Z85.51 Stable - I am also going to plan to biopsy the right wall of his bladder.

## 2018-12-07 ENCOUNTER — Ambulatory Visit (HOSPITAL_BASED_OUTPATIENT_CLINIC_OR_DEPARTMENT_OTHER): Payer: Medicare Other | Admitting: Certified Registered"

## 2018-12-07 ENCOUNTER — Ambulatory Visit (HOSPITAL_BASED_OUTPATIENT_CLINIC_OR_DEPARTMENT_OTHER)
Admission: RE | Admit: 2018-12-07 | Discharge: 2018-12-07 | Disposition: A | Discharge status: Home / Self Care | Payer: Medicare Other | Attending: Urology | Admitting: Urology

## 2018-12-07 ENCOUNTER — Encounter (HOSPITAL_BASED_OUTPATIENT_CLINIC_OR_DEPARTMENT_OTHER)
Admission: RE | Disposition: A | Discharge status: Home / Self Care | Payer: Self-pay | Source: Home / Self Care | Attending: Urology

## 2018-12-07 ENCOUNTER — Other Ambulatory Visit: Payer: Self-pay

## 2018-12-07 ENCOUNTER — Encounter (HOSPITAL_BASED_OUTPATIENT_CLINIC_OR_DEPARTMENT_OTHER): Payer: Self-pay | Admitting: *Deleted

## 2018-12-07 DIAGNOSIS — M1990 Unspecified osteoarthritis, unspecified site: Secondary | ICD-10-CM | POA: Insufficient documentation

## 2018-12-07 DIAGNOSIS — I10 Essential (primary) hypertension: Secondary | ICD-10-CM | POA: Insufficient documentation

## 2018-12-07 DIAGNOSIS — Z8551 Personal history of malignant neoplasm of bladder: Secondary | ICD-10-CM | POA: Insufficient documentation

## 2018-12-07 DIAGNOSIS — N401 Enlarged prostate with lower urinary tract symptoms: Secondary | ICD-10-CM | POA: Diagnosis not present

## 2018-12-07 DIAGNOSIS — Z923 Personal history of irradiation: Secondary | ICD-10-CM | POA: Diagnosis not present

## 2018-12-07 DIAGNOSIS — D494 Neoplasm of unspecified behavior of bladder: Secondary | ICD-10-CM

## 2018-12-07 DIAGNOSIS — K219 Gastro-esophageal reflux disease without esophagitis: Secondary | ICD-10-CM | POA: Insufficient documentation

## 2018-12-07 DIAGNOSIS — Q600 Renal agenesis, unilateral: Secondary | ICD-10-CM | POA: Insufficient documentation

## 2018-12-07 DIAGNOSIS — Z885 Allergy status to narcotic agent status: Secondary | ICD-10-CM | POA: Diagnosis not present

## 2018-12-07 DIAGNOSIS — Z79899 Other long term (current) drug therapy: Secondary | ICD-10-CM | POA: Diagnosis not present

## 2018-12-07 DIAGNOSIS — N138 Other obstructive and reflux uropathy: Secondary | ICD-10-CM | POA: Diagnosis not present

## 2018-12-07 DIAGNOSIS — Z87891 Personal history of nicotine dependence: Secondary | ICD-10-CM | POA: Diagnosis not present

## 2018-12-07 DIAGNOSIS — Z881 Allergy status to other antibiotic agents status: Secondary | ICD-10-CM | POA: Diagnosis not present

## 2018-12-07 DIAGNOSIS — R310 Gross hematuria: Secondary | ICD-10-CM | POA: Diagnosis not present

## 2018-12-07 DIAGNOSIS — C674 Malignant neoplasm of posterior wall of bladder: Secondary | ICD-10-CM | POA: Insufficient documentation

## 2018-12-07 HISTORY — DX: Personal history of irradiation: Z92.3

## 2018-12-07 HISTORY — DX: Presence of spectacles and contact lenses: Z97.3

## 2018-12-07 HISTORY — PX: TRANSURETHRAL RESECTION OF BLADDER TUMOR: SHX2575

## 2018-12-07 HISTORY — DX: Personal history of other diseases of the circulatory system: Z86.79

## 2018-12-07 HISTORY — DX: Personal history of antineoplastic chemotherapy: Z92.21

## 2018-12-07 SURGERY — TURBT (TRANSURETHRAL RESECTION OF BLADDER TUMOR)
Anesthesia: General | Site: Bladder

## 2018-12-07 MED ORDER — ONDANSETRON HCL 4 MG/2ML IJ SOLN
4.0000 mg | Freq: Once | INTRAMUSCULAR | Status: DC | PRN
  Filled 2018-12-07: qty 2

## 2018-12-07 MED ORDER — SODIUM CHLORIDE 0.9 % IV SOLN
INTRAVENOUS | Status: DC
  Administered 2018-12-07 (×2): via INTRAVENOUS
  Filled 2018-12-07: qty 1000

## 2018-12-07 MED ORDER — PROPOFOL 10 MG/ML IV BOLUS
INTRAVENOUS | Status: DC | PRN
  Administered 2018-12-07: 150 mg via INTRAVENOUS

## 2018-12-07 MED ORDER — HYDROCODONE-ACETAMINOPHEN 10-325 MG PO TABS: 1.0000 | ORAL_TABLET | ORAL | 0 refills | Status: DC | PRN

## 2018-12-07 MED ORDER — IBUPROFEN 200 MG PO TABS
200.0000 mg | ORAL_TABLET | Freq: Four times a day (QID) | ORAL | Status: DC | PRN
  Filled 2018-12-07: qty 2

## 2018-12-07 MED ORDER — LIDOCAINE 2% (20 MG/ML) 5 ML SYRINGE
INTRAMUSCULAR | Status: AC
  Filled 2018-12-07: qty 5

## 2018-12-07 MED ORDER — LIDOCAINE 2% (20 MG/ML) 5 ML SYRINGE
INTRAMUSCULAR | Status: DC | PRN
  Administered 2018-12-07: 100 mg via INTRAVENOUS

## 2018-12-07 MED ORDER — DEXAMETHASONE SODIUM PHOSPHATE 10 MG/ML IJ SOLN
INTRAMUSCULAR | Status: AC
  Filled 2018-12-07: qty 1

## 2018-12-07 MED ORDER — SODIUM CHLORIDE 0.9 % IR SOLN
Status: DC | PRN
  Administered 2018-12-07: 6000 mL via INTRAVESICAL

## 2018-12-07 MED ORDER — EPHEDRINE 5 MG/ML INJ
INTRAVENOUS | Status: AC
  Filled 2018-12-07: qty 10

## 2018-12-07 MED ORDER — MEPERIDINE HCL 25 MG/ML IJ SOLN
6.2500 mg | INTRAMUSCULAR | Status: DC | PRN
  Filled 2018-12-07: qty 1

## 2018-12-07 MED ORDER — FENTANYL CITRATE (PF) 100 MCG/2ML IJ SOLN
INTRAMUSCULAR | Status: AC
  Filled 2018-12-07: qty 2

## 2018-12-07 MED ORDER — GEMCITABINE CHEMO FOR BLADDER INSTILLATION 2000 MG
2000.0000 mg | Freq: Once | INTRAVENOUS | Status: AC
  Administered 2018-12-07: 2000 mg via INTRAVESICAL
  Filled 2018-12-07: qty 2000

## 2018-12-07 MED ORDER — IBUPROFEN 100 MG/5ML PO SUSP
200.0000 mg | Freq: Four times a day (QID) | ORAL | Status: DC | PRN
  Filled 2018-12-07: qty 30

## 2018-12-07 MED ORDER — FENTANYL CITRATE (PF) 100 MCG/2ML IJ SOLN
25.0000 ug | INTRAMUSCULAR | Status: DC | PRN
  Administered 2018-12-07: 25 ug via INTRAVENOUS
  Filled 2018-12-07: qty 1

## 2018-12-07 MED ORDER — DEXAMETHASONE SODIUM PHOSPHATE 10 MG/ML IJ SOLN
INTRAMUSCULAR | Status: DC | PRN
  Administered 2018-12-07: 5 mg via INTRAVENOUS

## 2018-12-07 MED ORDER — CIPROFLOXACIN IN D5W 400 MG/200ML IV SOLN
INTRAVENOUS | Status: AC
  Filled 2018-12-07: qty 200

## 2018-12-07 MED ORDER — ONDANSETRON HCL 4 MG/2ML IJ SOLN
INTRAMUSCULAR | Status: DC | PRN
  Administered 2018-12-07: 4 mg via INTRAVENOUS

## 2018-12-07 MED ORDER — PHENAZOPYRIDINE HCL 200 MG PO TABS: 200.0000 mg | ORAL_TABLET | Freq: Three times a day (TID) | ORAL | 0 refills | Status: DC | PRN

## 2018-12-07 MED ORDER — ONDANSETRON HCL 4 MG/2ML IJ SOLN
INTRAMUSCULAR | Status: AC
  Filled 2018-12-07: qty 2

## 2018-12-07 MED ORDER — FENTANYL CITRATE (PF) 100 MCG/2ML IJ SOLN
INTRAMUSCULAR | Status: DC | PRN
  Administered 2018-12-07 (×2): 50 ug via INTRAVENOUS

## 2018-12-07 MED ORDER — CIPROFLOXACIN IN D5W 400 MG/200ML IV SOLN
400.0000 mg | Freq: Once | INTRAVENOUS | Status: AC
  Administered 2018-12-07: 400 mg via INTRAVENOUS
  Filled 2018-12-07: qty 200

## 2018-12-07 MED ORDER — EPHEDRINE SULFATE-NACL 50-0.9 MG/10ML-% IV SOSY
PREFILLED_SYRINGE | INTRAVENOUS | Status: DC | PRN
  Administered 2018-12-07: 10 mg via INTRAVENOUS

## 2018-12-07 SURGICAL SUPPLY — 29 items
BAG DRAIN URO-CYSTO SKYTR STRL (DRAIN) ×2 IMPLANT
BAG DRN ANRFLXCHMBR STRAP LEK (BAG)
BAG DRN UROCATH (DRAIN) ×1
BAG URINE DRAINAGE (UROLOGICAL SUPPLIES) IMPLANT
BAG URINE LEG 19OZ MD ST LTX (BAG) IMPLANT
CATH FOLEY 2WAY SLVR  5CC 20FR (CATHETERS) ×1
CATH FOLEY 2WAY SLVR  5CC 22FR (CATHETERS)
CATH FOLEY 2WAY SLVR  5CC 24FR (CATHETERS) ×1
CATH FOLEY 2WAY SLVR 5CC 20FR (CATHETERS) IMPLANT
CATH FOLEY 2WAY SLVR 5CC 22FR (CATHETERS) IMPLANT
CATH FOLEY 2WAY SLVR 5CC 24FR (CATHETERS) ×1 IMPLANT
CATH FOLEY 3WAY 20FR (CATHETERS) IMPLANT
CLOTH BEACON ORANGE TIMEOUT ST (SAFETY) ×2 IMPLANT
ELECT REM PT RETURN 9FT ADLT (ELECTROSURGICAL)
ELECTRODE REM PT RTRN 9FT ADLT (ELECTROSURGICAL) ×1 IMPLANT
EVACUATOR MICROVAS BLADDER (UROLOGICAL SUPPLIES) ×1 IMPLANT
GLOVE BIO SURGEON STRL SZ8 (GLOVE) ×2 IMPLANT
GOWN STRL REUS W/ TWL XL LVL3 (GOWN DISPOSABLE) ×1 IMPLANT
GOWN STRL REUS W/TWL XL LVL3 (GOWN DISPOSABLE) ×2
HOLDER FOLEY CATH W/STRAP (MISCELLANEOUS) ×1 IMPLANT
IV NS IRRIG 3000ML ARTHROMATIC (IV SOLUTION) ×4 IMPLANT
KIT TURNOVER CYSTO (KITS) ×2 IMPLANT
LOOP CUT BIPOLAR 24F LRG (ELECTROSURGICAL) ×1 IMPLANT
MANIFOLD NEPTUNE II (INSTRUMENTS) ×2 IMPLANT
PACK CYSTO (CUSTOM PROCEDURE TRAY) ×2 IMPLANT
PLUG CATH AND CAP STER (CATHETERS) ×1 IMPLANT
TUBE CONNECTING 12X1/4 (SUCTIONS) ×2 IMPLANT
TUBING UROLOGY SET (TUBING) ×1 IMPLANT
WATER STERILE IRR 3000ML UROMA (IV SOLUTION) IMPLANT

## 2018-12-07 NOTE — OR Nursing (Signed)
Foley cathetor inserted 16 f without difficulty. Pt emptys 300cc  Clear yellow urine initially then became pinkish to cherry tinged.  instrucrted onhow to switch out bag if needed. Instructed on how to remove foley cathetor Monday morning,.  Pt verbalizes inderstanding. States he has done this in the past.  Instructed to call if he runs into difficulties. Darin Engels rn

## 2018-12-07 NOTE — Anesthesia Preprocedure Evaluation (Signed)
Anesthesia Evaluation  Patient identified by MRN, date of birth, ID band Patient awake    Reviewed: Allergy & Precautions, NPO status , Patient's Chart, lab work & pertinent test results  History of Anesthesia Complications Negative for: history of anesthetic complications  Airway Mallampati: II  TM Distance: >3 FB Neck ROM: Full    Dental no notable dental hx. (+) Dental Advisory Given, Teeth Intact,    Pulmonary former smoker,    Pulmonary exam normal breath sounds clear to auscultation       Cardiovascular hypertension, Pt. on medications negative cardio ROS Normal cardiovascular exam Rhythm:Regular Rate:Normal     Neuro/Psych negative neurological ROS     GI/Hepatic negative GI ROS, Neg liver ROS, GERD  Controlled and Medicated,  Endo/Other  negative endocrine ROS  Renal/GU Bladder CA  negative genitourinary   Musculoskeletal  (+) Arthritis , Osteoarthritis,    Abdominal Normal abdominal exam  (+)   Peds negative pediatric ROS (+)  Hematology negative hematology ROS (+)   Anesthesia Other Findings   Reproductive/Obstetrics negative OB ROS                             Anesthesia Physical  Anesthesia Plan  ASA: II  Anesthesia Plan: General   Post-op Pain Management:    Induction: Intravenous  PONV Risk Score and Plan: 3 and Treatment may vary due to age or medical condition, Ondansetron and Dexamethasone  Airway Management Planned: LMA  Additional Equipment:   Intra-op Plan:   Post-operative Plan: Extubation in OR  Informed Consent: I have reviewed the patients History and Physical, chart, labs and discussed the procedure including the risks, benefits and alternatives for the proposed anesthesia with the patient or authorized representative who has indicated his/her understanding and acceptance.     Dental advisory given  Plan Discussed with: CRNA  Anesthesia  Plan Comments: (  )        Anesthesia Quick Evaluation

## 2018-12-07 NOTE — Anesthesia Procedure Notes (Signed)
Procedure Name: LMA Insertion Date/Time: 12/07/2018 9:16 AM Performed by: Suan Halter, CRNA Pre-anesthesia Checklist: Patient identified, Emergency Drugs available, Suction available and Patient being monitored Patient Re-evaluated:Patient Re-evaluated prior to induction Oxygen Delivery Method: Circle system utilized Preoxygenation: Pre-oxygenation with 100% oxygen Induction Type: IV induction Ventilation: Mask ventilation without difficulty LMA: LMA inserted LMA Size: 4.0 Number of attempts: 1 Airway Equipment and Method: Bite block Placement Confirmation: positive ETCO2 Tube secured with: Tape Dental Injury: Teeth and Oropharynx as per pre-operative assessment

## 2018-12-07 NOTE — Addendum Note (Signed)
Addendum  created 12/07/18 1512 by Suan Halter, CRNA   Charge Capture section accepted

## 2018-12-07 NOTE — Discharge Instructions (Signed)
Transurethral Resection of Bladder Tumor (TURBT)   Definition:  Transurethral Resection of the Bladder Tumor is a surgical procedure used to diagnose and remove tumors within the bladder. TURBT is the most common treatment for early stage bladder cancer.  General instructions:     Your recent bladder surgery requires very little post hospital care but some definite precautions.  Despite the fact that no skin incisions were used, the area around the bladder incisions are raw and covered with scabs to promote healing and prevent bleeding. Certain precautions are needed to insure that the scabs are not disturbed over the next 2-4 weeks while the healing proceeds.  Because the raw surface inside your bladder and the irritating effects of urine you may expect frequency of urination and/or urgency (a stronger desire to urinate) and perhaps even getting up at night more often. This will usually resolve or improve slowly over the healing period. You may see some blood in your urine over the first 6 weeks. Do not be alarmed, even if the urine was clear for a while. Get off your feet and drink lots of fluids until clearing occurs. If you start to pass clots or don't improve call us.  Catheter: (If you are discharged with a catheter.)  1. Keep your catheter secured to your leg at all times with tape or the supplied strap. 2. You may experience leakage of urine around your catheter- as long as the  catheter continues to drain, this is normal.  If your catheter stops draining  go to the ER. 3. You may also have blood in your urine, even after it has been clear for  several days; you may even pass some small blood clots or other material.  This  is normal as well.  If this happens, sit down and drink plenty of water to help  make urine to flush out your bladder.  If the blood in your urine becomes worse  after doing this, contact our office or return to the ER. 4. You may use the leg bag (small bag)  during the day, but use the large bag at  night.  Diet:  You may return to your normal diet immediately. Because of the raw surface of your bladder, alcohol, spicy foods, foods high in acid and drinks with caffeine may cause irritation or frequency and should be used in moderation. To keep your urine flowing freely and avoid constipation, drink plenty of fluids during the day (8-10 glasses). Tip: Avoid cranberry juice because it is very acidic.  Activity:  Your physical activity doesn't need to be restricted. However, if you are very active, you may see some blood in the urine. We suggest that you reduce your activity under the circumstances until the bleeding has stopped.  Bowels:  It is important to keep your bowels regular during the postoperative period. Straining with bowel movements can cause bleeding. A bowel movement every other day is reasonable. Use a mild laxative if needed, such as milk of magnesia 2-3 tablespoons, or 2 Dulcolax tablets. Call if you continue to have problems. If you had been taking narcotics for pain, before, during or after your surgery, you may be constipated. Take a laxative if necessary.    Medication:  You should resume your pre-surgery medications unless told not to. In addition you may be given an antibiotic to prevent or treat infection. Antibiotics are not always necessary. All medication should be taken as prescribed until the bottles are finished unless you are having   an unusual reaction to one of the drugs.   Post Anesthesia Home Care Instructions  Activity: Get plenty of rest for the remainder of the day. A responsible individual must stay with you for 24 hours following the procedure.  For the next 24 hours, DO NOT: -Drive a car -Operate machinery -Drink alcoholic beverages -Take any medication unless instructed by your physician -Make any legal decisions or sign important papers.  Meals: Start with liquid foods such as gelatin or soup.  Progress to regular foods as tolerated. Avoid greasy, spicy, heavy foods. If nausea and/or vomiting occur, drink only clear liquids until the nausea and/or vomiting subsides. Call your physician if vomiting continues.  Special Instructions/Symptoms: Your throat may feel dry or sore from the anesthesia or the breathing tube placed in your throat during surgery. If this causes discomfort, gargle with warm salt water. The discomfort should disappear within 24 hours.  If you had a scopolamine patch placed behind your ear for the management of post- operative nausea and/or vomiting:  1. The medication in the patch is effective for 72 hours, after which it should be removed.  Wrap patch in a tissue and discard in the trash. Wash hands thoroughly with soap and water. 2. You may remove the patch earlier than 72 hours if you experience unpleasant side effects which may include dry mouth, dizziness or visual disturbances. 3. Avoid touching the patch. Wash your hands with soap and water after contact with the patch.        

## 2018-12-07 NOTE — Transfer of Care (Signed)
Immediate Anesthesia Transfer of Care Note  Patient: JASKARN SCHWEER  Procedure(s) Performed: Procedure(s) (LRB): CYSTOSCOPY, TRANSURETHRAL RESECTION OF BLADDER TUMOR (TURBT) WITH INSTILLATION OF POST OPERATIVE CHEMOTHERAPY (N/A)  Patient Location: PACU  Anesthesia Type: General  Level of Consciousness: awake, oriented, sedated and patient cooperative  Airway & Oxygen Therapy: Patient Spontanous Breathing and Patient connected to face mask oxygen  Post-op Assessment: Report given to PACU RN and Post -op Vital signs reviewed and stable  Post vital signs: Reviewed and stable  Complications: No apparent anesthesia complications  Last Pain:  Vitals:   12/07/18 0829  TempSrc:   PainSc: 0-No pain      Patients Stated Pain Goal: 5 (12/07/18 0829)

## 2018-12-07 NOTE — Op Note (Signed)
PATIENT:  Douglas Wood  PRE-OPERATIVE DIAGNOSIS: Bladder tumor  POST-OPERATIVE DIAGNOSIS: Same  PROCEDURE:  Procedure(s): 1. TRANSURETHRAL RESECTION OF BLADDER TUMOR (TURBT) (3 cm.) 2. Instillation of intravesical chemotherapy (Gemcitabine)  SURGEON:  Surgeon(s): Claybon Jabs  ANESTHESIA:   General  EBL:  Minimal  DRAINS: Urethral catheter (20 Fr. Foley)   SPECIMEN:  Bladder tumor  DISPOSITION OF SPECIMEN:  PATHOLOGY  Indication: Douglas Wood is an 85 year old male with a history of invasive, high-grade transitional cell carcinoma initially resected in 12/18 and then treated with chemoradiation.  He has been followed with surveillance cystoscopy and recently experienced an episode of gross hematuria.  A CT scan was obtained which revealed no abnormality of the upper tract or metastatic disease within the abdomen/pelvis.  Cystoscopically I found a bladder tumor on the posterior wall of the bladder.  He is brought to the operating room today for resection of his bladder tumor and instillation of intravesical chemotherapy.  Description of operation: The patient was taken to the operating room and administered general anesthesia. They were then placed on the table and moved to the dorsal lithotomy position after which the genitalia was sterilely prepped and draped. An official timeout was then performed.  The 71 French resectoscope with the 30 lens and visual obturator were then passed into the bladder under direct visualization. Urethra appeared normal. The visual obturator was then removed and the Gyrus resectoscope element with 30  lens was then inserted and the bladder was fully and systematically inspected.  The right ureteral orifice appeared normal.  The left ureteral orifices was absent (congenital absence of the left kidney).  The bladder had changes consistent with radiation.  A bladder tumor was identified on the posterior wall of the bladder.  It was papillary in configuration  and extended approximately 3 cm either side of midline.  I noted another small area of unusual appearing mucosa near the bladder neck on the right-hand side at the 8 o'clock position.  I first began by resecting the posterior wall bladder tumor.  This was carefully resected from the bladder wall until all visible tumor had been fully resected and then I fulgurated the base as well as the surrounding mucosa.  I then resected the small area of abnormal appearing mucosa near the bladder neck on the right side and fulgurated other areas of the bladder that appeared slightly suspicious but were probably inflammatory however these were all treated as well.  Reinspection of the bladder revealed all obvious tumor had been fully resected and there was no evidence of perforation. The Microvasive evacuator was then used to irrigate the bladder and remove all of the portions of bladder tumor which were sent to pathology. I then removed the resectoscope.  A 20 French Foley catheter was then inserted in the bladder and irrigated. The irrigant returned slightly pink with no clots. The patient was awakened and taken to the recovery room.  While in the recovery room 2 g of gemcitabine in 52.6 cc of sterile water was instilled in the bladder through the catheter and the catheter was plugged. This will remain indwelling for approximately one hour. It will then be drained from the bladder and the catheter will be removed and the patient discharged home.  PLAN OF CARE: Discharge to home after PACU  PATIENT DISPOSITION:  PACU - hemodynamically stable.

## 2018-12-07 NOTE — Anesthesia Postprocedure Evaluation (Signed)
Anesthesia Post Note  Patient: RYOMA NOFZIGER  Procedure(s) Performed: CYSTOSCOPY, TRANSURETHRAL RESECTION OF BLADDER TUMOR (TURBT) WITH INSTILLATION OF POST OPERATIVE CHEMOTHERAPY (N/A Bladder)     Patient location during evaluation: Phase II Anesthesia Type: General Level of consciousness: awake Pain management: pain level controlled Vital Signs Assessment: post-procedure vital signs reviewed and stable Respiratory status: spontaneous breathing Cardiovascular status: stable Postop Assessment: no apparent nausea or vomiting Anesthetic complications: no    Last Vitals:  Vitals:   12/07/18 1115 12/07/18 1130  BP: (!) 154/83   Pulse: 81 86  Resp: 11 15  Temp: (!) 36.3 C   SpO2: 95% 97%    Last Pain:  Vitals:   12/07/18 1106  TempSrc:   PainSc: 6    Pain Goal: Patients Stated Pain Goal: 5 (12/07/18 0829)                 Huston Foley

## 2018-12-10 ENCOUNTER — Encounter (HOSPITAL_BASED_OUTPATIENT_CLINIC_OR_DEPARTMENT_OTHER): Payer: Self-pay | Admitting: Urology

## 2018-12-13 ENCOUNTER — Telehealth: Payer: Self-pay | Admitting: Oncology

## 2018-12-13 NOTE — Telephone Encounter (Signed)
Called and spoke with patient. Confirmed feb 2021 appt  °

## 2018-12-18 ENCOUNTER — Ambulatory Visit (INDEPENDENT_AMBULATORY_CARE_PROVIDER_SITE_OTHER): Payer: Medicare Other | Admitting: Internal Medicine

## 2018-12-18 ENCOUNTER — Other Ambulatory Visit: Payer: Self-pay

## 2018-12-18 DIAGNOSIS — Z23 Encounter for immunization: Secondary | ICD-10-CM

## 2018-12-18 NOTE — Patient Instructions (Signed)
Patient received a flu injection, IM, L deltoid.

## 2018-12-18 NOTE — Progress Notes (Signed)
Flu vaccine given by CMA left arm

## 2019-01-18 ENCOUNTER — Ambulatory Visit
Admission: RE | Admit: 2019-01-18 | Discharge: 2019-01-18 | Disposition: A | Discharge status: Home / Self Care | Payer: Medicare Other | Source: Ambulatory Visit | Attending: Family Medicine | Admitting: Family Medicine

## 2019-01-18 ENCOUNTER — Other Ambulatory Visit: Payer: Self-pay

## 2019-01-18 DIAGNOSIS — S82842A Displaced bimalleolar fracture of left lower leg, initial encounter for closed fracture: Secondary | ICD-10-CM

## 2019-03-31 IMAGING — CT CT CHEST W/ CM
4 of 16 series · 12 of 46 positions shown, 17 images · IV contrast (iopamidol)
Comparison: CT abdomen pelvis 03/07/2017

CLINICAL DATA: History of urothelial carcinoma of the bladder wall.
Follow-up exam.

EXAM:
CT CHEST, ABDOMEN, AND PELVIS WITH CONTRAST
TECHNIQUE: Multidetector CT imaging of the chest, abdomen and pelvis was
performed following the standard protocol during bolus
administration of intravenous contrast.
CONTRAST:  100mL [id number] IOPAMIDOL ([id number]) INJECTION 61%

[Series 4: lung pre · axial · non-contrast · 0.71mm/px · z∈[-509,-245]mm · 5 of 232 slices shown]
[im 34/232  bone]
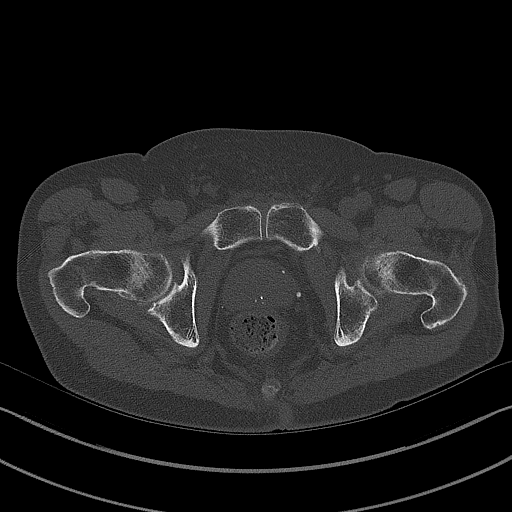
[im 67/232  bone]
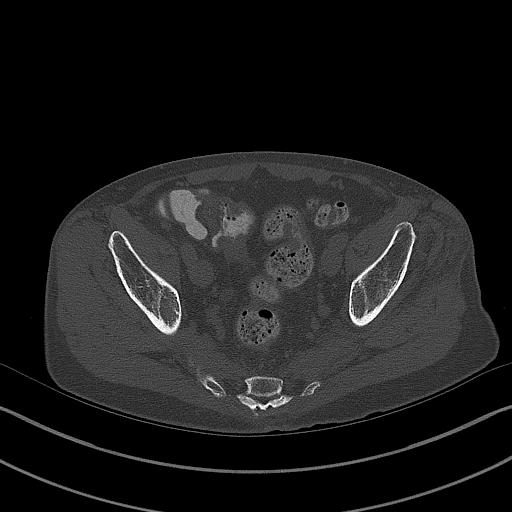
[im 100/232  bone]
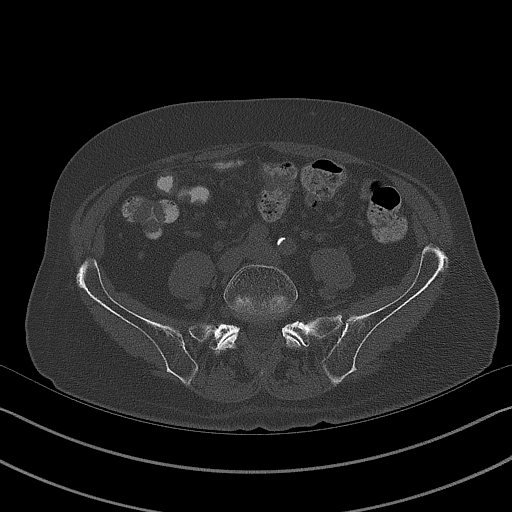
[im 133/232  bone]
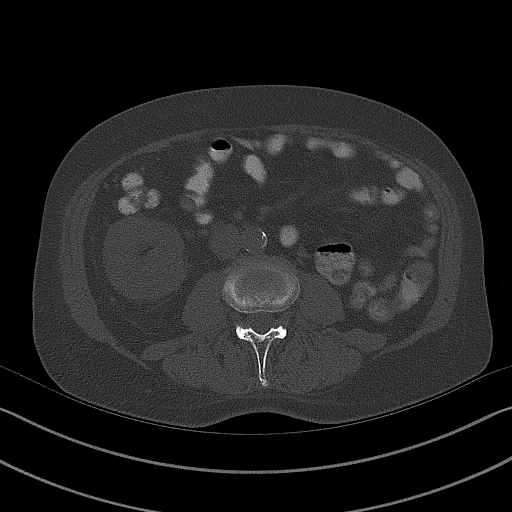
[im 166/232  bone]
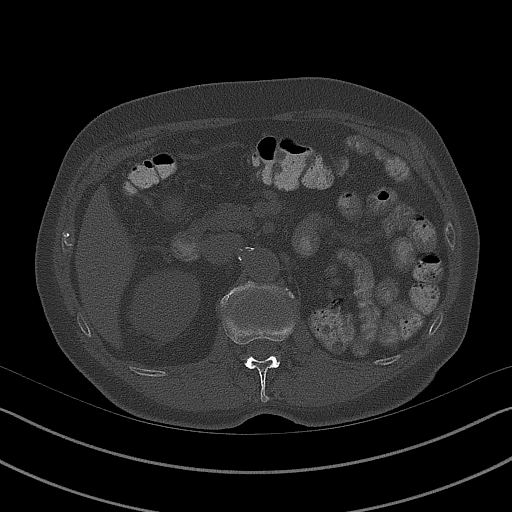

[Series 5: coronal pre · coronal · non-contrast · 0.72mm/px · 1 of 80 slices shown, 2 images]
[im 40/80  soft-tissue]
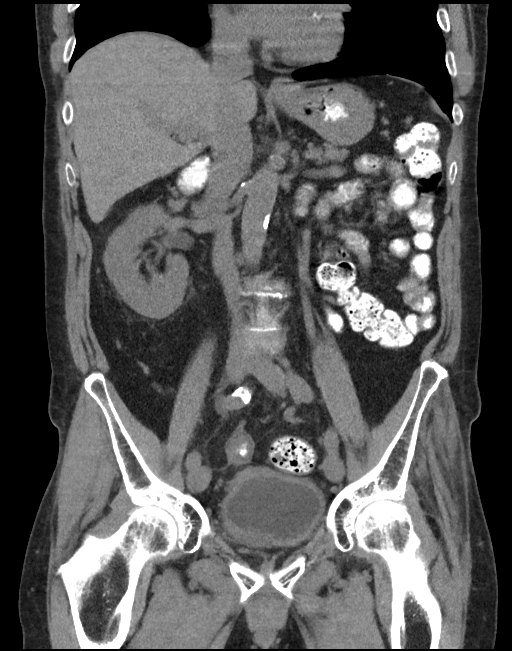
[im 40/80  bone]
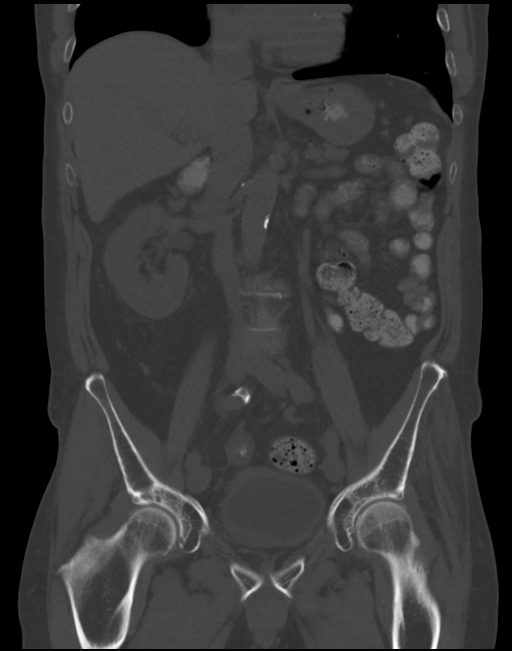

[Series 7: axial st · axial · 0.71mm/px · z∈[-158,-6]mm · 3 of 154 slices shown]
[im 39/154  soft-tissue]
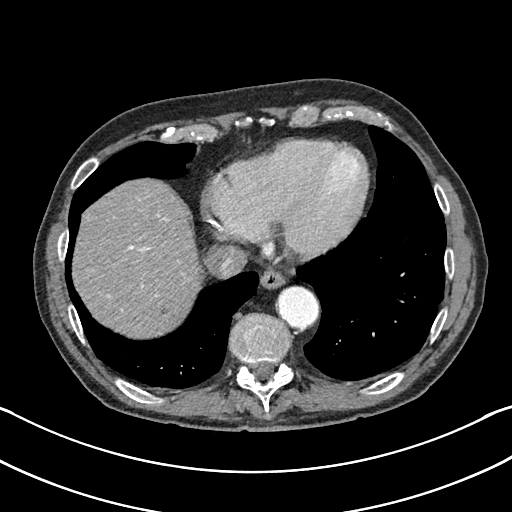
[im 77/154  soft-tissue]
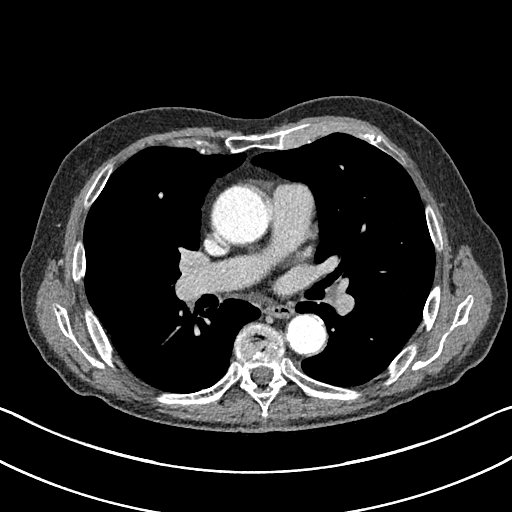
[im 115/154  soft-tissue]
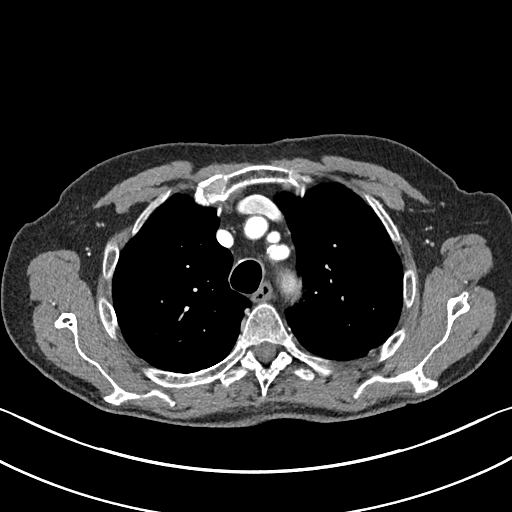

[Series 19: axial delay · axial · delayed · 0.80mm/px · z∈[-566,-91]mm · 3 of 96 slices shown, 7 images]
[im 1/96  soft-tissue]
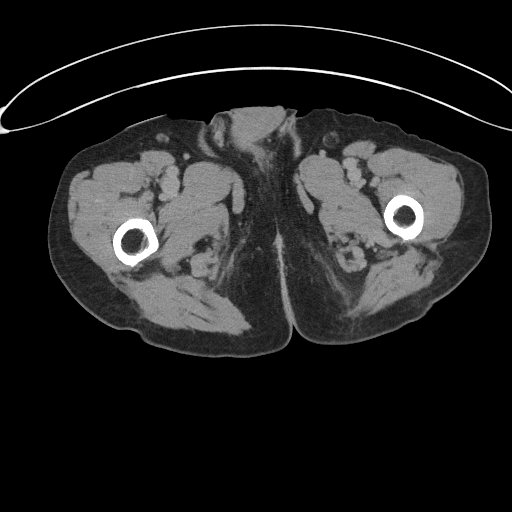
[im 1/96  lung]
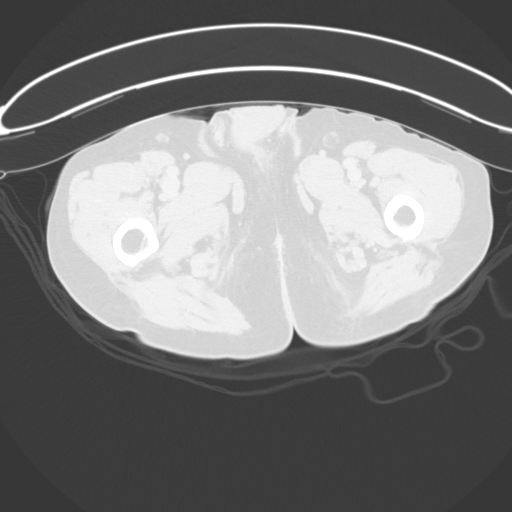
[im 1/96  bone]
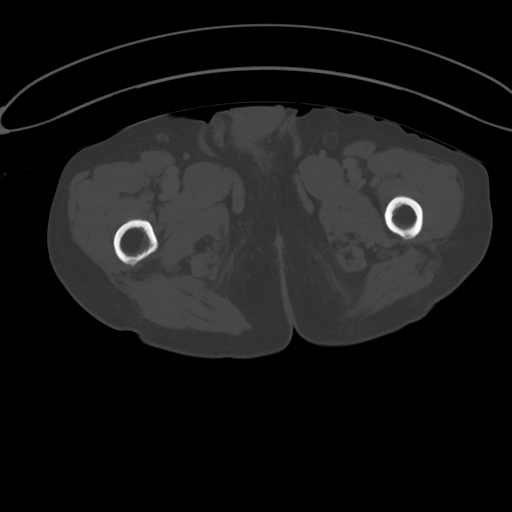
[im 48/96  soft-tissue]
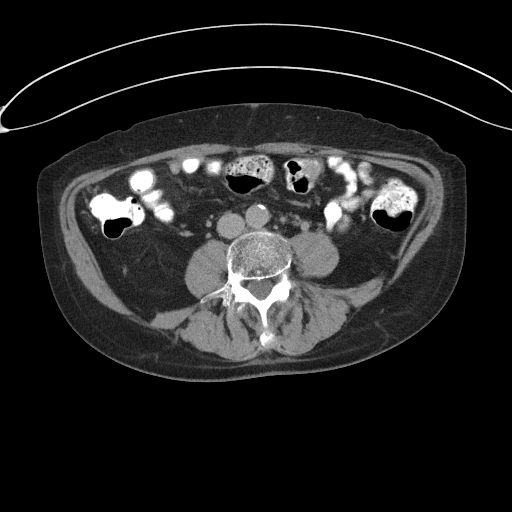
[im 48/96  lung]
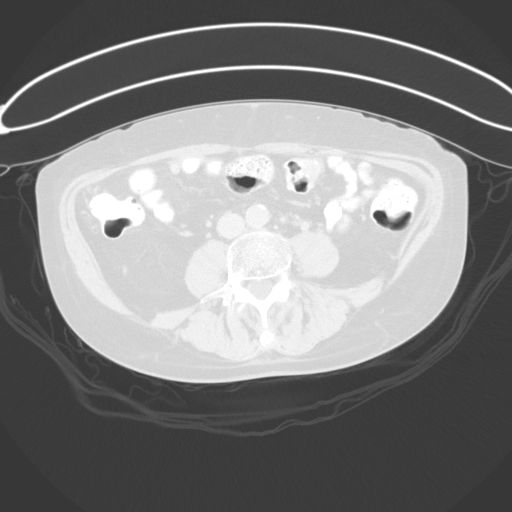
[im 96/96  soft-tissue]
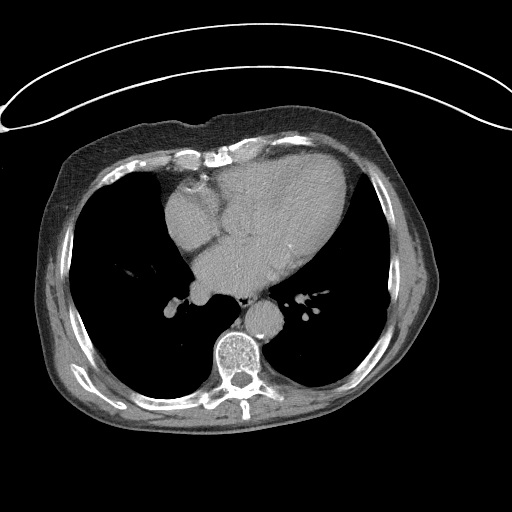
[im 96/96  lung]
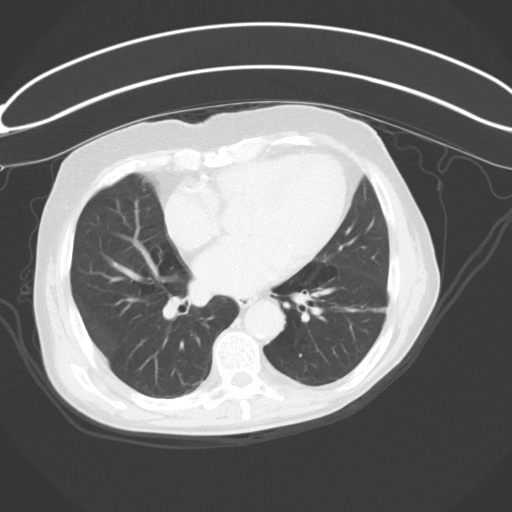

[12 of 46 positions shown; findings below may reference images not displayed]

FINDINGS: CT CHEST FINDINGS

Cardiovascular: Heart is mildly enlarged. Coronary arterial vascular
calcifications. Thoracic aortic vascular calcifications. Ascending
thoracic aorta measures 4.3 cm (image 68; series 7).

Mediastinum/Nodes: No enlarged axillary, mediastinal or hilar
lymphadenopathy. Normal appearance of the esophagus.

Lungs/Pleura: Central airways are patent. Dependent
atelectasis/scarring within the lower lobes bilaterally. No large
area pulmonary consolidation. No pleural effusion or pneumothorax.

Musculoskeletal: Thoracic spine degenerative changes. No aggressive
or acute appearing osseous lesions.

CT ABDOMEN PELVIS FINDINGS

Hepatobiliary: Stable subcentimeter low-attenuation lesion hepatic
dome (image 10; series 10). Gallbladder is decompressed. No
intrahepatic or extrahepatic biliary ductal dilatation.

Pancreas: Unremarkable

Spleen: Unremarkable

Adrenals/Urinary Tract: Normal adrenal glands. Left kidney is
congenitally absent. The right kidney enhances appropriately with
contrast. Multiple subcentimeter too small to characterize
low-attenuation lesions demonstrated within the right kidney. No
right-sided hydronephrosis. Delayed images demonstrate excretion of
contrast material into the renal collecting system and mid and
distal ureter. The proximal ureter is not well opacified. Interval
decrease in size of irregular posterior right bladder wall mass now
with predominantly irregular thickening along the right aspect of
the urinary bladder (image 76; series 19). More inferiorly there is
mass effect along the bladder, favored to be secondary to prostate
enlargement. Persistent wall thickening of the urinary bladder.

Stomach/Bowel: Oral contrast material to the level of the rectum. No
abnormal bowel wall thickening or evidence for bowel obstruction. No
evidence for free fluid or free intraperitoneal air. Normal
morphology of the stomach.

Vascular/Lymphatic: Normal caliber abdominal aorta. Peripheral
calcified atherosclerotic plaque. No retroperitoneal
lymphadenopathy.

Reproductive: Enlarged prostate with central dystrophic
calcifications.

Other: Small amount of fluid right inguinal canal.

Musculoskeletal: Lumbar spine degenerative changes. No aggressive or
acute appearing osseous lesions.
IMPRESSION: 1. Interval decrease in size of lobular mass within the right aspect
of the urinary bladder.
2. No evidence for metastatic disease within the chest, abdomen or
pelvis.
3. Ascending thoracic aorta measures 4.3 cm. Recommend annual
imaging followup by CTA or MRA. This recommendation follows 1626
ACCF/AHA/AATS/ACR/ASA/SCA/DJUNER/OGUNTUASE/LOMASNEY/SUOMAILA Guidelines for the
Diagnosis and Management of Patients with Thoracic Aortic Disease.
Circulation. 1626; 121: [id number]

## 2019-06-04 ENCOUNTER — Inpatient Hospital Stay: Payer: Medicare Other | Attending: Oncology

## 2019-06-04 ENCOUNTER — Other Ambulatory Visit: Payer: Self-pay

## 2019-06-04 ENCOUNTER — Encounter (HOSPITAL_COMMUNITY): Payer: Self-pay

## 2019-06-04 ENCOUNTER — Ambulatory Visit (HOSPITAL_COMMUNITY)
Admission: RE | Admit: 2019-06-04 | Discharge: 2019-06-04 | Disposition: A | Discharge status: Home / Self Care | Payer: Medicare Other | Source: Ambulatory Visit | Attending: Oncology | Admitting: Oncology

## 2019-06-04 DIAGNOSIS — I712 Thoracic aortic aneurysm, without rupture: Secondary | ICD-10-CM | POA: Insufficient documentation

## 2019-06-04 DIAGNOSIS — E785 Hyperlipidemia, unspecified: Secondary | ICD-10-CM | POA: Diagnosis not present

## 2019-06-04 DIAGNOSIS — I2510 Atherosclerotic heart disease of native coronary artery without angina pectoris: Secondary | ICD-10-CM | POA: Diagnosis not present

## 2019-06-04 DIAGNOSIS — I700 Atherosclerosis of aorta: Secondary | ICD-10-CM | POA: Insufficient documentation

## 2019-06-04 DIAGNOSIS — D72819 Decreased white blood cell count, unspecified: Secondary | ICD-10-CM | POA: Insufficient documentation

## 2019-06-04 DIAGNOSIS — Z79899 Other long term (current) drug therapy: Secondary | ICD-10-CM | POA: Diagnosis not present

## 2019-06-04 DIAGNOSIS — K219 Gastro-esophageal reflux disease without esophagitis: Secondary | ICD-10-CM | POA: Diagnosis not present

## 2019-06-04 DIAGNOSIS — C679 Malignant neoplasm of bladder, unspecified: Secondary | ICD-10-CM | POA: Diagnosis present

## 2019-06-04 LAB — CBC WITH DIFFERENTIAL (CANCER CENTER ONLY)
Abs Immature Granulocytes: 0.03 10*3/uL (ref 0.00–0.07)
Basophils Absolute: 0 10*3/uL (ref 0.0–0.1)
Basophils Relative: 1 %
Eosinophils Absolute: 0.1 10*3/uL (ref 0.0–0.5)
Eosinophils Relative: 3 %
HCT: 41.9 % (ref 39.0–52.0)
Hemoglobin: 13.5 g/dL (ref 13.0–17.0)
Immature Granulocytes: 1 %
Lymphocytes Relative: 23 %
Lymphs Abs: 0.9 10*3/uL (ref 0.7–4.0)
MCH: 30.3 pg (ref 26.0–34.0)
MCHC: 32.2 g/dL (ref 30.0–36.0)
MCV: 94.2 fL (ref 80.0–100.0)
Monocytes Absolute: 0.6 10*3/uL (ref 0.1–1.0)
Monocytes Relative: 16 %
Neutro Abs: 2.3 10*3/uL (ref 1.7–7.7)
Neutrophils Relative %: 56 %
Platelet Count: 245 10*3/uL (ref 150–400)
RBC: 4.45 MIL/uL (ref 4.22–5.81)
RDW: 14.3 % (ref 11.5–15.5)
WBC Count: 3.9 10*3/uL — ABNORMAL LOW (ref 4.0–10.5)
nRBC: 0 % (ref 0.0–0.2)

## 2019-06-04 LAB — CMP (CANCER CENTER ONLY)
ALT: 19 U/L (ref 0–44)
AST: 21 U/L (ref 15–41)
Albumin: 4.3 g/dL (ref 3.5–5.0)
Alkaline Phosphatase: 57 U/L (ref 38–126)
Anion gap: 7 (ref 5–15)
BUN: 28 mg/dL — ABNORMAL HIGH (ref 8–23)
CO2: 28 mmol/L (ref 22–32)
Calcium: 9.4 mg/dL (ref 8.9–10.3)
Chloride: 104 mmol/L (ref 98–111)
Creatinine: 1 mg/dL (ref 0.61–1.24)
GFR, Est AFR Am: >60 mL/min (ref >60)
GFR, Estimated: >60 mL/min (ref >60)
Glucose, Bld: 94 mg/dL (ref 70–99)
Potassium: 4.3 mmol/L (ref 3.5–5.1)
Sodium: 139 mmol/L (ref 135–145)
Total Bilirubin: 0.5 mg/dL (ref 0.3–1.2)
Total Protein: 7.2 g/dL (ref 6.5–8.1)

## 2019-06-04 MED ORDER — IOHEXOL 300 MG/ML  SOLN
100.0000 mL | Freq: Once | INTRAMUSCULAR | Status: AC | PRN
  Administered 2019-06-04: 100 mL via INTRAVENOUS

## 2019-06-04 MED ORDER — SODIUM CHLORIDE (PF) 0.9 % IJ SOLN
INTRAMUSCULAR | Status: AC
  Filled 2019-06-04: qty 50

## 2019-06-04 MED ORDER — SODIUM CHLORIDE 0.9 % IV SOLN
INTRAVENOUS | Status: AC
  Filled 2019-06-04: qty 250

## 2019-06-11 ENCOUNTER — Other Ambulatory Visit: Payer: Self-pay

## 2019-06-11 ENCOUNTER — Inpatient Hospital Stay: Payer: Medicare Other | Admitting: Oncology

## 2019-06-11 VITALS — BP 123/78 | HR 79 | Temp 98.3°F | Resp 18 | Ht 70.0 in | Wt 161.0 lb

## 2019-06-11 DIAGNOSIS — C679 Malignant neoplasm of bladder, unspecified: Secondary | ICD-10-CM

## 2019-06-11 NOTE — Progress Notes (Signed)
Hematology and Oncology Follow Up Visit  Douglas Wood [id number] [birth year] 86 y.o. 06/11/2019 9:56 AM Douglas Wood, MDShaw, Douglas May, MD   Principle Diagnosis: 86 year old man with bladder cancer diagnosed in 2018.  He presented with T2 N0 high-grade urothelial carcinoma.   Prior Therapy:  He S/P TURBT on April 14, 2017 under the care of Dr. Karsten Ro for a 5.5 cm tumor involving the right wall of the bladder.   Definitive therapy with radiation therapy and weekly carboplatin concluded in April 2019.   Current therapy: Active surveillance.  Interim History: Douglas Wood returns today for a follow-up visit.  Since the last visit, he reports no major changes in his health.  He denies any abdominal pain nausea or flank pain.  He denies any hematuria or dysuria.  He remains active and continues to attempt activities of daily living.  Quality of life remains unchanged.       Medications: Updated on review. Current Outpatient Medications  Medication Sig Dispense Refill  . acetaminophen (TYLENOL) 325 MG tablet Take 650 mg by mouth every 6 (six) hours as needed for mild pain, moderate pain or headache.    . diazepam (VALIUM) 5 MG tablet Take 5 mg by mouth at bedtime as needed for anxiety.     Marland Kitchen HYDROcodone-acetaminophen (NORCO) 10-325 MG tablet Take 1-2 tablets by mouth every 4 (four) hours as needed for moderate pain. Maximum dose per 24 hours - 8 pills 10 tablet 0  . omeprazole (PRILOSEC) 20 MG capsule Take 20 mg by mouth every evening.     . phenazopyridine (PYRIDIUM) 200 MG tablet Take 1 tablet (200 mg total) by mouth 3 (three) times daily as needed for pain. 20 tablet 0  . pramoxine-hydrocortisone (PROCTOCREAM-HC) 1-1 % rectal cream Place 1 application rectally 2 (two) times daily. (Patient taking differently: Place 1 application rectally 2 (two) times daily as needed for hemorrhoids or anal itching. ) 30 g 1  . silodosin (RAPAFLO) 8 MG CAPS capsule Take 8 mg by mouth every  evening.    . simvastatin (ZOCOR) 10 MG tablet Take 10 mg by mouth every evening.  0   No current facility-administered medications for this visit.     Allergies:  Allergies  Allergen Reactions  . Oxycodone-Acetaminophen Other (See Comments)    Causes vertigo  . Levaquin [Levofloxacin In D5w] Other (See Comments)    Joint pain  . Ezetimibe Other (See Comments)    Muscle aches/pains        Physical Exam:  Blood pressure 123/78, pulse 79, temperature 98.3 F (36.8 C), temperature source Temporal, resp. rate 18, height 5\' 10"  (1.778 m), weight 161 lb (73 kg), SpO2 96 %.     ECOG: 0   General appearance: Comfortable appearing without any discomfort Head: Normocephalic without any trauma Oropharynx: Mucous membranes are moist and pink without any thrush or ulcers. Eyes: Pupils are equal and round reactive to light. Lymph nodes: No cervical, supraclavicular, inguinal or axillary lymphadenopathy.   Heart:regular rate and rhythm.  S1 and S2 without leg edema. Lung: Clear without any rhonchi or wheezes.  No dullness to percussion. Abdomin: Soft, nontender, nondistended with good bowel sounds.  No hepatosplenomegaly. Musculoskeletal: No joint deformity or effusion.  Full range of motion noted. Neurological: No deficits noted on motor, sensory and deep tendon reflex exam. Skin: No petechial rash or dryness.  Appeared moist.         Lab Results: Lab Results  Component Value Date   WBC 3.9 (L)  06/04/2019   HGB 13.5 06/04/2019   HCT 41.9 06/04/2019   MCV 94.2 06/04/2019   PLT 245 06/04/2019     Chemistry      Component Value Date/Time   NA 139 06/04/2019 0909   K 4.3 06/04/2019 0909   CL 104 06/04/2019 0909   CO2 28 06/04/2019 0909   BUN 28 (H) 06/04/2019 0909   CREATININE 1.00 06/04/2019 0909      Component Value Date/Time   CALCIUM 9.4 06/04/2019 0909   ALKPHOS 57 06/04/2019 0909   AST 21 06/04/2019 0909   ALT 19 06/04/2019 0909   BILITOT 0.5 06/04/2019  0909     IMPRESSION: 1. Status post left nephro ureterectomy. There is a small polypoid filling defect within the left anterolateral bladder base which is new from previous exam. Suspicious for new urothelial lesion. Consider further evaluation with direct visualization. 2. No findings of nodal metastasis or distant metastatic disease. 3. Enlarged prostate gland with mass effect upon the bladder base. 4. Aortic atherosclerosis and 3 vessel coronary artery calcifications. 5. Stable ascending thoracic aortic aneurysm. Recommend annual imaging followup by CTA or MRA. This recommendation follows 2010 ACCF/AHA/AATS/ACR/ASA/SCA/SCAI/SIR/STS/SVM Guidelines for the Diagnosis and Management of Patients with Thoracic Aortic Disease. Circulation. 2010; [id number]. Aortic aneurysm NOS (ICD10-I71.9)    Impression and Plan:  86 year old man with:   1.    Bladder cancer diagnosed in December 2018.  He was found to have T2 N0 disease.  He completed therapy in April 2019.  He remains on active surveillance at this time without any clear current disease relapse.  CT scan obtained on 06/04/2019 was personally reviewed and discussed with the patient.  He has no evidence of disease outside of the bladder but he does have a filling defect that could be suspicious for disease relapse.  I recommended repeating cystoscopy which she has been getting under the care of Dr. Karsten Ro.  He is scheduled for follow-up and office cystoscopy today.  If he has a no muscle invasive disease I would recommend repeat imaging studies in 6 months and annually after that complete 5 years.  Clinically he has a recurrence of the muscle invasive disease then a different approach Wood be needed.  2.  Leukocytopenia: Remains stable with a normal differential at this time.  3.  Anemia: Normalized at this time after the effective treatment have resolved.  This is likely related to malignancy and chemotherapy exposure.  4.   Follow-up: In 6 months for repeat evaluation after repeat imaging studies.  30  minutes was dedicated to this visit today.  The time was spent on reviewing his imaging studies, laboratory data, disease status update and answering questions regarding future plan of care.     Zola Button, MD 2/16/20219:56 AM

## 2019-06-12 ENCOUNTER — Telehealth: Payer: Self-pay | Admitting: Oncology

## 2019-06-12 NOTE — Telephone Encounter (Signed)
Scheduled appt per 2/16 los.  Was not able to get in contact with the pt, sent a message to HIM Pool to get a calendar mailed out, with central radiology number attached to it.

## 2019-06-19 ENCOUNTER — Other Ambulatory Visit: Payer: Self-pay | Admitting: Urology

## 2019-07-09 ENCOUNTER — Encounter (HOSPITAL_BASED_OUTPATIENT_CLINIC_OR_DEPARTMENT_OTHER): Payer: Self-pay | Admitting: Urology

## 2019-07-09 ENCOUNTER — Other Ambulatory Visit: Payer: Self-pay

## 2019-07-09 NOTE — Progress Notes (Signed)
Spoke w/ via phone for pre-op interview---[facility] needs dos----none               COVID test ------07-11-2019 at 850 am Arrive at -------630 am 07-15-2019 NPO after ------midnight Medications to take morning of surgery -----doxycycline if still taking Diabetic medication -----n/a Patient Special Instructions ----- Pre-Op special Istructions ----- Patient verbalized understanding of instructions that were given at this phone interview. Patient denies shortness of breath, chest pain, fever, cough a this phone interview.

## 2019-07-11 ENCOUNTER — Other Ambulatory Visit (HOSPITAL_COMMUNITY)
Admission: RE | Admit: 2019-07-11 | Discharge: 2019-07-11 | Disposition: A | Discharge status: Home / Self Care | Payer: Medicare Other | Source: Ambulatory Visit | Attending: Urology | Admitting: Urology

## 2019-07-11 DIAGNOSIS — Z01812 Encounter for preprocedural laboratory examination: Secondary | ICD-10-CM | POA: Diagnosis present

## 2019-07-11 DIAGNOSIS — Z20822 Contact with and (suspected) exposure to covid-19: Secondary | ICD-10-CM | POA: Diagnosis not present

## 2019-07-11 LAB — SARS CORONAVIRUS 2 (TAT 6-24 HRS): SARS Coronavirus 2: NEGATIVE

## 2019-07-12 NOTE — Anesthesia Preprocedure Evaluation (Addendum)
Anesthesia Evaluation  Patient identified by MRN, date of birth, ID band Patient awake    Reviewed: Allergy & Precautions, NPO status , Patient's Chart, lab work & pertinent test results  History of Anesthesia Complications Negative for: history of anesthetic complications  Airway Mallampati: II  TM Distance: >3 FB Neck ROM: Full    Dental no notable dental hx.    Pulmonary former smoker,    Pulmonary exam normal        Cardiovascular negative cardio ROS Normal cardiovascular exam     Neuro/Psych negative neurological ROS  negative psych ROS   GI/Hepatic Neg liver ROS, GERD  Medicated and Controlled,  Endo/Other  negative endocrine ROS  Renal/GU Solitary kidney   Bladder ca    Musculoskeletal  (+) Arthritis ,   Abdominal   Peds  Hematology negative hematology ROS (+)   Anesthesia Other Findings Day of surgery medications reviewed with patient.  Reproductive/Obstetrics negative OB ROS                            Anesthesia Physical Anesthesia Plan  ASA: II  Anesthesia Plan: General   Post-op Pain Management:    Induction: Intravenous  PONV Risk Score and Plan: 2 and Treatment may vary due to age or medical condition and Ondansetron  Airway Management Planned: LMA  Additional Equipment: None  Intra-op Plan:   Post-operative Plan: Extubation in OR  Informed Consent: I have reviewed the patients History and Physical, chart, labs and discussed the procedure including the risks, benefits and alternatives for the proposed anesthesia with the patient or authorized representative who has indicated his/her understanding and acceptance.     Dental advisory given  Plan Discussed with: CRNA  Anesthesia Plan Comments:        Anesthesia Quick Evaluation

## 2019-07-12 NOTE — H&P (Signed)
HPI: Douglas Wood is a 86 year-old male with recurrent bladder cancer.  His bladder cancer was not superficial and limitied to the bladder lining. His bladder cancer was muscle invasive.   He did have a TURBT. His last bladder tumor was resected 12/07/2018. He has had the following number of bladder resections: 3. He did have radiation to treat his bladder cancer. His radiation treatment was complete 08/17/2017.   He has not had blood in his urine recently. The patient has developed urgency. He is not having new bone pain. He has not recently had unwanted weight loss.   His last cysto was 12/07/2018.   This condition would be considered of mild to moderate severity with no modifying factors or associated signs or symptoms other than as noted above.   12/17/18: After experiencing gross hematuria he was found to have recurrent tumor on the posterior wall of his bladder and underwent TURBT on 12/07/18. I resected the tumor from the posterior wall of the bladder as well as some abnormal appearing mucosa near the bladder neck at the 8 o'clock position. He underwent postoperative gemcitabine installation. He was unable to void postoperatively. A catheter was left indwelling and he removed it at home and has voided without difficulty since. A dysuria but he is controlled azo Standard.   06/11/19: He has returned today for follow-up of muscle invasive bladder cancer treated with sensitized thing chemotherapy and radiation. He has developed some dysuria. He finds it seems to be worsened by alcohol intake. He just underwent a CT scan and no evidence extravesical disease or metastases were identified.     ALLERGIES: Levaquin TABS    MEDICATIONS: Doxycycline Hyclate 100 mg capsule 1 capsule PO Q 12 H  Oxybutynin Chloride 5 mg tablet 1 tablet PO q 6-8 hrs prn urinary urgency/frequency/burning  Rapaflo 8 mg capsule take 1 capsule by mouth at bedtime  Silodosin 8 mg capsule TAKE 1 CAPSULE BY MOUTH AT BEDTIME   Simvastatin 20 mg tablet  Diazepam PRN  Losartan Potassium 25 mg tablet     GU PSH: Bladder Instill AntiCA Agent - 12/07/2018 Cystoscopy - 03/11/2019, 11/15/2018, 06/27/2018, 12/27/2017, 03/08/2017 Cystoscopy Insert Stent - 2009 Cystoscopy TURBT >5 cm - 04/14/2017 Cystoscopy TURBT 2-5 cm - 12/07/2018, 2019 Locm 300-399Mg /Ml Iodine,1Ml - 03/07/2017 Ureteroscopic stone removal - 2009       PSH Notes: Cystoscopy With Ureteroscopy With Removal Of Calculus, Cystoscopy With Insertion Of Ureteral Stent Right, Dental Surgery, Hemorrhoidectomy   NON-GU PSH: Hemorrhoidectomy (favorite) - 2008     GU PMH: History of bladder cancer (Stable), He will return in 3 months for repeat surveillance cystoscopy. - 03/11/2019, (Stable), He has a history of bladder cancer but had very few red cells today. He is due for surveillance cystoscopy next month., - 02/05/2019 (Stable), - 12/17/2018 (Stable), I am also going to plan to biopsy the right wall of his bladder., - 11/15/2018, - 11/07/2018 (Stable), We discussed the fact that I did not believe his hematuria had anything to do with his history of bladder cancer since he had upper tract evaluation and cystoscopy both recently with no abnormalities noted., - 07/16/2018 (Stable), He had no evidence of recurrent transitional cell carcinoma the bladder. I did see radiation changes that were more prominent toward the dome and anterior wall. No papillary lesions were identified. Urine will be sent for cytology today and I will continue to monitor him with cystoscopy again in 6 months., - 06/27/2018, - 03/08/2018 (Stable), He had no evidence of  recurrent transitional cell carcinoma the bladder today. Scheduled for a CT scan in 1/20. I will see him back in 6 months for repeat surveillance cystoscopy, - 12/27/2017, 2017/08/14 Gross hematuria - 11/07/2018, (Stable), His gross hematuria is secondary to the large bladder tumor identified within his bladder today., - 03/08/2017, -  03/02/2017 Dysuria (Stable), He is experiencing some dysuria, some frequency and some urgency which may be secondary to some radiation cystitis. I will rule out a UTI. - 06/27/2018, 08-14-2017 Bladder Cancer overlapping sites, He has elected to proceed with chemo radiation. As such I am going to schedule him for a repeat resection to try to debulk as much tumor as possible in 4 weeks. In the interim I am going to schedule him for an appointment with Dr. Alen Blew. 08-14-17 Bladder tumor/neoplasm, He has a large bladder tumor that in fact measures greater than 5 cm cystoscopically. It involves the right floor of his bladder and could possibly also involve his right ureteral orifice. Because of that I told him he would be at a slight risk because of having only 1 kidney and I will therefore need to monitor him for hydronephrosis if his ureteral orifice is involved and requires resection. - 03/08/2017 Chronic prostatitis, Prostatitis, chronic - Aug 15, 2015 BPH w/LUTS, Benign prostatic hyperplasia with urinary obstruction - August 14, 2012 Encounter for Prostate Cancer screening, Prostate cancer screening - 2012-08-14 Inflammatory Disease Prostate, Unspec, Prostatitis - 14-Aug-2012 Renal calculus, Kidney stone on right side - August 14, 2012 Testicular atrophy, Testicular atrophy - 14-Aug-2012 Ureteral calculus, Ureteral Stone Aug 14, 2012      PMH Notes: BPH with outlet obstruction: This has been present for a number of years.  Current therapy: Rapaflo   Chronic prostatitis: He has had difficulty with this over the years. It has responded to antibiotic therapy.   Nephrolithiasis: He underwent a CT scan in 12/09 which revealed a 2 mm stone in the right kidney. He also passed a stone at that time. His stone analysis revealed calcium oxalate.   Solitary right kidney: He has congenital absence of the left kidney.   Bladder cancer: After experiencing gross hematuria he was evaluated with no abnormality of the upper tracts by CT scan the large tumor was identified on  the floor the bladder on the right-hand side.  TURBT 04/14/17: I resected a 5.5 cm tumor from the floor of the bladder on the right hand side.  Pathology: Invasive high-grade transitional cell carcinoma.  Stage/grade: T2a, G3  Repeat TURBT 05/22/17: All visible remaining tumor resected.  Treatment: Chemoradiation completed 08/17/17.  TURBT 12/07/18: Tumor resected from the posterior wall and bladder neck.  Pathology: Superficial, high-grade TCCa (Ta, G3)   NON-GU PMH: Pyuria/other UA findings (Stable), He did have some pyuria and bacteriuria today. In addition he said the azo Standard causes significant staining so I am going to send in a prescription for Uribel since the azo does control his dysuria nicely. - 02/05/2019, (Stable), - 11/15/2018, His urine did have white blood cells but only a few bacteria. Because he is having some dysuria I am going to culture his urine., - 06/27/2018 Encounter for general adult medical examination without abnormal findings, Encounter for preventive health examination - 08-15-2015 Renal agenesis, unspecified, Congenital absence of kidney - 14-Aug-2012 Sebaceous cyst, Sebaceous cyst - Aug 14, 2012    FAMILY HISTORY: Death In The Family Father - Runs In Family Death In The Family Mother - Runs In Family   SOCIAL HISTORY: Marital Status: Married Preferred Language: English; Ethnicity: Not Hispanic  Or Latino; Race: White Current Smoking Status: Patient does not smoke anymore. Has not smoked since 02/24/1967.   Tobacco Use Assessment Completed: Used Tobacco in last 30 days? Does drink.  Drinks 1 caffeinated drink per day. Patient's occupation is/was retired.     Notes: Former smoker, Occupation:, Alcohol Use, Caffeine Use, Marital History - Currently Married   REVIEW OF SYSTEMS:    GU Review Male:   Patient denies frequent urination, hard to postpone urination, burning/ pain with urination, get up at night to urinate, leakage of urine, stream starts and stops, trouble starting your  stream, have to strain to urinate , erection problems, and penile pain.  Gastrointestinal (Upper):   Patient denies nausea, vomiting, and indigestion/ heartburn.  Gastrointestinal (Lower):   Patient denies diarrhea and constipation.  Constitutional:   Patient denies fever, night sweats, weight loss, and fatigue.  Skin:   Patient denies skin rash/ lesion and itching.  Eyes:   Patient denies blurred vision and double vision.  Ears/ Nose/ Throat:   Patient denies sore throat and sinus problems.  Hematologic/Lymphatic:   Patient denies swollen glands and easy bruising.  Cardiovascular:   Patient denies leg swelling and chest pains.  Respiratory:   Patient denies cough and shortness of breath.  Endocrine:   Patient denies excessive thirst.  Musculoskeletal:   Patient denies back pain and joint pain.  Neurological:   Patient denies headaches and dizziness.  Psychologic:   Patient denies depression and anxiety.   VITAL SIGNS:    Weight 150 lb / 68.04 kg  Height 70 in / 177.8 cm  BP 123/78 mmHg  BMI 21.5 kg/m   GU PHYSICAL EXAMINATION:    Urethral Meatus: Normal size. No lesion, no wart, no discharge, no polyp. Normal location.  Penis: Circumcised, no warts, no cracks. No dorsal Peyronie's plaques, no left corporal Peyronie's plaques, no right corporal Peyronie's plaques, no scarring, no warts. No balanitis, no meatal stenosis.   MULTI-SYSTEM PHYSICAL EXAMINATION:    Constitutional: Well-nourished. No physical deformities. Normally developed. Good grooming.  Respiratory: No labored breathing, no use of accessory muscles.   Cardiovascular: Normal temperature, no swelling, no varicosities.   Skin: No paleness, no jaundice, no cyanosis. No lesion, no ulcer, no rash.  Neurologic / Psychiatric: Oriented to time, oriented to place, oriented to person. No depression, no anxiety, no agitation.  Gastrointestinal: No mass, no tenderness, no rigidity, non obese abdomen.  Musculoskeletal: Spine, ribs,  pelvis no bilateral tenderness. Normal gait and station of head and neck.        PAST DATA REVIEWED:  Source Of History:  Patient  Records Review:   Previous Patient Records, POC Tool   06/15/11 05/31/10 03/17/08 04/17/07 03/14/07 02/14/06 02/16/05 02/20/04  PSA  Total PSA 1.28  1.48  2.55  1.97  3.78  1.16  1.19  0.84     PROCEDURES:         Flexible Cystoscopy - done on 06/11/19  Risks, benefits, and some of the potential complications were discussed. Sterile technique and 2% Lidocaine intraurethral analgesia were used.  Meatus:  Normal size. Normal location. Normal condition.  Urethra:  No strictures.  External Sphincter:  Normal.  Verumontanum:  Normal.  Prostate:  Obstructing. Moderate hyperplasia.  Bladder Neck:  Non-obstructing.  Ureteral Orifices:  Normal location. Normal size. Normal shape. Effluxed clear urine.  Bladder:  Severe trabeculation. Cellules. 1 1/2 cm tumor. Solitary tumor. Normal mucosa. No stones. He had fairly significant radiation changes to the bladder as well.  The lower urinary tract was carefully examined. The procedure was well-tolerated and without complications. Instructions were given to call the office immediately for bloody urine, difficulty urinating, urinary retention, painful or frequent urination, fever or other illness. The patient stated that he understood these instructions and would comply with them.         Urinalysis w/Scope Dipstick Dipstick Cont'd Micro  Color: Yellow Bilirubin: Neg mg/dL WBC/hpf: 0 - 5/hpf  Appearance: Slightly Cloudy Ketones: Neg mg/dL RBC/hpf: 20 - 40/hpf  Specific Gravity: 1.015 Blood: 3+ ery/uL Bacteria: Rare (0-9/hpf)  pH: <=5.0 Protein: Neg mg/dL Cystals: NS (Not Seen)  Glucose: Neg mg/dL Urobilinogen: 0.2 mg/dL Casts: NS (Not Seen)    Nitrites: Neg Trichomonas: Not Present    Leukocyte Esterase: Neg leu/uL Mucous: Not Present      Epithelial Cells: NS (Not Seen)      Yeast: NS (Not Seen)      Sperm: Not  Present    ASSESSMENT/PLAN:      ICD-10 Details  1 GU:   Dysuria - R30.0 Chronic, Stable - His dysuria is almost certainly secondary to his radiation cystitis. It seems to be worsened by alcohol. He has tried [employer] without improvement.  2   Radiation cystitis (w/o hematuria) - N30.40 Chronic, Stable - I do note a lot telangiectasia of the walls of the bladder consistent with the effects of radiation. Fortunately he has not had any difficulty with gross hematuria  3   Bladder Cancer Dome - C67.1 Undiagnosed New Problem - He had a recurrence at the dome of his bladder. I did not find any other lesions within the bladder. We discussed proceeding with transurethral resection of the lesion.

## 2019-07-15 ENCOUNTER — Ambulatory Visit (HOSPITAL_BASED_OUTPATIENT_CLINIC_OR_DEPARTMENT_OTHER): Payer: Medicare Other | Admitting: Anesthesiology

## 2019-07-15 ENCOUNTER — Ambulatory Visit (HOSPITAL_BASED_OUTPATIENT_CLINIC_OR_DEPARTMENT_OTHER)
Admission: RE | Admit: 2019-07-15 | Discharge: 2019-07-15 | Disposition: A | Discharge status: Home / Self Care | Payer: Medicare Other | Attending: Urology | Admitting: Urology

## 2019-07-15 ENCOUNTER — Encounter (HOSPITAL_BASED_OUTPATIENT_CLINIC_OR_DEPARTMENT_OTHER)
Admission: RE | Disposition: A | Discharge status: Home / Self Care | Payer: Self-pay | Source: Home / Self Care | Attending: Urology

## 2019-07-15 ENCOUNTER — Encounter (HOSPITAL_BASED_OUTPATIENT_CLINIC_OR_DEPARTMENT_OTHER): Payer: Self-pay | Admitting: Urology

## 2019-07-15 DIAGNOSIS — R300 Dysuria: Secondary | ICD-10-CM | POA: Diagnosis not present

## 2019-07-15 DIAGNOSIS — C673 Malignant neoplasm of anterior wall of bladder: Secondary | ICD-10-CM | POA: Diagnosis not present

## 2019-07-15 DIAGNOSIS — C680 Malignant neoplasm of urethra: Secondary | ICD-10-CM | POA: Diagnosis not present

## 2019-07-15 DIAGNOSIS — K219 Gastro-esophageal reflux disease without esophagitis: Secondary | ICD-10-CM | POA: Insufficient documentation

## 2019-07-15 DIAGNOSIS — Z87891 Personal history of nicotine dependence: Secondary | ICD-10-CM | POA: Diagnosis not present

## 2019-07-15 DIAGNOSIS — N3040 Irradiation cystitis without hematuria: Secondary | ICD-10-CM | POA: Diagnosis not present

## 2019-07-15 DIAGNOSIS — C678 Malignant neoplasm of overlapping sites of bladder: Secondary | ICD-10-CM

## 2019-07-15 DIAGNOSIS — Z79899 Other long term (current) drug therapy: Secondary | ICD-10-CM | POA: Diagnosis not present

## 2019-07-15 HISTORY — PX: TRANSURETHRAL RESECTION OF BLADDER TUMOR: SHX2575

## 2019-07-15 HISTORY — DX: Other specified disorders of bone density and structure, unspecified site: M85.80

## 2019-07-15 SURGERY — TURBT (TRANSURETHRAL RESECTION OF BLADDER TUMOR)
Anesthesia: General | Site: Bladder

## 2019-07-15 MED ORDER — ACETAMINOPHEN 500 MG PO TABS
1000.0000 mg | ORAL_TABLET | Freq: Once | ORAL | Status: AC
  Administered 2019-07-15: 07:00:00 1000 mg via ORAL
  Filled 2019-07-15: qty 2

## 2019-07-15 MED ORDER — PROMETHAZINE HCL 25 MG/ML IJ SOLN
6.2500 mg | INTRAMUSCULAR | Status: DC | PRN
  Filled 2019-07-15: qty 1

## 2019-07-15 MED ORDER — FENTANYL CITRATE (PF) 100 MCG/2ML IJ SOLN
INTRAMUSCULAR | Status: AC
  Filled 2019-07-15: qty 2

## 2019-07-15 MED ORDER — PROPOFOL 10 MG/ML IV BOLUS
INTRAVENOUS | Status: AC
  Filled 2019-07-15: qty 20

## 2019-07-15 MED ORDER — GEMCITABINE CHEMO FOR BLADDER INSTILLATION 2000 MG
2000.0000 mg | Freq: Once | INTRAVENOUS | Status: AC
  Administered 2019-07-15: 10:00:00 2000 mg via INTRAVESICAL
  Filled 2019-07-15: qty 2000

## 2019-07-15 MED ORDER — FENTANYL CITRATE (PF) 100 MCG/2ML IJ SOLN
INTRAMUSCULAR | Status: DC | PRN
  Administered 2019-07-15: 25 ug via INTRAVENOUS
  Administered 2019-07-15: 50 ug via INTRAVENOUS

## 2019-07-15 MED ORDER — URIBEL 118 MG PO CAPS: 1.0000 | ORAL_CAPSULE | Freq: Four times a day (QID) | ORAL | 11 refills | Status: DC | PRN

## 2019-07-15 MED ORDER — PHENYLEPHRINE 40 MCG/ML (10ML) SYRINGE FOR IV PUSH (FOR BLOOD PRESSURE SUPPORT)
PREFILLED_SYRINGE | INTRAVENOUS | Status: AC
  Filled 2019-07-15: qty 10

## 2019-07-15 MED ORDER — ACETAMINOPHEN 500 MG PO TABS
ORAL_TABLET | ORAL | Status: AC
  Filled 2019-07-15: qty 2

## 2019-07-15 MED ORDER — EPHEDRINE SULFATE-NACL 50-0.9 MG/10ML-% IV SOSY
PREFILLED_SYRINGE | INTRAVENOUS | Status: DC | PRN
  Administered 2019-07-15 (×2): 10 mg via INTRAVENOUS

## 2019-07-15 MED ORDER — CIPROFLOXACIN IN D5W 400 MG/200ML IV SOLN
400.0000 mg | Freq: Once | INTRAVENOUS | Status: AC
  Administered 2019-07-15: 400 mg via INTRAVENOUS
  Filled 2019-07-15: qty 200

## 2019-07-15 MED ORDER — ONDANSETRON HCL 4 MG/2ML IJ SOLN
INTRAMUSCULAR | Status: DC | PRN
  Administered 2019-07-15: 4 mg via INTRAVENOUS

## 2019-07-15 MED ORDER — LIDOCAINE 2% (20 MG/ML) 5 ML SYRINGE
INTRAMUSCULAR | Status: DC | PRN
  Administered 2019-07-15: 80 mg via INTRAVENOUS

## 2019-07-15 MED ORDER — PROPOFOL 10 MG/ML IV BOLUS
INTRAVENOUS | Status: DC | PRN
  Administered 2019-07-15: 130 mg via INTRAVENOUS
  Administered 2019-07-15: 20 mg via INTRAVENOUS

## 2019-07-15 MED ORDER — LIDOCAINE 2% (20 MG/ML) 5 ML SYRINGE
INTRAMUSCULAR | Status: AC
  Filled 2019-07-15: qty 5

## 2019-07-15 MED ORDER — SODIUM CHLORIDE 0.9 % IR SOLN
Status: DC | PRN
  Administered 2019-07-15: 12000 mL via INTRAVESICAL

## 2019-07-15 MED ORDER — ONDANSETRON HCL 4 MG/2ML IJ SOLN
INTRAMUSCULAR | Status: AC
  Filled 2019-07-15: qty 2

## 2019-07-15 MED ORDER — CIPROFLOXACIN IN D5W 400 MG/200ML IV SOLN
INTRAVENOUS | Status: AC
  Filled 2019-07-15: qty 200

## 2019-07-15 MED ORDER — SODIUM CHLORIDE 0.9 % IV SOLN
INTRAVENOUS | Status: DC
  Administered 2019-07-15: 1000 mL via INTRAVENOUS
  Filled 2019-07-15: qty 1000

## 2019-07-15 MED ORDER — FENTANYL CITRATE (PF) 100 MCG/2ML IJ SOLN
25.0000 ug | INTRAMUSCULAR | Status: DC | PRN
  Administered 2019-07-15 (×4): 25 ug via INTRAVENOUS
  Filled 2019-07-15: qty 1

## 2019-07-15 SURGICAL SUPPLY — 32 items
BAG DRAIN URO-CYSTO SKYTR STRL (DRAIN) ×2 IMPLANT
BAG DRN RND TRDRP ANRFLXCHMBR (UROLOGICAL SUPPLIES) ×1
BAG DRN UROCATH (DRAIN) ×1
BAG URINE DRAIN 2000ML AR STRL (UROLOGICAL SUPPLIES) ×1 IMPLANT
BAG URINE LEG 500ML (DRAIN) IMPLANT
CATH FOLEY 2WAY SLVR  5CC 18FR (CATHETERS) ×2
CATH FOLEY 2WAY SLVR  5CC 20FR (CATHETERS)
CATH FOLEY 2WAY SLVR  5CC 22FR (CATHETERS)
CATH FOLEY 2WAY SLVR  5CC 24FR (CATHETERS) ×2
CATH FOLEY 2WAY SLVR 5CC 18FR (CATHETERS) IMPLANT
CATH FOLEY 2WAY SLVR 5CC 20FR (CATHETERS) IMPLANT
CATH FOLEY 2WAY SLVR 5CC 22FR (CATHETERS) IMPLANT
CATH FOLEY 2WAY SLVR 5CC 24FR (CATHETERS) ×1 IMPLANT
CATH FOLEY 3WAY 20FR (CATHETERS) IMPLANT
CLOTH BEACON ORANGE TIMEOUT ST (SAFETY) ×2 IMPLANT
ELECT REM PT RETURN 9FT ADLT (ELECTROSURGICAL) ×2
ELECTRODE REM PT RTRN 9FT ADLT (ELECTROSURGICAL) ×1 IMPLANT
EVACUATOR MICROVAS BLADDER (UROLOGICAL SUPPLIES) ×1 IMPLANT
GLOVE BIO SURGEON STRL SZ8 (GLOVE) ×3 IMPLANT
GOWN STRL REUS W/ TWL XL LVL3 (GOWN DISPOSABLE) ×1 IMPLANT
GOWN STRL REUS W/TWL XL LVL3 (GOWN DISPOSABLE) ×4
HOLDER FOLEY CATH W/STRAP (MISCELLANEOUS) IMPLANT
IV NS IRRIG 3000ML ARTHROMATIC (IV SOLUTION) ×5 IMPLANT
KIT TURNOVER CYSTO (KITS) ×2 IMPLANT
LOOP CUT BIPOLAR 24F LRG (ELECTROSURGICAL) ×1 IMPLANT
MANIFOLD NEPTUNE II (INSTRUMENTS) ×2 IMPLANT
PACK CYSTO (CUSTOM PROCEDURE TRAY) ×2 IMPLANT
PLUG CATH AND CAP STER (CATHETERS) IMPLANT
TUBE CONNECTING 12X1/4 (SUCTIONS) ×2 IMPLANT
TUBING UROLOGY SET (TUBING) IMPLANT
WATER STERILE IRR 3000ML UROMA (IV SOLUTION) IMPLANT
WATER STERILE IRR 500ML POUR (IV SOLUTION) ×1 IMPLANT

## 2019-07-15 NOTE — Discharge Instructions (Signed)
Transurethral Resection of Bladder Tumor (TURBT)  Definition:  Transurethral Resection of the Bladder Tumor is a surgical procedure used to diagnose and remove tumors within the bladder. TURBT is the most common treatment for early stage bladder cancer.  General instructions:     Your recent bladder surgery requires very little post hospital care but some definite precautions.  Despite the fact that no skin incisions were used, the area around the bladder incisions are raw and covered with scabs to promote healing and prevent bleeding. Certain precautions are needed to insure that the scabs are not disturbed over the next 2-4 weeks while the healing proceeds.  Because the raw surface inside your bladder and the irritating effects of urine you may expect frequency of urination and/or urgency (a stronger desire to urinate) and perhaps even getting up at night more often. This will usually resolve or improve slowly over the healing period. You may see some blood in your urine over the first 6 weeks. Do not be alarmed, even if the urine was clear for a while. Get off your feet and drink lots of fluids until clearing occurs. If you start to pass clots or don't improve call us.  Catheter: (If you are discharged with a catheter.)  1. Keep your catheter secured to your leg at all times with tape or the supplied strap. 2. You may experience leakage of urine around your catheter- as long as the  catheter continues to drain, this is normal.  If your catheter stops draining  go to the ER. 3. You may also have blood in your urine, even after it has been clear for  several days; you may even pass some small blood clots or other material.  This  is normal as well.  If this happens, sit down and drink plenty of water to help  make urine to flush out your bladder.  If the blood in your urine becomes worse  after doing this, contact our office or return to the ER. 4. You may use the leg bag (small bag) during  the day, but use the large bag at  night.  Diet:  You may return to your normal diet immediately. Because of the raw surface of your bladder, alcohol, spicy foods, foods high in acid and drinks with caffeine may cause irritation or frequency and should be used in moderation. To keep your urine flowing freely and avoid constipation, drink plenty of fluids during the day (8-10 glasses). Tip: Avoid cranberry juice because it is very acidic.  Activity:  Your physical activity doesn't need to be restricted. However, if you are very active, you may see some blood in the urine. We suggest that you reduce your activity under the circumstances until the bleeding has stopped.  Bowels:  It is important to keep your bowels regular during the postoperative period. Straining with bowel movements can cause bleeding. A bowel movement every other day is reasonable. Use a mild laxative if needed, such as milk of magnesia 2-3 tablespoons, or 2 Dulcolax tablets. Call if you continue to have problems. If you had been taking narcotics for pain, before, during or after your surgery, you may be constipated. Take a laxative if necessary.    Medication:  You should resume your pre-surgery medications unless told not to. In addition you may be given an antibiotic to prevent or treat infection. Antibiotics are not always necessary. All medication should be taken as prescribed until the bottles are finished unless you are having an  unusual reaction to one of the drugs.     Post Anesthesia Home Care Instructions  Activity: Get plenty of rest for the remainder of the day. A responsible individual must stay with you for 24 hours following the procedure.  For the next 24 hours, DO NOT: -Drive a car -Paediatric nurse -Drink alcoholic beverages -Take any medication unless instructed by your physician -Make any legal decisions or sign important papers.  Meals: Start with liquid foods such as gelatin or soup.  Progress to regular foods as tolerated. Avoid greasy, spicy, heavy foods. If nausea and/or vomiting occur, drink only clear liquids until the nausea and/or vomiting subsides. Call your physician if vomiting continues.  Special Instructions/Symptoms: Your throat may feel dry or sore from the anesthesia or the breathing tube placed in your throat during surgery. If this causes discomfort, gargle with warm salt water. The discomfort should disappear within 24 hours.    Indwelling Urinary Catheter Care, Adult An indwelling urinary catheter is a thin tube that is put into your bladder. The tube helps to drain pee (urine) out of your body. The tube goes in through your urethra. Your urethra is where pee comes out of your body. Your pee will come out through the catheter, then it will go into a bag (drainage bag). Take good care of your catheter so it will work well. How to wear your catheter and bag Supplies needed  Sticky tape (adhesive tape) or a leg strap.  Alcohol wipe or soap and water (if you use tape).  A clean towel (if you use tape).  Large overnight bag.  Smaller bag (leg bag). Wearing your catheter Attach your catheter to your leg with tape or a leg strap.  Make sure the catheter is not pulled tight.  If a leg strap gets wet, take it off and put on a dry strap.  If you use tape to hold the bag on your leg: 1. Use an alcohol wipe or soap and water to wash your skin where the tape made it sticky before. 2. Use a clean towel to pat-dry that skin. 3. Use new tape to make the bag stay on your leg. Wearing your bags You should have been given a large overnight bag.  You may wear the overnight bag in the day or night.  Always have the overnight bag lower than your bladder.  Do not let the bag touch the floor.  Before you go to sleep, put a clean plastic bag in a wastebasket. Then hang the overnight bag inside the wastebasket. You should also have a smaller leg bag that fits under  your clothes.  Always wear the leg bag below your knee.  Do not wear your leg bag at night. How to care for your skin and catheter Supplies needed  A clean washcloth.  Water and mild soap.  A clean towel. Caring for your skin and catheter      Clean the skin around your catheter every day: 1. Wash your hands with soap and water. 2. Wet a clean washcloth in warm water and mild soap. 3. Clean the skin around your urethra.  If you are male:  Gently spread the folds of skin around your vagina (labia).  With the washcloth in your other hand, wipe the inner side of your labia on each side. Wipe from front to back.  If you are male:  Pull back any skin that covers the end of your penis (foreskin).  With the washcloth in  your other hand, wipe your penis in small circles. Start wiping at the tip of your penis, then move away from the catheter.  Move the foreskin back in place, if needed. 4. With your free hand, hold the catheter close to where it goes into your body.  Keep holding the catheter during cleaning so it does not get pulled out. 5. With the washcloth in your other hand, clean the catheter.  Only wipe downward on the catheter.  Do not wipe upward toward your body. Doing this may push germs into your urethra and cause infection. 6. Use a clean towel to pat-dry the catheter and the skin around it. Make sure to wipe off all soap. 7. Wash your hands with soap and water.  Shower every day. Do not take baths.  Do not use cream, ointment, or lotion on the area where the catheter goes into your body, unless your doctor tells you to.  Do not use powders, sprays, or lotions on your genital area.  Check your skin around the catheter every day for signs of infection. Check for: ? Redness, swelling, or pain. ? Fluid or blood. ? Warmth. ? Pus or a bad smell. How to empty the bag Supplies needed  Rubbing alcohol.  Gauze pad or cotton ball.  Tape or a leg  strap. Emptying the bag Pour the pee out of your bag when it is ?- full, or at least 2-3 times a day. Do this for your overnight bag and your leg bag. 1. Wash your hands with soap and water. 2. Separate (detach) the bag from your leg. 3. Hold the bag over the toilet or a clean pail. Keep the bag lower than your hips and bladder. This is so the pee (urine) does not go back into the tube. 4. Open the pour spout. It is at the bottom of the bag. 5. Empty the pee into the toilet or pail. Do not let the pour spout touch any surface. 6. Put rubbing alcohol on a gauze pad or cotton ball. 7. Use the gauze pad or cotton ball to clean the pour spout. 8. Close the pour spout. 9. Attach the bag to your leg with tape or a leg strap. 10. Wash your hands with soap and water. Follow instructions for cleaning the drainage bag:  From the product maker.  As told by your doctor. How to change the bag Supplies needed  Alcohol wipes.  A clean bag.  Tape or a leg strap. Changing the bag Replace your bag when it starts to leak, smell bad, or look dirty. 1. Wash your hands with soap and water. 2. Separate the dirty bag from your leg. 3. Pinch the catheter with your fingers so that pee does not spill out. 4. Separate the catheter tube from the bag tube where these tubes connect (at the connection valve). Do not let the tubes touch any surface. 5. Clean the end of the catheter tube with an alcohol wipe. Use a different alcohol wipe to clean the end of the bag tube. 6. Connect the catheter tube to the tube of the clean bag. 7. Attach the clean bag to your leg with tape or a leg strap. Do not make the bag tight on your leg. 8. Wash your hands with soap and water. General rules   Never pull on your catheter. Never try to take it out. Doing that can hurt you.  Always wash your hands before and after you touch your catheter or bag.  Use a mild, fragrance-free soap. If you do not have soap and water, use hand  sanitizer.  Always make sure there are no twists or bends (kinks) in the catheter tube.  Always make sure there are no leaks in the catheter or bag.  Drink enough fluid to keep your pee pale yellow.  Do not take baths, swim, or use a hot tub.  If you are male, wipe from front to back after you poop (have a bowel movement). Contact a doctor if:  Your pee is cloudy.  Your pee smells worse than usual.  Your catheter gets clogged.  Your catheter leaks.  Your bladder feels full. Get help right away if:  You have redness, swelling, or pain where the catheter goes into your body.  You have fluid, blood, pus, or a bad smell coming from the area where the catheter goes into your body.  Your skin feels warm where the catheter goes into your body.  You have a fever.  You have pain in your: ? Belly (abdomen). ? Legs. ? Lower back. ? Bladder.  You see blood in the catheter.  Your pee is pink or red.  You feel sick to your stomach (nauseous).  You throw up (vomit).  You have chills.  Your pee is not draining into the bag.  Your catheter gets pulled out. Summary  An indwelling urinary catheter is a thin tube that is placed into the bladder to help drain pee (urine) out of the body.  The catheter is placed into the part of the body that drains pee from the bladder (urethra).  Taking good care of your catheter will keep it working properly and help prevent problems.  Always wash your hands before and after touching your catheter or bag.  Never pull on your catheter or try to take it out. This information is not intended to replace advice given to you by your health care provider. Make sure you discuss any questions you have with your health care provider. Document Revised: 08/03/2018 Document Reviewed: 11/25/2016 [employer] Patient Education  [employer].

## 2019-07-15 NOTE — Anesthesia Procedure Notes (Signed)
Procedure Name: LMA Insertion Date/Time: 07/15/2019 8:30 AM Performed by: Mechele Claude, CRNA Pre-anesthesia Checklist: Patient identified, Emergency Drugs available, Suction available and Patient being monitored Patient Re-evaluated:Patient Re-evaluated prior to induction Oxygen Delivery Method: Circle system utilized Preoxygenation: Pre-oxygenation with 100% oxygen Induction Type: IV induction Ventilation: Mask ventilation without difficulty LMA: LMA inserted LMA Size: 4.0 Number of attempts: 1 Airway Equipment and Method: Bite block Placement Confirmation: positive ETCO2 Tube secured with: Tape Dental Injury: Teeth and Oropharynx as per pre-operative assessment

## 2019-07-15 NOTE — Op Note (Signed)
PATIENT:  Douglas Wood  PRE-OPERATIVE DIAGNOSIS: Bladder tumor  POST-OPERATIVE DIAGNOSIS: 1.  Bladder tumor 2.  History of postoperative prostatic bleeding. 3.  Prostatic urethral obstruction  PROCEDURE:  Procedure(s): 1. TRANSURETHRAL RESECTION OF BLADDER TUMOR (TURBT) (2.5 cm.) 2.  TURP (limited) 3.  Instillation of intravesical chemotherapy (Gemcitabine)  SURGEON:  Surgeon(s): Claybon Jabs  ANESTHESIA:   General  EBL:  Minimal  DRAINS: Urethral catheter (18 Fr. Foley)   SPECIMEN: 1.  Bladder tumor 2.  Prostatic urethral and bladder neck resectional biopsy  DISPOSITION OF SPECIMEN:  PATHOLOGY  Indication: Douglas Wood is an 86 year old male who was initially diagnosed with a large bladder tumor on the floor of the bladder on the right-hand side and underwent TURBT on 04/14/2017.  This revealed invasive, high-grade transitional cell carcinoma and he was treated with sensitizing chemotherapy and radiation which she completed in 4/19.  He had a recurrent tumor identified and resected in 8/20 from the posterior wall and bladder neck that was high-grade but superficial.  Recent surveillance cystoscopy revealed a recurrence of a papillary tumor located at the dome/anterior wall region.  He mentioned to me in the preoperative holding area today that after his last transurethral resection he had a great deal of bleeding from the prostate postoperatively.  He is brought to the operating room today for transurethral resection of his bladder tumor and instillation of postoperative gemcitabine to reduce the risk of recurrence.  Description of operation: The patient was taken to the operating room and administered general anesthesia. They were then placed on the table and moved to the dorsal lithotomy position after which the genitalia was sterilely prepped and draped. An official timeout was then performed.  The 74 French resectoscope with the 30 lens and visual obturator were then passed  into the bladder under direct visualization. Urethra appeared normal. The visual obturator was then removed and the Gyrus resectoscope element with 30  lens was then inserted and the bladder was fully and systematically inspected. Ureteral orifices were noted to be in the normal anatomic positions.  A tumor was located on the anterior wall just to the left of midline at approximately the 1 o'clock position and extended slightly posterior toward the dome as well as slightly onto the left wall of the bladder superiorly.  The remainder of the bladder was inspected and noted to have some trabeculation.  No other bladder tumors were identified within the bladder.  The prostatic urethra however did appear to possibly have some papillary lesions as I entered the bladder but reinspection revealed these were less prominent and may have just been superficial.  There was a lot of radiation changes to the prostatic urethra with a lot of superficial blood vessels noted.  I first began by resecting the tumor from the anterior wall.  Once this was fully resected and all bleeding points fulgurated I used the Microvasive evacuator to remove the portions of the bladder tumor and sent this to pathology as bladder tumor from anterior wall.  I then elected to resect the prostatic urethra because of my concern of possible superficial papillary cancer present there noted when I first introduced the scope.  In addition he had experienced bleeding from the prostate postoperatively and due to the presence of significant superficial blood vessels within the prostatic urethra some of which were bleeding slightly I elected to resect a good portion of the prostatic urethra in order to help prevent postoperative bleeding and also allow the specimen to be  evaluated for the presence of urothelial carcinoma.  I was able to resect this and controlled bleeding points with electrocautery.  There was no bleeding noted at the end of the procedure in  the prostatic urethra or at the resection site anteriorly.  Reinspection of the bladder revealed all obvious tumor had been fully resected and there was no evidence of perforation. The Microvasive evacuator was then used to irrigate the bladder and remove all of the portions of bladder tumor which were sent to pathology. I then removed the resectoscope.  An 49 French Foley catheter was then inserted in the bladder and irrigated. The irrigant returned slightly pink with no clots. The patient was awakened and taken to the recovery room.  While in the recovery room 2 g of gemcitabine in 52.6 cc of sterile water was instilled in the bladder through the catheter and the catheter was plugged. This will remain indwelling for approximately one hour. It will then be drained from the bladder and the catheter will be removed and the patient discharged home.  PLAN OF CARE: Discharge to home after PACU  PATIENT DISPOSITION:  PACU - hemodynamically stable.

## 2019-07-15 NOTE — Progress Notes (Signed)
Unable to void. Bladder firm, bladder scan unchanged. Dr. Karsten Ro aware. Orders given.

## 2019-07-15 NOTE — Progress Notes (Signed)
Dr. Karsten Ro aware of bladder scan results, somewhat firm feeling at bladder area, and pt."s feeling of needing to empty his bladder. Denies pain. Appears calm. Will monitor.

## 2019-07-15 NOTE — Anesthesia Postprocedure Evaluation (Signed)
Anesthesia Post Note  Patient: Douglas Wood  Procedure(s) Performed: TRANSURETHRAL RESECTION OF BLADDER TUMOR (TURBT)/ CYSTOSCOPY (N/A Bladder)     Patient location during evaluation: PACU Anesthesia Type: General Level of consciousness: awake and alert and oriented Pain management: pain level controlled Vital Signs Assessment: post-procedure vital signs reviewed and stable Respiratory status: spontaneous breathing, nonlabored ventilation and respiratory function stable Cardiovascular status: blood pressure returned to baseline Postop Assessment: no apparent nausea or vomiting Anesthetic complications: no    Last Vitals:  Vitals:   07/15/19 1030 07/15/19 1045  BP: 139/74 133/75  Pulse: 79 74  Resp: 17 12  Temp:    SpO2: 98% 94%    Last Pain:  Vitals:   07/15/19 1015  TempSrc:   PainSc: [place]

## 2019-07-15 NOTE — Transfer of Care (Signed)
  Last Vitals:  Vitals Value Taken Time  BP 136/81 07/15/19 0917  Temp    Pulse 87 07/15/19 0920  Resp 12 07/15/19 0920  SpO2 99 % 07/15/19 0920  Vitals shown include unvalidated device data.  Last Pain:  Vitals:   07/15/19 [id number]  TempSrc: Oral  PainSc: 0-No pain      Patients Stated Pain Goal: 4 (07/15/19 [id number]) Immediate Anesthesia Transfer of Care Note  Patient: Douglas Wood  Procedure(s) Performed: Procedure(s) (LRB): TRANSURETHRAL RESECTION OF BLADDER TUMOR (TURBT)/ CYSTOSCOPY (N/A)  Patient Location: PACU  Anesthesia Type: General  Level of Consciousness: awake, alert  and oriented  Airway & Oxygen Therapy: Patient Spontanous Breathing and Patient connected to nasal cannula oxygen  Post-op Assessment: Report given to PACU RN and Post -op Vital signs reviewed and stable  Post vital signs: Reviewed and stable  Complications: No apparent anesthesia complications

## 2019-07-17 LAB — SURGICAL PATHOLOGY

## 2019-11-08 ENCOUNTER — Inpatient Hospital Stay: Payer: Medicare Other | Attending: Oncology

## 2019-11-08 ENCOUNTER — Ambulatory Visit (HOSPITAL_COMMUNITY)
Admission: RE | Admit: 2019-11-08 | Discharge: 2019-11-08 | Disposition: A | Discharge status: Home / Self Care | Payer: Medicare Other | Source: Ambulatory Visit | Attending: Oncology | Admitting: Oncology

## 2019-11-08 ENCOUNTER — Other Ambulatory Visit: Payer: Self-pay

## 2019-11-08 ENCOUNTER — Encounter (HOSPITAL_COMMUNITY): Payer: Self-pay

## 2019-11-08 DIAGNOSIS — Z79899 Other long term (current) drug therapy: Secondary | ICD-10-CM | POA: Diagnosis not present

## 2019-11-08 DIAGNOSIS — D649 Anemia, unspecified: Secondary | ICD-10-CM | POA: Insufficient documentation

## 2019-11-08 DIAGNOSIS — R300 Dysuria: Secondary | ICD-10-CM | POA: Diagnosis not present

## 2019-11-08 DIAGNOSIS — D72819 Decreased white blood cell count, unspecified: Secondary | ICD-10-CM | POA: Insufficient documentation

## 2019-11-08 DIAGNOSIS — C679 Malignant neoplasm of bladder, unspecified: Secondary | ICD-10-CM | POA: Insufficient documentation

## 2019-11-08 LAB — CMP (CANCER CENTER ONLY)
ALT: 13 U/L (ref 0–44)
AST: 15 U/L (ref 15–41)
Albumin: 3.8 g/dL (ref 3.5–5.0)
Alkaline Phosphatase: 72 U/L (ref 38–126)
Anion gap: 10 (ref 5–15)
BUN: 21 mg/dL (ref 8–23)
CO2: 24 mmol/L (ref 22–32)
Calcium: 9.3 mg/dL (ref 8.9–10.3)
Chloride: 107 mmol/L (ref 98–111)
Creatinine: 0.97 mg/dL (ref 0.61–1.24)
GFR, Est AFR Am: >60 mL/min (ref >60)
GFR, Estimated: >60 mL/min (ref >60)
Glucose, Bld: 97 mg/dL (ref 70–99)
Potassium: 4.2 mmol/L (ref 3.5–5.1)
Sodium: 141 mmol/L (ref 135–145)
Total Bilirubin: 0.5 mg/dL (ref 0.3–1.2)
Total Protein: 7.4 g/dL (ref 6.5–8.1)

## 2019-11-08 LAB — CBC WITH DIFFERENTIAL (CANCER CENTER ONLY)
Abs Immature Granulocytes: 0.01 10*3/uL (ref 0.00–0.07)
Basophils Absolute: 0 10*3/uL (ref 0.0–0.1)
Basophils Relative: 1 %
Eosinophils Absolute: 0.1 10*3/uL (ref 0.0–0.5)
Eosinophils Relative: 2 %
HCT: 39.2 % (ref 39.0–52.0)
Hemoglobin: 12.8 g/dL — ABNORMAL LOW (ref 13.0–17.0)
Immature Granulocytes: 0 %
Lymphocytes Relative: 16 %
Lymphs Abs: 0.9 10*3/uL (ref 0.7–4.0)
MCH: 30.5 pg (ref 26.0–34.0)
MCHC: 32.7 g/dL (ref 30.0–36.0)
MCV: 93.3 fL (ref 80.0–100.0)
Monocytes Absolute: 0.8 10*3/uL (ref 0.1–1.0)
Monocytes Relative: 14 %
Neutro Abs: 3.7 10*3/uL (ref 1.7–7.7)
Neutrophils Relative %: 67 %
Platelet Count: 270 10*3/uL (ref 150–400)
RBC: 4.2 MIL/uL — ABNORMAL LOW (ref 4.22–5.81)
RDW: 13.5 % (ref 11.5–15.5)
WBC Count: 5.5 10*3/uL (ref 4.0–10.5)
nRBC: 0 % (ref 0.0–0.2)

## 2019-11-08 MED ORDER — SODIUM CHLORIDE 0.9 % IV SOLN
INTRAVENOUS | Status: AC
  Filled 2019-11-08: qty 250

## 2019-11-08 MED ORDER — IOHEXOL 300 MG/ML  SOLN
100.0000 mL | Freq: Once | INTRAMUSCULAR | Status: AC | PRN
  Administered 2019-11-08: 100 mL via INTRAVENOUS

## 2019-11-08 MED ORDER — SODIUM CHLORIDE (PF) 0.9 % IJ SOLN
INTRAMUSCULAR | Status: AC
  Filled 2019-11-08: qty 50

## 2019-11-15 ENCOUNTER — Other Ambulatory Visit: Payer: Self-pay

## 2019-11-15 ENCOUNTER — Telehealth: Payer: Self-pay | Admitting: Oncology

## 2019-11-15 ENCOUNTER — Inpatient Hospital Stay: Payer: Medicare Other | Admitting: Oncology

## 2019-11-15 VITALS — BP 137/70 | HR 86 | Temp 97.7°F | Resp 18 | Wt 157.9 lb

## 2019-11-15 DIAGNOSIS — C679 Malignant neoplasm of bladder, unspecified: Secondary | ICD-10-CM | POA: Diagnosis not present

## 2019-11-15 NOTE — Progress Notes (Signed)
Hematology and Oncology Follow Up Visit  Douglas Wood [id number] 11-20-1931 86 y.o. 11/15/2019 9:28 AM Douglas Wood, MDShaw, Douglas May, MD   Principle Diagnosis: 86 year old man with T2N0 high-grade urothelial carcinoma of the bladder diagnosed in 2018.  He developed a superficial bladder tumor recurrence in March 2021.  Prior Therapy:  He S/P TURBT on April 14, 2017 under the care of Douglas Wood for a 5.5 cm tumor involving the right wall of the bladder.   Definitive therapy with radiation therapy and weekly carboplatin concluded in April 2019.  He is status post BCG treatment for 6 weeks completed in June of 2021.   Current therapy: Active surveillance.  Interim History: Douglas Wood is here for return evaluation. Since the last visit, he underwent cystoscopy and resection of recurrent bladder tumor. Clinically, he reports no major complaints at this time.  He does report some occasional frequency and dysuria after he has completed BCG treatment 6 weeks ago.  He had denied any nausea, vomiting or abdominal pain.  He denies any recent hospitalization or illnesses.  He denies any hematuria or dysuria.  His performance status and quality of life remains excellent.       Medications: Reviewed without changes. Current Outpatient Medications  Medication Sig Dispense Refill  . acetaminophen (TYLENOL) 325 MG tablet Take 650 mg by mouth every 6 (six) hours as needed for mild pain, moderate pain or headache.    . diazepam (VALIUM) 5 MG tablet Take 5 mg by mouth at bedtime as needed for anxiety.     Marland Kitchen doxycycline (VIBRAMYCIN) 100 MG capsule Take 100 mg by mouth 2 (two) times daily.    Marland Kitchen ibandronate (BONIVA) 150 MG tablet Take 150 mg by mouth every 30 (thirty) days. Take in the morning with a full glass of water, on an empty stomach, and do not take anything else by mouth or lie down for the next 30 min.    . Meth-Hyo-M Bl-Na Phos-Ph Sal (URIBEL) 118 MG CAPS Take 1 capsule (118 mg  total) by mouth 4 (four) times daily as needed. 30 capsule 11  . omeprazole (PRILOSEC) 20 MG capsule Take 20 mg by mouth every evening.     . silodosin (RAPAFLO) 8 MG CAPS capsule Take 8 mg by mouth every evening.    . simvastatin (ZOCOR) 10 MG tablet Take 10 mg by mouth every evening.  0   No current facility-administered medications for this visit.     Allergies:  No Known Allergies      Physical Exam:   Blood pressure (!) 137/70, pulse 86, temperature 97.7 F (36.5 C), temperature source Temporal, resp. rate 18, weight 157 lb 14.4 oz (71.6 kg), SpO2 97 %.     ECOG: 0    General appearance: Alert, awake without any distress. Head: Atraumatic without abnormalities Oropharynx: Without any thrush or ulcers. Eyes: No scleral icterus. Lymph nodes: No lymphadenopathy noted in the cervical, supraclavicular, or axillary nodes Heart:regular rate and rhythm, without any murmurs or gallops.   Lung: Clear to auscultation without any rhonchi, wheezes or dullness to percussion. Abdomin: Soft, nontender without any shifting dullness or ascites. Musculoskeletal: No clubbing or cyanosis. Neurological: No motor or sensory deficits. Skin: No rashes or lesions.         Lab Results: Lab Results  Component Value Date   WBC 5.5 11/08/2019   HGB 12.8 (L) 11/08/2019   HCT 39.2 11/08/2019   MCV 93.3 11/08/2019   PLT 270 11/08/2019  Chemistry      Component Value Date/Time   NA 141 11/08/2019 1137   K 4.2 11/08/2019 1137   CL 107 11/08/2019 1137   CO2 24 11/08/2019 1137   BUN 21 11/08/2019 1137   CREATININE 0.97 11/08/2019 1137      Component Value Date/Time   CALCIUM 9.3 11/08/2019 1137   ALKPHOS 72 11/08/2019 1137   AST 15 11/08/2019 1137   ALT 13 11/08/2019 1137   BILITOT 0.5 11/08/2019 1137     IMPRESSION: Mild irregular bladder wall thickening with mucosal hyperenhancement along the left anterior bladder and right posterolateral bladder at the ureteral  orifice, raising concern for residual primary bladder tumor. Cystoscopy is suggested.  Status post left nephroureterectomy.  No findings suspicious for metastatic disease.  Ectasia of the ascending thoracic aorta, measuring 3.9 cm.    Impression and Plan:  86 year old man with:   1.   T2N0 high-grade urothelial carcinoma of the bladder diagnosed in December 2018.    He is currently on active surveillance with repeated cystoscopy as well as imaging studies. CT scan of plain on November 08, 2019 was personally reviewed and showed no evidence of metastatic disease. It is possible that he had recurrent disease within the bladder.  Management options moving forward were reviewed which include surveillance cystoscopy and resection of any recurrent tumor versus systemic therapy. The role for systemic chemotherapy and immunotherapy were discussed at this time. These options would preferably be used if he develops disease outside of the bladder or recurrence within his bladder that cannot be resected.  He is scheduled to have a repeat cystoscopy with Douglas Wood in the near future.  Repeat BCG versus Keytruda options were also discussed.  Complications associated with immunotherapy were reviewed and he understands that this could be an option in the future.  2.  Leukocytopenia: Resolved at this time with blood counts is within normal range.  3.  Anemia: Hemoglobin close to normal range at this time.  4.  Follow-up: Will be in 6 months for repeat imaging studies.   30  minutes were dedicated to this encounter today. Time spent on reviewing imaging studies, discussing disease status and the treatment options for the future.     Douglas Button, MD 7/23/20219:28 AM

## 2019-11-15 NOTE — Telephone Encounter (Signed)
Scheduled appointments per 7/23 los. Patient is aware of appointments dates and times.

## 2019-12-17 ENCOUNTER — Other Ambulatory Visit: Payer: Self-pay | Admitting: Urology

## 2019-12-31 ENCOUNTER — Encounter (HOSPITAL_BASED_OUTPATIENT_CLINIC_OR_DEPARTMENT_OTHER): Payer: Self-pay | Admitting: Urology

## 2019-12-31 ENCOUNTER — Other Ambulatory Visit (HOSPITAL_COMMUNITY)
Admission: RE | Admit: 2019-12-31 | Discharge: 2019-12-31 | Disposition: A | Discharge status: Home / Self Care | Payer: Medicare Other | Source: Ambulatory Visit | Attending: Urology | Admitting: Urology

## 2019-12-31 ENCOUNTER — Other Ambulatory Visit: Payer: Self-pay

## 2019-12-31 DIAGNOSIS — Z01812 Encounter for preprocedural laboratory examination: Secondary | ICD-10-CM | POA: Diagnosis present

## 2019-12-31 DIAGNOSIS — Z20822 Contact with and (suspected) exposure to covid-19: Secondary | ICD-10-CM | POA: Diagnosis not present

## 2019-12-31 LAB — SARS CORONAVIRUS 2 (TAT 6-24 HRS): SARS Coronavirus 2: NEGATIVE

## 2019-12-31 NOTE — Progress Notes (Addendum)
Spoke w/ via phone for pre-op interview---pt Lab needs dos----  I stat 8 (gent)           Lab results------none COVID test ------12-31-2019 at 910 am Arrive at -------1115 am 01-03-2020 NPO after MN NO Solid Food.  Clear liquids from MN until---1015 am then npo Medications to take morning of surgery -----none Diabetic medication -----n/a Patient Special Instructions -----none Pre-Op special Istructions -----none Patient verbalized understanding of instructions that were given at this phone interview. Patient denies shortness of breath, chest pain, fever, cough at this phone interview.

## 2020-01-02 NOTE — Anesthesia Preprocedure Evaluation (Addendum)
Anesthesia Evaluation  Patient identified by MRN, date of birth, ID band Patient awake    Reviewed: Allergy & Precautions, NPO status , Patient's Chart, lab work & pertinent test results  Airway Mallampati: II  TM Distance: >3 FB Neck ROM: Full    Dental no notable dental hx. (+) Teeth Intact, Dental Advisory Given   Pulmonary neg pulmonary ROS, former smoker,    Pulmonary exam normal breath sounds clear to auscultation       Cardiovascular Exercise Tolerance: Good negative cardio ROS Normal cardiovascular exam Rhythm:Regular Rate:Normal     Neuro/Psych negative neurological ROS  negative psych ROS   GI/Hepatic Neg liver ROS, GERD  ,  Endo/Other  negative endocrine ROS  Renal/GU Renal diseaseS/P TURBT     Musculoskeletal  (+) Arthritis ,   Abdominal   Peds  Hematology negative hematology ROS (+)   Anesthesia Other Findings   Reproductive/Obstetrics                            Anesthesia Physical Anesthesia Plan  ASA: II  Anesthesia Plan: General   Post-op Pain Management:    Induction: Intravenous  PONV Risk Score and Plan: 3 and Treatment may vary due to age or medical condition, Ondansetron and Dexamethasone  Airway Management Planned: LMA  Additional Equipment: None  Intra-op Plan:   Post-operative Plan:   Informed Consent: I have reviewed the patients History and Physical, chart, labs and discussed the procedure including the risks, benefits and alternatives for the proposed anesthesia with the patient or authorized representative who has indicated his/her understanding and acceptance.     Dental advisory given  Plan Discussed with: CRNA and Anesthesiologist  Anesthesia Plan Comments:        Anesthesia Quick Evaluation

## 2020-01-03 ENCOUNTER — Ambulatory Visit (HOSPITAL_BASED_OUTPATIENT_CLINIC_OR_DEPARTMENT_OTHER): Payer: Medicare Other | Admitting: Anesthesiology

## 2020-01-03 ENCOUNTER — Other Ambulatory Visit: Payer: Self-pay

## 2020-01-03 ENCOUNTER — Encounter (HOSPITAL_BASED_OUTPATIENT_CLINIC_OR_DEPARTMENT_OTHER): Payer: Self-pay | Admitting: Urology

## 2020-01-03 ENCOUNTER — Encounter (HOSPITAL_BASED_OUTPATIENT_CLINIC_OR_DEPARTMENT_OTHER)
Admission: RE | Disposition: A | Discharge status: Home / Self Care | Payer: Self-pay | Source: Home / Self Care | Attending: Urology

## 2020-01-03 ENCOUNTER — Ambulatory Visit (HOSPITAL_BASED_OUTPATIENT_CLINIC_OR_DEPARTMENT_OTHER)
Admission: RE | Admit: 2020-01-03 | Discharge: 2020-01-03 | Disposition: A | Discharge status: Home / Self Care | Payer: Medicare Other | Attending: Urology | Admitting: Urology

## 2020-01-03 DIAGNOSIS — E785 Hyperlipidemia, unspecified: Secondary | ICD-10-CM | POA: Diagnosis not present

## 2020-01-03 DIAGNOSIS — Z87891 Personal history of nicotine dependence: Secondary | ICD-10-CM | POA: Insufficient documentation

## 2020-01-03 DIAGNOSIS — C678 Malignant neoplasm of overlapping sites of bladder: Secondary | ICD-10-CM | POA: Diagnosis not present

## 2020-01-03 DIAGNOSIS — C680 Malignant neoplasm of urethra: Secondary | ICD-10-CM | POA: Insufficient documentation

## 2020-01-03 DIAGNOSIS — Z8042 Family history of malignant neoplasm of prostate: Secondary | ICD-10-CM | POA: Insufficient documentation

## 2020-01-03 DIAGNOSIS — Z87442 Personal history of urinary calculi: Secondary | ICD-10-CM | POA: Diagnosis not present

## 2020-01-03 DIAGNOSIS — Z85828 Personal history of other malignant neoplasm of skin: Secondary | ICD-10-CM | POA: Insufficient documentation

## 2020-01-03 DIAGNOSIS — Z8052 Family history of malignant neoplasm of bladder: Secondary | ICD-10-CM | POA: Insufficient documentation

## 2020-01-03 DIAGNOSIS — Z8551 Personal history of malignant neoplasm of bladder: Secondary | ICD-10-CM | POA: Diagnosis not present

## 2020-01-03 DIAGNOSIS — K589 Irritable bowel syndrome without diarrhea: Secondary | ICD-10-CM | POA: Diagnosis not present

## 2020-01-03 DIAGNOSIS — M8580 Other specified disorders of bone density and structure, unspecified site: Secondary | ICD-10-CM | POA: Insufficient documentation

## 2020-01-03 DIAGNOSIS — R310 Gross hematuria: Secondary | ICD-10-CM | POA: Insufficient documentation

## 2020-01-03 DIAGNOSIS — Z923 Personal history of irradiation: Secondary | ICD-10-CM | POA: Insufficient documentation

## 2020-01-03 DIAGNOSIS — C679 Malignant neoplasm of bladder, unspecified: Secondary | ICD-10-CM | POA: Diagnosis present

## 2020-01-03 DIAGNOSIS — Z905 Acquired absence of kidney: Secondary | ICD-10-CM | POA: Diagnosis not present

## 2020-01-03 DIAGNOSIS — I10 Essential (primary) hypertension: Secondary | ICD-10-CM | POA: Insufficient documentation

## 2020-01-03 DIAGNOSIS — K219 Gastro-esophageal reflux disease without esophagitis: Secondary | ICD-10-CM | POA: Insufficient documentation

## 2020-01-03 DIAGNOSIS — Z79899 Other long term (current) drug therapy: Secondary | ICD-10-CM | POA: Diagnosis not present

## 2020-01-03 DIAGNOSIS — Z9221 Personal history of antineoplastic chemotherapy: Secondary | ICD-10-CM | POA: Diagnosis not present

## 2020-01-03 DIAGNOSIS — M1990 Unspecified osteoarthritis, unspecified site: Secondary | ICD-10-CM | POA: Insufficient documentation

## 2020-01-03 HISTORY — PX: TRANSURETHRAL RESECTION OF BLADDER TUMOR: SHX2575

## 2020-01-03 HISTORY — PX: CYSTOSCOPY W/ RETROGRADES: SHX1426

## 2020-01-03 LAB — POCT I-STAT, CHEM 8
BUN: 27 mg/dL — ABNORMAL HIGH (ref 8–23)
Calcium, Ion: 1.26 mmol/L (ref 1.15–1.40)
Chloride: 105 mmol/L (ref 98–111)
Creatinine, Ser: 1 mg/dL (ref 0.61–1.24)
Glucose, Bld: 98 mg/dL (ref 70–99)
HCT: 42 % (ref 39.0–52.0)
Hemoglobin: 14.3 g/dL (ref 13.0–17.0)
Potassium: 4.2 mmol/L (ref 3.5–5.1)
Sodium: 144 mmol/L (ref 135–145)
TCO2: 27 mmol/L (ref 22–32)

## 2020-01-03 SURGERY — TURBT (TRANSURETHRAL RESECTION OF BLADDER TUMOR)
Anesthesia: General | Site: Bladder

## 2020-01-03 MED ORDER — PROPOFOL 10 MG/ML IV BOLUS
INTRAVENOUS | Status: AC
  Filled 2020-01-03: qty 20

## 2020-01-03 MED ORDER — GENTAMICIN SULFATE 40 MG/ML IJ SOLN
5.0000 mg/kg | INTRAVENOUS | Status: AC
  Administered 2020-01-03: 340 mg via INTRAVENOUS
  Filled 2020-01-03: qty 8.5

## 2020-01-03 MED ORDER — PROPOFOL 10 MG/ML IV BOLUS
INTRAVENOUS | Status: DC | PRN
  Administered 2020-01-03: 140 mg via INTRAVENOUS

## 2020-01-03 MED ORDER — FENTANYL CITRATE (PF) 100 MCG/2ML IJ SOLN
INTRAMUSCULAR | Status: DC | PRN
Start: 2020-01-03 — End: 2020-01-03
  Administered 2020-01-03: 50 ug via INTRAVENOUS
  Administered 2020-01-03 (×2): 25 ug via INTRAVENOUS

## 2020-01-03 MED ORDER — DEXAMETHASONE SODIUM PHOSPHATE 10 MG/ML IJ SOLN
INTRAMUSCULAR | Status: DC | PRN
  Administered 2020-01-03: 10 mg via INTRAVENOUS

## 2020-01-03 MED ORDER — FENTANYL CITRATE (PF) 100 MCG/2ML IJ SOLN
INTRAMUSCULAR | Status: AC
  Filled 2020-01-03: qty 2

## 2020-01-03 MED ORDER — DEXAMETHASONE SODIUM PHOSPHATE 10 MG/ML IJ SOLN
INTRAMUSCULAR | Status: AC
  Filled 2020-01-03: qty 1

## 2020-01-03 MED ORDER — FENTANYL CITRATE (PF) 100 MCG/2ML IJ SOLN
25.0000 ug | INTRAMUSCULAR | Status: DC | PRN
  Administered 2020-01-03 (×2): 25 ug via INTRAVENOUS

## 2020-01-03 MED ORDER — ONDANSETRON HCL 4 MG/2ML IJ SOLN: 4.0000 mg | Freq: Once | INTRAMUSCULAR | Status: DC | PRN

## 2020-01-03 MED ORDER — TRAMADOL HCL 50 MG PO TABS: 50.0000 mg | ORAL_TABLET | Freq: Four times a day (QID) | ORAL | 0 refills | Status: DC | PRN

## 2020-01-03 MED ORDER — ONDANSETRON HCL 4 MG/2ML IJ SOLN
INTRAMUSCULAR | Status: DC | PRN
  Administered 2020-01-03: 4 mg via INTRAVENOUS

## 2020-01-03 MED ORDER — URIBEL 118 MG PO CAPS: 1.0000 | ORAL_CAPSULE | Freq: Three times a day (TID) | ORAL | 11 refills | Status: DC | PRN

## 2020-01-03 MED ORDER — OXYCODONE HCL 5 MG PO TABS
ORAL_TABLET | ORAL | Status: AC
  Filled 2020-01-03: qty 1

## 2020-01-03 MED ORDER — LIDOCAINE 2% (20 MG/ML) 5 ML SYRINGE
INTRAMUSCULAR | Status: AC
  Filled 2020-01-03: qty 5

## 2020-01-03 MED ORDER — ACETAMINOPHEN 10 MG/ML IV SOLN: 1000.0000 mg | Freq: Once | INTRAVENOUS | Status: DC | PRN

## 2020-01-03 MED ORDER — LACTATED RINGERS IV SOLN
INTRAVENOUS | Status: DC
  Administered 2020-01-03: 50 mL via INTRAVENOUS

## 2020-01-03 MED ORDER — ONDANSETRON HCL 4 MG/2ML IJ SOLN
INTRAMUSCULAR | Status: AC
  Filled 2020-01-03: qty 2

## 2020-01-03 MED ORDER — LIDOCAINE 2% (20 MG/ML) 5 ML SYRINGE
INTRAMUSCULAR | Status: DC | PRN
  Administered 2020-01-03: 80 mg via INTRAVENOUS

## 2020-01-03 MED ORDER — OXYCODONE HCL 5 MG PO TABS
5.0000 mg | ORAL_TABLET | Freq: Once | ORAL | Status: AC
  Administered 2020-01-03: 5 mg via ORAL

## 2020-01-03 SURGICAL SUPPLY — 31 items
BAG DRAIN URO-CYSTO SKYTR STRL (DRAIN) ×4 IMPLANT
BAG DRN RND TRDRP ANRFLXCHMBR (UROLOGICAL SUPPLIES)
BAG DRN UROCATH (DRAIN) ×2
BAG URINE DRAIN 2000ML AR STRL (UROLOGICAL SUPPLIES) IMPLANT
BAG URINE LEG 500ML (DRAIN) IMPLANT
CATH FOLEY 2WAY SLVR  5CC 22FR (CATHETERS)
CATH FOLEY 2WAY SLVR 30CC 20FR (CATHETERS) IMPLANT
CATH FOLEY 2WAY SLVR 5CC 22FR (CATHETERS) IMPLANT
CATH INTERMIT  6FR 70CM (CATHETERS) ×4 IMPLANT
CLOTH BEACON ORANGE TIMEOUT ST (SAFETY) ×4 IMPLANT
ELECT REM PT RETURN 9FT ADLT (ELECTROSURGICAL) ×4
ELECTRODE REM PT RTRN 9FT ADLT (ELECTROSURGICAL) ×2 IMPLANT
EVACUATOR MICROVAS BLADDER (UROLOGICAL SUPPLIES) IMPLANT
GLOVE BIO SURGEON STRL SZ7.5 (GLOVE) ×4 IMPLANT
GOWN STRL REUS W/ TWL LRG LVL3 (GOWN DISPOSABLE) ×2 IMPLANT
GOWN STRL REUS W/TWL LRG LVL3 (GOWN DISPOSABLE) ×8 IMPLANT
GUIDEWIRE ANG ZIPWIRE 038X150 (WIRE) IMPLANT
GUIDEWIRE STR DUAL SENSOR (WIRE) IMPLANT
IV NS 1000ML (IV SOLUTION) ×4
IV NS 1000ML BAXH (IV SOLUTION) ×2 IMPLANT
IV NS IRRIG 3000ML ARTHROMATIC (IV SOLUTION) ×12 IMPLANT
KIT TURNOVER CYSTO (KITS) ×4 IMPLANT
LOOP CUT BIPOLAR 24F LRG (ELECTROSURGICAL) IMPLANT
MANIFOLD NEPTUNE II (INSTRUMENTS) ×4 IMPLANT
NS IRRIG 500ML POUR BTL (IV SOLUTION) ×4 IMPLANT
PACK CYSTO (CUSTOM PROCEDURE TRAY) ×4 IMPLANT
SYR 10ML LL (SYRINGE) IMPLANT
SYR TOOMEY IRRIG 70ML (MISCELLANEOUS)
SYRINGE TOOMEY IRRIG 70ML (MISCELLANEOUS) IMPLANT
TUBE CONNECTING 12'X1/4 (SUCTIONS)
TUBE CONNECTING 12X1/4 (SUCTIONS) IMPLANT

## 2020-01-03 NOTE — Transfer of Care (Signed)
Immediate Anesthesia Transfer of Care Note  Patient: Douglas Wood  Procedure(s) Performed: TRANSURETHRAL RESECTION OF BLADDER TUMOR (TURBT) (N/A Bladder) CYSTOSCOPY WITH RETROGRADE PYELOGRAM (Bilateral Bladder)  Patient Location: PACU  Anesthesia Type:General  Level of Consciousness: awake, alert  and oriented  Airway & Oxygen Therapy: Patient Spontanous Breathing and Patient connected to nasal cannula oxygen  Post-op Assessment: Report given to RN and Post -op Vital signs reviewed and stable  Post vital signs: Reviewed and stable  Last Vitals:  Vitals Value Taken Time  BP 158/86 01/03/20 1300  Temp    Pulse 71 01/03/20 1302  Resp 17 01/03/20 1302  SpO2 97 % 01/03/20 1302  Vitals shown include unvalidated device data.  Last Pain:  Vitals:   01/03/20 1104  TempSrc: Oral  PainSc: 0-No pain      Patients Stated Pain Goal: 3 (71/69/67 8938)  Complications: No complications documented.

## 2020-01-03 NOTE — H&P (Signed)
Urology Admission H&P  Chief Complaint: PRe-Op Transurethral Resection of Bladder Tumor  History of Present Illness:   1 - Bladder Cancer - s/p chemo-XRT for T2 bladder cancer, now on bladder preservation protocool with surveillance and PRN resection. Noted to have anterior and dome recurrecne at surveillance cysto 11/2019.   2 - Solitary Right Kidney - congenital   Today "Douglas Wood" is seen to proceed with TURBT. No interval fevers. UA without infectious parameters. C19 screen negative.    Past Medical History:  Diagnosis Date  . Arthritis    hands  . Bladder cancer [facility]) urologist-  dr ottelin/  oncologist---dr shadad   dx 04-14-2017 per high grade invasive urothelial carcinoma;   s/p  TURBT;    completed chemo/ radiation 04/ 2019  . Congenital absence of left kidney   . GERD (gastroesophageal reflux disease)   . History of basal cell carcinoma excision    hx multiple BCC of skin removals  . History of cancer chemotherapy    completed 07-2017  . History of chronic prostatitis UROLOGIST-  DR Consuella Lose  . History of external beam radiation therapy    completed 04-/ 2019 for bladder cancer  . History of hypertension    per pt no medication since 2018, blood pressure normalized  . History of kidney stones   . History of squamous cell carcinoma excision    multiple SCC of skin removals  . Hyperlipidemia   . IBS (irritable bowel syndrome)   . IBS (irritable bowel syndrome)   . Osteopenia   . Solitary kidney    right  . Wears glasses    Past Surgical History:  Procedure Laterality Date  . bcg treatment  last done may 2021   x6  . EXCISION BENIGN LYMPH NODE LEFT CERCIAL REGION  09-11-2013    dr toth  [facility]  . HEMORROIDECTOMY  05/30/2005  . RIGHT URETEROSCOPY W/ STONE EXTRACTION AND STENT PLACEMENT  04-04-2008    dr Everlene Balls  [facility]  . TRANSURETHRAL RESECTION OF BLADDER TUMOR N/A 04/14/2017   Procedure: TRANSURETHRAL RESECTION OF BLADDER TUMOR  (TURBT);  Surgeon: Kathie Rhodes, MD;   Location: [facility];  Service: Urology;  Laterality: N/A;  . TRANSURETHRAL RESECTION OF BLADDER TUMOR N/A 05/22/2017   Procedure: TRANSURETHRAL RESECTION OF BLADDER TUMOR (TURBT);  Surgeon: Kathie Rhodes, MD;  Location: [facility];  Service: Urology;  Laterality: N/A;  . TRANSURETHRAL RESECTION OF BLADDER TUMOR N/A 12/07/2018   Procedure: CYSTOSCOPY, TRANSURETHRAL RESECTION OF BLADDER TUMOR (TURBT) WITH INSTILLATION OF POST OPERATIVE CHEMOTHERAPY;  Surgeon: Kathie Rhodes, MD;  Location: [place];  Service: Urology;  Laterality: N/A;  ONLY NEEDS 60 MIN  . TRANSURETHRAL RESECTION OF BLADDER TUMOR N/A 07/15/2019   Procedure: TRANSURETHRAL RESECTION OF BLADDER TUMOR (TURBT)/ CYSTOSCOPY;  Surgeon: Kathie Rhodes, MD;  Location: [place];  Service: Urology;  Laterality: N/A;    Home Medications:  No current facility-administered medications for this encounter.   Current Outpatient Medications  Medication Sig Dispense Refill Last Dose  . acetaminophen (TYLENOL) 325 MG tablet Take 650 mg by mouth every 6 (six) hours as needed for mild pain, moderate pain or headache.     . diazepam (VALIUM) 5 MG tablet Take 5 mg by mouth at bedtime as needed for anxiety.      . ibandronate (BONIVA) 150 MG tablet Take 150 mg by mouth every 30 (thirty) days. Take in the morning with a full glass of water, on an empty stomach,  and do not take anything else by mouth or lie down for the next 30 min.     Marland Kitchen omeprazole (PRILOSEC) 20 MG capsule Take 20 mg by mouth every evening.      . silodosin (RAPAFLO) 8 MG CAPS capsule Take 8 mg by mouth every evening.     . simvastatin (ZOCOR) 10 MG tablet Take 10 mg by mouth every evening.  0    Allergies: No Known Allergies  Family History  Problem Relation Age of Onset  . Prostate cancer Father   . Bladder Cancer Brother        bladder/dx 30 years ago   Social History:  reports that he quit smoking about 55 years  ago. His smoking use included cigarettes. He has a 10.00 pack-year smoking history. He has never used smokeless tobacco. He reports current alcohol use. He reports that he does not use drugs.  Review of Systems  Constitutional: Negative for chills and fever.  All other systems reviewed and are negative.   Physical Exam:  Vital signs in last 24 hours:   Physical Exam Vitals reviewed.  HENT:     Head: Normocephalic.     Nose: Nose normal.  Eyes:     Pupils: Pupils are equal, round, and reactive to light.  Abdominal:     General: Abdomen is flat.  Musculoskeletal:        General: Normal range of motion.     Cervical back: Normal range of motion.  Skin:    General: Skin is warm.  Neurological:     General: No focal deficit present.     Mental Status: He is alert.  Psychiatric:        Mood and Affect: Mood normal.     Laboratory Data:  No results found for this or any previous visit (from the past 24 hour(s)). Recent Results (from the past 240 hour(s))  SARS CORONAVIRUS 2 (TAT 6-24 HRS) Nasopharyngeal Nasopharyngeal Swab     Status: None   Collection Time: 12/31/19  9:14 AM   Specimen: Nasopharyngeal Swab  Result Value Ref Range Status   SARS Coronavirus 2 NEGATIVE NEGATIVE Final    Comment: (NOTE) SARS-CoV-2 target nucleic acids are NOT DETECTED.  The SARS-CoV-2 RNA is generally detectable in upper and lower respiratory specimens during the acute phase of infection. Negative results do not preclude SARS-CoV-2 infection, do not rule out co-infections with other pathogens, and should not be used as the sole basis for treatment or other patient management decisions. Negative results must be combined with clinical observations, patient history, and epidemiological information. The expected result is Negative.  Fact Sheet for Patients: SugarRoll.be  Fact Sheet for Healthcare Providers: https://www.woods-mathews.com/  This test  is not yet approved or cleared by the [place] FDA and  has been authorized for detection and/or diagnosis of SARS-CoV-2 by FDA under an Emergency Use Authorization (EUA). This EUA will remain  in effect (meaning this test can be used) for the duration of the COVID-19 declaration under Se ction 564(b)(1) of the Act, 21 U.S.C. section 360bbb-3(b)(1), unless the authorization is terminated or revoked sooner.  Performed at [facility], [place] [address], [place], [place] [address]      Plan:   Proceed as planned with TURBT and Rt retrograde. Risks, benefits, alternatives, expected peri-op course discussed previously and reiterated today.   Alexis Frock 01/03/2020, 7:18 AM

## 2020-01-03 NOTE — Discharge Instructions (Signed)
1 - You may have urinary urgency (bladder spasms) and bloody urine on / off x 2 weeks. This is normal.  2 - Call MD or go to ER for fever >102, severe pain / nausea / vomiting not relieved by medications, or acute change in medical status   Post Anesthesia Home Care Instructions  Activity: Get plenty of rest for the remainder of the day. A responsible adult should stay with you for 24 hours following the procedure.  For the next 24 hours, DO NOT: -Drive a car -Paediatric nurse -Drink alcoholic beverages -Take any medication unless instructed by your physician -Make any legal decisions or sign important papers.  Meals: Start with liquid foods such as gelatin or soup. Progress to regular foods as tolerated. Avoid greasy, spicy, heavy foods. If nausea and/or vomiting occur, drink only clear liquids until the nausea and/or vomiting subsides. Call your physician if vomiting continues.  Special Instructions/Symptoms: Your throat may feel dry or sore from the anesthesia or the breathing tube placed in your throat during surgery. If this causes discomfort, gargle with warm salt water. The discomfort should disappear within 24 hours.  If you had a scopolamine patch placed behind your ear for the management of post- operative nausea and/or vomiting:  1. The medication in the patch is effective for 72 hours, after which it should be removed.  Wrap patch in a tissue and discard in the trash. Wash hands thoroughly with soap and water. 2. You may remove the patch earlier than 72 hours if you experience unpleasant side effects which may include dry mouth, dizziness or visual disturbances. 3. Avoid touching the patch. Wash your hands with soap and water after contact with the patch.    Transurethral Resection of Bladder Tumor  Transurethral resection of a bladder tumor is the removal (resection) of a cancerous growth (tumor) on the inside wall of the bladder. The bladder is the organ that holds  urine. The tumor is removed through the tube that carries urine out of the body (urethra). In a transurethral resection, a thin telescope with a light, a tiny camera, and an electric cutting edge (resectoscope) is passed through the urethra. In men, the opening of the urethra is at the end of the penis. In women, it is just above the opening of the vagina. Tell a health care provider about:  Any allergies you have.  All medicines you are taking, including vitamins, herbs, eye drops, creams, and over-the-counter medicines.  Any problems you or family members have had with anesthetic medicines.  Any blood disorders you have.  Any surgeries you have had.  Any medical conditions you have.  Any recent urinary tract infections you have had.  Whether you are pregnant or may be pregnant. What are the risks? Generally, this is a safe procedure. However, problems may occur, including:  Infection.  Bleeding.  Allergic reactions to medicines.  Damage to nearby structures or organs, such as: ? The urethra. ? The tubes that drain urine from the kidneys into the bladder (ureters).  Pain and burning during urination.  Difficulty urinating due to partial blockage of the urethra.  Inability to urinate (urinary retention). What happens before the procedure? Staying hydrated Follow instructions from your health care provider about hydration, which may include:  Up to 2 hours before the procedure - you may continue to drink clear liquids, such as water, clear fruit juice, black coffee, and plain tea.  Eating and drinking restrictions Follow instructions from your health care provider  about eating and drinking, which may include:  8 hours before the procedure - stop eating heavy meals or foods, such as meat, fried foods, or fatty foods.  6 hours before the procedure - stop eating light meals or foods, such as toast or cereal.  6 hours before the procedure - stop drinking milk or drinks  that contain milk.  2 hours before the procedure - stop drinking clear liquids. Medicines Ask your health care provider about:  Changing or stopping your regular medicines. This is especially important if you are taking diabetes medicines or blood thinners.  Taking medicines such as aspirin and ibuprofen. These medicines can thin your blood. Do not take these medicines unless your health care provider tells you to take them.  Taking over-the-counter medicines, vitamins, herbs, and supplements. Tests You may have exams or tests, including:  Physical exam.  Blood tests.  Urine tests.  Electrocardiogram (ECG). This test measures the electrical activity of the heart. General instructions  Plan to have someone take you home from the hospital or clinic.  Ask your health care provider how your surgical site will be marked or identified.  Ask your health care provider what steps will be taken to help prevent infection. These may include: ? Washing skin with a germ-killing soap. ? Taking antibiotic medicine. What happens during the procedure?  An IV will be inserted into one of your veins.  You will be given one or more of the following: ? A medicine to help you relax (sedative). ? A medicine to make you fall asleep (general anesthetic). ? A medicine that is injected into your spine to numb the area below and slightly above the injection site (spinal anesthetic).  Your legs will be placed in foot rests (stirrups) so that your legs are apart and your knees are bent.  The resectoscope will be passed through your urethra and into your bladder.  The part of your bladder that is affected by the tumor will be resected using the cutting edge of the resectoscope.  The resectoscope will be removed.  A thin, flexible tube (catheter) will be passed through your urethra and into your bladder. The catheter will drain urine into a bag outside of your body. ? Fluid may be passed through the  catheter to keep the catheter open. The procedure may vary among health care providers and hospitals. What happens after the procedure?  Your blood pressure, heart rate, breathing rate, and blood oxygen level will be monitored until you leave the hospital or clinic.  You may continue to receive fluids and medicines through an IV.  You will have some pain. You will be given pain medicine to relieve pain.  You will have a catheter to drain your urine. ? You will have blood in your urine. Your catheter may be kept in until your urine is clear. ? The amount of urine will be monitored. If necessary, your bladder may be rinsed out (irrigated) by passing fluid through your catheter.  You will be encouraged to walk around as soon as possible.  You may have to wear compression stockings. These stockings help to prevent blood clots and reduce swelling in your legs.  Do not drive for 24 hours if you were given a sedative during your procedure. Summary  Transurethral resection of a bladder tumor is the removal (resection) of a cancerous growth (tumor) on the inside wall of the bladder.  To do this procedure, your health care provider uses a thin telescope with a  light, a tiny camera, and an electric cutting edge (resectoscope).  Follow your health care provider's instructions. You may need to stop or change certain medicines, and you may be told to stop eating and drinking several hours before the procedure.  Your blood pressure, heart rate, breathing rate, and blood oxygen level will be monitored until you leave the hospital or clinic.  You may have to wear compression stockings. These stockings help to prevent blood clots and reduce swelling in your legs. This information is not intended to replace advice given to you by your health care provider. Make sure you discuss any questions you have with your health care provider. Document Revised: 11/10/2017 Document Reviewed: 11/10/2017 Elsevier  Patient Education  [employer].

## 2020-01-03 NOTE — Anesthesia Postprocedure Evaluation (Signed)
Anesthesia Post Note  Patient: Douglas Wood  Procedure(s) Performed: TRANSURETHRAL RESECTION OF BLADDER TUMOR (TURBT) (N/A Bladder) CYSTOSCOPY WITH RETROGRADE PYELOGRAM (Bilateral Bladder)     Patient location during evaluation: PACU Anesthesia Type: General Level of consciousness: sedated Pain management: pain level controlled Vital Signs Assessment: post-procedure vital signs reviewed and stable Respiratory status: spontaneous breathing and respiratory function stable Cardiovascular status: stable Postop Assessment: no apparent nausea or vomiting Anesthetic complications: no   No complications documented.  Last Vitals:  Vitals:   01/03/20 1300 01/03/20 1302  BP: (!) 158/86   Pulse: 70 66  Resp: 14 17  Temp:  (!) 36.3 C  SpO2: 96% 96%    Last Pain:  Vitals:   01/03/20 1329  TempSrc:   PainSc: 8                  Candra R Navneet Schmuck

## 2020-01-03 NOTE — Op Note (Signed)
Douglas Wood, [facility] MEDICAL RECORD [id number] ACCOUNT [id number] DATE OF BIRTH:03/09/1932 FACILITY: [facility] LOCATION: [facility] PHYSICIAN:Jernee Murtaugh Tresa Moore, MD  OPERATIVE REPORT  DATE OF PROCEDURE:  01/03/2020  SURGEON:  Alexis Frock, MD  PREOPERATIVE DIAGNOSIS:  Recurrent bladder cancer.  PROCEDURE: 1.  Transection of bladder tumor, volume large. 2.  Right retrograde pyelogram, interpretation.  ESTIMATED BLOOD LOSS:  20 mL.  COMPLICATIONS:  None.  SPECIMEN:  Recurrent bladder tumor for permanent pathology.  FINDINGS: 1.  Multifocal large volume papillary bladder tumor in all quadrants of the bladder, as well as new papillary bladder tumor in all quadrants of the pendulous urethra. 2.  Solitary right kidney. 3.  Prior TURP defect. 4.  Total tumor resection / ablation area approximately 10 cm2. 5.  Unremarkable right retrograde pyelogram.  INDICATIONS:  The patient is a pleasant 86 year old man with longstanding history of high-grade bladder cancer.  He is status post chemoradiation years ago.  He has had multiple recurrences since this time.  He was found on surveillance cystoscopy to  have gross recurrence of tumor, as well as gross hematuria.  Options were discussed, including recommended path of palliative intent transurethral resection and he wished to proceed.  Informed consent obtained and placed in the medical record.  DESCRIPTION OF PROCEDURE:  The patient is being identified, the procedure being transection of bladder tumor with retrograde pyelogram was confirmed.  Procedure timeout was performed.  Intravenous antibiotics administered.  General anesthesia induced.   The patient was placed into a low lithotomy position.  Sterile field was created, prepping and draping the patient's penis, perineum, proximal thighs using iodine.  Cystourethroscopy was performed with a 21-French rigid cystoscope with offset lens.   Inspection of the anterior and posterior urethra  revealed a new significant volume papillary tumor in the pendulous urethra.  Photodocumentation performed.  This was circumferential and multifocal, however, there was still patent lumen.  There was a  prior TURP defect.  Inspection of bladder revealed multifocal papillary tumor in all quadrants of the bladder, mostly trigone and dome.  Solitary right ureteral orifice was fortunately and grossly uninvolved.  The right ureter was cannulated with a  6-French end-hole catheter and right ureteral pyelogram was obtained.  Right retrograde pyelogram demonstrated single right ureter with single system right kidney.  No filling defects or narrowing noted.  Next, the cystoscope was exchanged for a 26-French resectoscope sheath with visual obturator and systematic resection  was performed of all visible papillary tumor down to the very superficial fibromuscular stroma of the urinary bladder.  Purposeful deep muscle resection was not performed given the palliative intent of the procedure today.  The entire fulguration as well  as the entire tumor base area was aggressively fulgurated as well as a large swath of surrounding erythematous mucosa for a total resection and fulguration area of approximately 10 cm2.  Again purposeful preservation of the right ureteral orifice was  performed.  There was a dominant foci of pendulous urethral tumor in the more proximal urethra that was quite friable and large, impinging luminal obstruction.  This was very carefully resected using a cold technique, brushing the tumor off of the  urethral mucosa and performing exquisitely careful point coagulation current only to areas of bleeding.  This resulted in excellent hemostatic control of this area and better patency of the urethral lumen.  Bladder tumor fragments were irrigated and set  aside for permanent pathology.  Bladder was partially emptied and procedure was terminated.  The patient tolerated the procedure  well.  No immediate  perioperative complications.  The patient was taken to postanesthesia care unit in stable condition for a  trial of void and discharge home.  VN/NUANCE  D:01/03/2020 T:01/03/2020 JOB:[id number]

## 2020-01-03 NOTE — Brief Op Note (Signed)
01/03/2020  12:51 PM  PATIENT:  Douglas Wood  86 y.o. male  PRE-OPERATIVE DIAGNOSIS:  RECURRENT BLADDER CANCER  POST-OPERATIVE DIAGNOSIS:  RECURRENT BLADDER CANCER  PROCEDURE:  Procedure(s) with comments: TRANSURETHRAL RESECTION OF BLADDER TUMOR (TURBT) (N/A) - 1 HR CYSTOSCOPY WITH RETROGRADE PYELOGRAM (Bilateral)  SURGEON:  Surgeon(s) and Role:    * Alexis Frock, MD - Primary  PHYSICIAN ASSISTANT:   ASSISTANTS: none   ANESTHESIA:   general  EBL:  minimal   BLOOD ADMINISTERED:none  DRAINS: none   LOCAL MEDICATIONS USED:  NONE  SPECIMEN:  Source of Specimen:  recurrent bladder tumor  DISPOSITION OF SPECIMEN:  PATHOLOGY  COUNTS:  YES  TOURNIQUET:  * No tourniquets in log *  DICTATION: .Other Dictation: Dictation Number [id number]  PLAN OF CARE: Discharge to home after PACU  PATIENT DISPOSITION:  PACU - hemodynamically stable.   Delay start of Pharmacological VTE agent (>24hrs) due to surgical blood loss or risk of bleeding: yes

## 2020-01-03 NOTE — Anesthesia Procedure Notes (Signed)
Procedure Name: LMA Insertion Date/Time: 01/03/2020 12:17 PM Performed by: Antwoine Zorn D, CRNA Pre-anesthesia Checklist: Patient identified, Emergency Drugs available, Suction available and Patient being monitored Patient Re-evaluated:Patient Re-evaluated prior to induction Oxygen Delivery Method: Circle system utilized Preoxygenation: Pre-oxygenation with 100% oxygen Induction Type: IV induction Ventilation: Mask ventilation without difficulty LMA: LMA inserted LMA Size: 4.0 Tube type: Oral Number of attempts: 1 Placement Confirmation: positive ETCO2 and breath sounds checked- equal and bilateral Tube secured with: Tape Dental Injury: Teeth and Oropharynx as per pre-operative assessment

## 2020-01-06 ENCOUNTER — Encounter (HOSPITAL_BASED_OUTPATIENT_CLINIC_OR_DEPARTMENT_OTHER): Payer: Self-pay | Admitting: Urology

## 2020-01-06 LAB — SURGICAL PATHOLOGY

## 2020-01-23 ENCOUNTER — Telehealth: Payer: Self-pay | Admitting: Oncology

## 2020-01-23 NOTE — Telephone Encounter (Signed)
Scheduled appt per 9/29 sch msg - pt awre of appt date and time

## 2020-02-06 ENCOUNTER — Other Ambulatory Visit: Payer: Self-pay

## 2020-02-06 ENCOUNTER — Other Ambulatory Visit: Payer: Self-pay | Admitting: Oncology

## 2020-02-06 ENCOUNTER — Encounter: Payer: Self-pay | Admitting: Oncology

## 2020-02-06 ENCOUNTER — Inpatient Hospital Stay: Payer: Medicare Other | Attending: Oncology | Admitting: Oncology

## 2020-02-06 VITALS — BP 134/68 | HR 90 | Temp 96.2°F | Resp 18 | Ht 69.5 in | Wt 152.7 lb

## 2020-02-06 DIAGNOSIS — D649 Anemia, unspecified: Secondary | ICD-10-CM | POA: Diagnosis not present

## 2020-02-06 DIAGNOSIS — Z5112 Encounter for antineoplastic immunotherapy: Secondary | ICD-10-CM | POA: Diagnosis present

## 2020-02-06 DIAGNOSIS — D72819 Decreased white blood cell count, unspecified: Secondary | ICD-10-CM | POA: Insufficient documentation

## 2020-02-06 DIAGNOSIS — Z79899 Other long term (current) drug therapy: Secondary | ICD-10-CM | POA: Diagnosis not present

## 2020-02-06 DIAGNOSIS — R300 Dysuria: Secondary | ICD-10-CM | POA: Insufficient documentation

## 2020-02-06 DIAGNOSIS — C679 Malignant neoplasm of bladder, unspecified: Secondary | ICD-10-CM | POA: Insufficient documentation

## 2020-02-06 DIAGNOSIS — Z7189 Other specified counseling: Secondary | ICD-10-CM | POA: Insufficient documentation

## 2020-02-06 MED ORDER — PROCHLORPERAZINE MALEATE 10 MG PO TABS: 10.0000 mg | ORAL_TABLET | Freq: Four times a day (QID) | ORAL | 0 refills | Status: DC | PRN

## 2020-02-06 NOTE — Progress Notes (Signed)
Hematology and Oncology Follow Up Visit  Douglas Wood [id number] 20-Wood-1933 86 y.o. 02/06/2020 8:15 AM Douglas Wood, MDShaw, Douglas May, MD   Principle Diagnosis: 86 year old man with bladder cancer diagnosed in 2018.  He was found to have T2N0 high-grade urothelial carcinoma.  He developed relapse in March 2021 with nonmuscle invasive disease.  Prior Therapy:  He S/P TURBT on April 14, 2017 under the care of Douglas Wood for a 5.5 cm tumor involving the right wall of the bladder.   Definitive therapy with radiation therapy and weekly carboplatin concluded in April 2019.  He is status post BCG treatment for 6 weeks completed in June of 2021.  Repeat cystoscopy in September 2021 continues to show residual tumor without muscle invasion.   Current therapy: Under consideration for additional therapy.  Interim History: Douglas Wood returns today for a follow-up visit.  Since the last visit, he underwent a repeat cystoscopy on January 03, 2020 under the care of Douglas Wood.  He found to have a tremor that was biopsy-proven to show high-grade poorly differentiated urothelial carcinoma invading into the lamina propria without muscle invasion indicating T1 disease.  He tolerated the procedure well and clinically reports no major complaints at this time.  He does report occasional dysuria associated with his radiation cystitis but no hematuria or fevers.  He denies any flank pain or difficulty breathing.  Continues to be active and attends activities of daily living as well as playing golf.       Medications: Unchanged on review. Current Outpatient Medications  Medication Sig Dispense Refill  . acetaminophen (TYLENOL) 325 MG tablet Take 650 mg by mouth every 6 (six) hours as needed for mild pain, moderate pain or headache.    . diazepam (VALIUM) 5 MG tablet Take 5 mg by mouth at bedtime as needed for anxiety.     . ibandronate (BONIVA) 150 MG tablet Take 150 mg by mouth every 30 (thirty)  days. Take in the morning with a full glass of water, on an empty stomach, and do not take anything else by mouth or lie down for the next 30 min.    . Meth-Hyo-M Bl-Na Phos-Ph Sal (URIBEL) 118 MG CAPS Take 118 capsules by mouth.    . Meth-Hyo-M Bl-Na Phos-Ph Sal (URIBEL) 118 MG CAPS Take 1 capsule (118 mg total) by mouth every 8 (eight) hours as needed (for dysuria). 20 capsule 11  . omeprazole (PRILOSEC) 20 MG capsule Take 20 mg by mouth every evening.     . silodosin (RAPAFLO) 8 MG CAPS capsule Take 8 mg by mouth every evening.    . simvastatin (ZOCOR) 10 MG tablet Take 10 mg by mouth every evening.  0  . traMADol (ULTRAM) 50 MG tablet Take 1 tablet (50 mg total) by mouth every 6 (six) hours as needed for moderate pain. Post-operatively 20 tablet 0   No current facility-administered medications for this visit.     Allergies:  No Known Allergies      Physical Exam:     Blood pressure 134/68, pulse 90, temperature (!) 96.2 F (35.7 C), temperature source Tympanic, resp. rate 18, height 5' 9.5" (1.765 m), weight 152 lb 11.2 oz (69.3 kg), SpO2 99 %.    ECOG: 0   General appearance: Comfortable appearing without any discomfort Head: Normocephalic without any trauma Oropharynx: Mucous membranes are moist and pink without any thrush or ulcers. Eyes: Pupils are equal and round reactive to light. Lymph nodes: No cervical, supraclavicular, inguinal or axillary  lymphadenopathy.   Heart:regular rate and rhythm.  S1 and S2 without leg edema. Lung: Clear without any rhonchi or wheezes.  No dullness to percussion. Abdomin: Soft, nontender, nondistended with good bowel sounds.  No hepatosplenomegaly. Musculoskeletal: No joint deformity or effusion.  Full range of motion noted. Neurological: No deficits noted on motor, sensory and deep tendon reflex exam. Skin: No petechial rash or dryness.  Appeared moist.          Lab Results: Lab Results  Component Value Date   WBC 5.5  11/08/2019   HGB 14.3 01/03/2020   HCT 42.0 01/03/2020   MCV 93.3 11/08/2019   PLT 270 11/08/2019     Chemistry      Component Value Date/Time   NA 144 01/03/2020 1103   K 4.2 01/03/2020 1103   CL 105 01/03/2020 1103   CO2 24 11/08/2019 1137   BUN 27 (H) 01/03/2020 1103   CREATININE 1.00 01/03/2020 1103   CREATININE 0.97 11/08/2019 1137      Component Value Date/Time   CALCIUM 9.3 11/08/2019 1137   ALKPHOS 72 11/08/2019 1137   AST 15 11/08/2019 1137   ALT 13 11/08/2019 1137   BILITOT 0.5 11/08/2019 1137         Impression and Plan:  86 year old man with:   1.    Bladder cancer diagnosed in 2018.  He initially presented with T2N0 high-grade urothelial carcinoma.  He developed relapsed disease with T1 disease that has been recurring despite resection and BCG treatment.  The natural course of T1 bladder cancer that has been BCG refractory and treatment options were reviewed.  Repeat BCG versus intravesicular chemotherapy with mitomycin or gemcitabine versus systemic therapy were reviewed.  The role of immunotherapy utilizing Pembrolizumab was discussed today in detail.  Complication associated with this treatment include nausea, fatigue, and immune mediated complications were reviewed after discussion today he is agreeable to proceed.  2.  Immune mediated complications: These include pneumonitis, colitis and thyroid disease will be monitored periodically.  3.  Antiemetics: Prescription for Compazine will be made available for him.  4.  Follow-up: We will be in the immediate future to start Pembrolizumab.   30  minutes were spent on reviewing his disease status, discussing treatment options and outlining future plan of care.     Douglas Button, MD 10/14/20218:15 AM

## 2020-02-06 NOTE — Progress Notes (Signed)
DISCONTINUE OFF PATHWAY REGIMEN - Bladder   [id number]:Carboplatin AUC=2:   Administer weekly:     Carboplatin   **Always confirm dose/schedule in your pharmacy ordering system**  REASON: Disease Progression PRIOR TREATMENT: Off Pathway: Carboplatin AUC=2 TREATMENT RESPONSE: Progressive Disease (PD)  START ON PATHWAY REGIMEN - Bladder     A cycle is every 21 days:     Pembrolizumab   **Always confirm dose/schedule in your pharmacy ordering system**  Patient Characteristics: Pre-Cystectomy or Nonsurgical Candidate (Clinical Staging), High-Grade cTa, cN0 or cTis/cT1, cN0, Refractory to Intravesicular BCG Therapeutic Status: Pre-Cystectomy or Nonsurgical Candidate (Clinical Staging) AJCC M Category: cM0 AJCC 8 Stage Grouping: I AJCC T Category: cT1 AJCC N Category: cN0 Intent of Therapy: Curative Intent, Discussed with Patient

## 2020-02-10 ENCOUNTER — Telehealth: Payer: Self-pay | Admitting: Oncology

## 2020-02-10 NOTE — Telephone Encounter (Signed)
Released last OV note to [facility] at [phone]   Release:  [id number]

## 2020-02-11 ENCOUNTER — Encounter: Payer: Self-pay | Admitting: Oncology

## 2020-02-13 ENCOUNTER — Inpatient Hospital Stay: Payer: Medicare Other

## 2020-02-13 ENCOUNTER — Other Ambulatory Visit: Payer: Self-pay

## 2020-02-13 VITALS — BP 133/94 | HR 82 | Temp 97.8°F | Resp 18

## 2020-02-13 DIAGNOSIS — Z5112 Encounter for antineoplastic immunotherapy: Secondary | ICD-10-CM | POA: Diagnosis not present

## 2020-02-13 DIAGNOSIS — C672 Malignant neoplasm of lateral wall of bladder: Secondary | ICD-10-CM

## 2020-02-13 DIAGNOSIS — C679 Malignant neoplasm of bladder, unspecified: Secondary | ICD-10-CM

## 2020-02-13 LAB — CBC WITH DIFFERENTIAL (CANCER CENTER ONLY)
Abs Immature Granulocytes: 0.01 10*3/uL (ref 0.00–0.07)
Basophils Absolute: 0 10*3/uL (ref 0.0–0.1)
Basophils Relative: 1 %
Eosinophils Absolute: 0.2 10*3/uL (ref 0.0–0.5)
Eosinophils Relative: 3 %
HCT: 35.1 % — ABNORMAL LOW (ref 39.0–52.0)
Hemoglobin: 11.1 g/dL — ABNORMAL LOW (ref 13.0–17.0)
Immature Granulocytes: 0 %
Lymphocytes Relative: 12 %
Lymphs Abs: 0.7 10*3/uL (ref 0.7–4.0)
MCH: 29.4 pg (ref 26.0–34.0)
MCHC: 31.6 g/dL (ref 30.0–36.0)
MCV: 92.9 fL (ref 80.0–100.0)
Monocytes Absolute: 0.8 10*3/uL (ref 0.1–1.0)
Monocytes Relative: 14 %
Neutro Abs: 3.9 10*3/uL (ref 1.7–7.7)
Neutrophils Relative %: 70 %
Platelet Count: 312 10*3/uL (ref 150–400)
RBC: 3.78 MIL/uL — ABNORMAL LOW (ref 4.22–5.81)
RDW: 13.9 % (ref 11.5–15.5)
WBC Count: 5.5 10*3/uL (ref 4.0–10.5)
nRBC: 0 % (ref 0.0–0.2)

## 2020-02-13 LAB — CMP (CANCER CENTER ONLY)
ALT: 18 U/L (ref 0–44)
AST: 16 U/L (ref 15–41)
Albumin: 3.2 g/dL — ABNORMAL LOW (ref 3.5–5.0)
Alkaline Phosphatase: 82 U/L (ref 38–126)
Anion gap: 7 (ref 5–15)
BUN: 33 mg/dL — ABNORMAL HIGH (ref 8–23)
CO2: 27 mmol/L (ref 22–32)
Calcium: 9.7 mg/dL (ref 8.9–10.3)
Chloride: 108 mmol/L (ref 98–111)
Creatinine: 1.19 mg/dL (ref 0.61–1.24)
GFR, Estimated: 59 mL/min — ABNORMAL LOW (ref >60)
Glucose, Bld: 112 mg/dL — ABNORMAL HIGH (ref 70–99)
Potassium: 4 mmol/L (ref 3.5–5.1)
Sodium: 142 mmol/L (ref 135–145)
Total Bilirubin: 0.2 mg/dL — ABNORMAL LOW (ref 0.3–1.2)
Total Protein: 7.2 g/dL (ref 6.5–8.1)

## 2020-02-13 LAB — TSH: TSH: 0.981 u[IU]/mL (ref 0.320–4.118)

## 2020-02-13 MED ORDER — SODIUM CHLORIDE 0.9% FLUSH
10.0000 mL | INTRAVENOUS | Status: DC | PRN
  Filled 2020-02-13: qty 10

## 2020-02-13 MED ORDER — SODIUM CHLORIDE 0.9 % IV SOLN
200.0000 mg | Freq: Once | INTRAVENOUS | Status: AC
  Administered 2020-02-13: 200 mg via INTRAVENOUS
  Filled 2020-02-13: qty 8

## 2020-02-13 MED ORDER — SODIUM CHLORIDE 0.9 % IV SOLN
Freq: Once | INTRAVENOUS | Status: AC
  Filled 2020-02-13: qty 250

## 2020-02-13 MED ORDER — HEPARIN SOD (PORK) LOCK FLUSH 100 UNIT/ML IV SOLN
500.0000 [IU] | Freq: Once | INTRAVENOUS | Status: DC | PRN
  Filled 2020-02-13: qty 5

## 2020-02-13 NOTE — Patient Instructions (Addendum)
[facility] Discharge Instructions for Patients Receiving Chemotherapy  Today you received the following chemotherapy agents pembrolizumab.  To help prevent nausea and vomiting after your treatment, we encourage you to take your nausea medication as directed.    If you develop nausea and vomiting that is not controlled by your nausea medication, call the clinic.   BELOW ARE SYMPTOMS THAT SHOULD BE REPORTED IMMEDIATELY:  *FEVER GREATER THAN 100.5 F  *CHILLS WITH OR WITHOUT FEVER  NAUSEA AND VOMITING THAT IS NOT CONTROLLED WITH YOUR NAUSEA MEDICATION  *UNUSUAL SHORTNESS OF BREATH  *UNUSUAL BRUISING OR BLEEDING  TENDERNESS IN MOUTH AND THROAT WITH OR WITHOUT PRESENCE OF ULCERS  *URINARY PROBLEMS  *BOWEL PROBLEMS  UNUSUAL RASH Items with * indicate a potential emergency and should be followed up as soon as possible.  Feel free to call the clinic should you have any questions or concerns. The clinic phone number is [phone].  Please show the [facility] at check-in to the Emergency Department and triage nurse.  Pembrolizumab injection What is this medicine? PEMBROLIZUMAB (pem broe liz ue mab) is a monoclonal antibody. It is used to treat certain types of cancer. This medicine may be used for other purposes; ask your health care provider or pharmacist if you have questions. COMMON BRAND NAME(S): Keytruda What should I tell my health care provider before I take this medicine? They need to know if you have any of these conditions:  diabetes  immune system problems  inflammatory bowel disease  liver disease  lung or breathing disease  lupus  received or scheduled to receive an organ transplant or a stem-cell transplant that uses donor stem cells  an unusual or allergic reaction to pembrolizumab, other medicines, foods, dyes, or preservatives  pregnant or trying to get pregnant  breast-feeding How should I use this medicine? This medicine  is for infusion into a vein. It is given by a health care professional in a hospital or clinic setting. A special MedGuide will be given to you before each treatment. Be sure to read this information carefully each time. Talk to your pediatrician regarding the use of this medicine in children. While this drug may be prescribed for children as young as 6 months for selected conditions, precautions do apply. Overdosage: If you think you have taken too much of this medicine contact a poison control center or emergency room at once. NOTE: This medicine is only for you. Do not share this medicine with others. What if I miss a dose? It is important not to miss your dose. Call your doctor or health care professional if you are unable to keep an appointment. What may interact with this medicine? Interactions have not been studied. Give your health care provider a list of all the medicines, herbs, non-prescription drugs, or dietary supplements you use. Also tell them if you smoke, drink alcohol, or use illegal drugs. Some items may interact with your medicine. This list may not describe all possible interactions. Give your health care provider a list of all the medicines, herbs, non-prescription drugs, or dietary supplements you use. Also tell them if you smoke, drink alcohol, or use illegal drugs. Some items may interact with your medicine. What should I watch for while using this medicine? Your condition will be monitored carefully while you are receiving this medicine. You may need blood work done while you are taking this medicine. Do not become pregnant while taking this medicine or for 4 months after stopping it. Women  should inform their doctor if they wish to become pregnant or think they might be pregnant. There is a potential for serious side effects to an unborn child. Talk to your health care professional or pharmacist for more information. Do not breast-feed an infant while taking this medicine or  for 4 months after the last dose. What side effects may I notice from receiving this medicine? Side effects that you should report to your doctor or health care professional as soon as possible:  allergic reactions like skin rash, itching or hives, swelling of the face, lips, or tongue  bloody or black, tarry  breathing problems  changes in vision  chest pain  chills  confusion  constipation  cough  diarrhea  dizziness or feeling faint or lightheaded  fast or irregular heartbeat  fever  flushing  joint pain  low blood counts - this medicine may decrease the number of white blood cells, red blood cells and platelets. You may be at increased risk for infections and bleeding.  muscle pain  muscle weakness  pain, tingling, numbness in the hands or feet  persistent headache  redness, blistering, peeling or loosening of the skin, including inside the mouth  signs and symptoms of high blood sugar such as dizziness; dry mouth; dry skin; fruity breath; nausea; stomach pain; increased hunger or thirst; increased urination  signs and symptoms of kidney injury like trouble passing urine or change in the amount of urine  signs and symptoms of liver injury like dark urine, light-colored stools, loss of appetite, nausea, right upper belly pain, yellowing of the eyes or skin  sweating  swollen lymph nodes  weight loss Side effects that usually do not require medical attention (report to your doctor or health care professional if they continue or are bothersome):  decreased appetite  hair loss  muscle pain  tiredness This list may not describe all possible side effects. Call your doctor for medical advice about side effects. You may report side effects to FDA at 1-800-FDA-1088. Where should I keep my medicine? This drug is given in a hospital or clinic and will not be stored at home. NOTE: This sheet is a summary. It may not cover all possible information. If you  have questions about this medicine, talk to your doctor, pharmacist, or health care provider.  2020 Elsevier/Gold Standard (2019-02-15 18:07:58)

## 2020-03-06 ENCOUNTER — Other Ambulatory Visit: Payer: Self-pay

## 2020-03-06 ENCOUNTER — Inpatient Hospital Stay: Payer: Medicare Other | Admitting: Oncology

## 2020-03-06 ENCOUNTER — Inpatient Hospital Stay: Payer: Medicare Other

## 2020-03-06 ENCOUNTER — Inpatient Hospital Stay: Payer: Medicare Other | Attending: Oncology

## 2020-03-06 VITALS — BP 150/77 | HR 85 | Temp 97.0°F | Resp 18 | Ht 69.5 in | Wt 152.0 lb

## 2020-03-06 DIAGNOSIS — D72819 Decreased white blood cell count, unspecified: Secondary | ICD-10-CM | POA: Diagnosis not present

## 2020-03-06 DIAGNOSIS — Z79899 Other long term (current) drug therapy: Secondary | ICD-10-CM | POA: Diagnosis not present

## 2020-03-06 DIAGNOSIS — C672 Malignant neoplasm of lateral wall of bladder: Secondary | ICD-10-CM

## 2020-03-06 DIAGNOSIS — Z5112 Encounter for antineoplastic immunotherapy: Secondary | ICD-10-CM | POA: Insufficient documentation

## 2020-03-06 DIAGNOSIS — D649 Anemia, unspecified: Secondary | ICD-10-CM | POA: Insufficient documentation

## 2020-03-06 DIAGNOSIS — R300 Dysuria: Secondary | ICD-10-CM | POA: Insufficient documentation

## 2020-03-06 DIAGNOSIS — C679 Malignant neoplasm of bladder, unspecified: Secondary | ICD-10-CM | POA: Diagnosis present

## 2020-03-06 LAB — CMP (CANCER CENTER ONLY)
ALT: 12 U/L (ref 0–44)
AST: 15 U/L (ref 15–41)
Albumin: 3.7 g/dL (ref 3.5–5.0)
Alkaline Phosphatase: 80 U/L (ref 38–126)
Anion gap: 7 (ref 5–15)
BUN: 35 mg/dL — ABNORMAL HIGH (ref 8–23)
CO2: 23 mmol/L (ref 22–32)
Calcium: 9.5 mg/dL (ref 8.9–10.3)
Chloride: 112 mmol/L — ABNORMAL HIGH (ref 98–111)
Creatinine: 1.84 mg/dL — ABNORMAL HIGH (ref 0.61–1.24)
GFR, Estimated: 35 mL/min — ABNORMAL LOW (ref >60)
Glucose, Bld: 108 mg/dL — ABNORMAL HIGH (ref 70–99)
Potassium: 4.7 mmol/L (ref 3.5–5.1)
Sodium: 142 mmol/L (ref 135–145)
Total Bilirubin: 0.5 mg/dL (ref 0.3–1.2)
Total Protein: 7.6 g/dL (ref 6.5–8.1)

## 2020-03-06 LAB — CBC WITH DIFFERENTIAL (CANCER CENTER ONLY)
Abs Immature Granulocytes: 0.01 10*3/uL (ref 0.00–0.07)
Basophils Absolute: 0 10*3/uL (ref 0.0–0.1)
Basophils Relative: 1 %
Eosinophils Absolute: 0.2 10*3/uL (ref 0.0–0.5)
Eosinophils Relative: 4 %
HCT: 34.8 % — ABNORMAL LOW (ref 39.0–52.0)
Hemoglobin: 11.1 g/dL — ABNORMAL LOW (ref 13.0–17.0)
Immature Granulocytes: 0 %
Lymphocytes Relative: 14 %
Lymphs Abs: 0.7 10*3/uL (ref 0.7–4.0)
MCH: 29.7 pg (ref 26.0–34.0)
MCHC: 31.9 g/dL (ref 30.0–36.0)
MCV: 93 fL (ref 80.0–100.0)
Monocytes Absolute: 0.8 10*3/uL (ref 0.1–1.0)
Monocytes Relative: 16 %
Neutro Abs: 3.2 10*3/uL (ref 1.7–7.7)
Neutrophils Relative %: 65 %
Platelet Count: 269 10*3/uL (ref 150–400)
RBC: 3.74 MIL/uL — ABNORMAL LOW (ref 4.22–5.81)
RDW: 14.7 % (ref 11.5–15.5)
WBC Count: 4.9 10*3/uL (ref 4.0–10.5)
nRBC: 0 % (ref 0.0–0.2)

## 2020-03-06 LAB — TSH: TSH: 1.22 u[IU]/mL (ref 0.320–4.118)

## 2020-03-06 MED ORDER — SODIUM CHLORIDE 0.9 % IV SOLN
200.0000 mg | Freq: Once | INTRAVENOUS | Status: AC
  Administered 2020-03-06: 200 mg via INTRAVENOUS
  Filled 2020-03-06: qty 8

## 2020-03-06 MED ORDER — SODIUM CHLORIDE 0.9 % IV SOLN
Freq: Once | INTRAVENOUS | Status: AC
  Filled 2020-03-06: qty 250

## 2020-03-06 NOTE — Progress Notes (Signed)
Per Dr. Alen Blew, [place] to treat with elevated serum creatinine.

## 2020-03-06 NOTE — Patient Instructions (Signed)
[facility] Discharge Instructions for Patients Receiving Chemotherapy  Today you received the following chemotherapy agents: Beryle Flock  To help prevent nausea and vomiting after your treatment, we encourage you to take your nausea medication as directed.    If you develop nausea and vomiting that is not controlled by your nausea medication, call the clinic.   BELOW ARE SYMPTOMS THAT SHOULD BE REPORTED IMMEDIATELY:  *FEVER GREATER THAN 100.5 F  *CHILLS WITH OR WITHOUT FEVER  NAUSEA AND VOMITING THAT IS NOT CONTROLLED WITH YOUR NAUSEA MEDICATION  *UNUSUAL SHORTNESS OF BREATH  *UNUSUAL BRUISING OR BLEEDING  TENDERNESS IN MOUTH AND THROAT WITH OR WITHOUT PRESENCE OF ULCERS  *URINARY PROBLEMS  *BOWEL PROBLEMS  UNUSUAL RASH Items with * indicate a potential emergency and should be followed up as soon as possible.  Feel free to call the clinic should you have any questions or concerns. The clinic phone number is [phone].  Please show the [facility] at check-in to the Emergency Department and triage nurse.

## 2020-03-06 NOTE — Progress Notes (Signed)
Hematology and Oncology Follow Up Visit  Douglas Wood [id number] [birth year] 86 y.o. 03/06/2020 12:36 PM [name], MDShaw, [name], MD   Principle Diagnosis: 86 year old man with nonmuscle invasive bladder cancer relapse in March 2021.  He was found to have T2N0 high-grade urothelial carcinoma of the bladder diagnosed in 2018.    Prior Therapy:  He S/P TURBT on April 14, 2017 under the care of Dr. Karsten Ro for a 5.5 cm tumor involving the right wall of the bladder.   Definitive therapy with radiation therapy and weekly carboplatin concluded in April 2019.  He is status post BCG treatment for 6 weeks completed in June of 2021.  Repeat cystoscopy in September 2021 continues to show residual tumor without muscle invasion.   Current therapy: Pembrolizumab 200 mg IV every 3 weeks started on February 13, 2020.  Is here for cycle 2 of therapy.  Interim History: [name] presents today for repeat evaluation.  Since the last visit, he has started Pembrolizumab without any major complications.  He did report some occasional pruritus that is overall manageable.  He denies any nausea, vomiting or abdominal pain.  He denies any diarrhea or worsening shortness of breath.  His performance status quality of life remains unchanged.      Medications: Updated on review. Current Outpatient Medications  Medication Sig Dispense Refill  . acetaminophen (TYLENOL) 325 MG tablet Take 650 mg by mouth every 6 (six) hours as needed for mild pain, moderate pain or headache.    . diazepam (VALIUM) 5 MG tablet Take 5 mg by mouth at bedtime as needed for anxiety.     . ibandronate (BONIVA) 150 MG tablet Take 150 mg by mouth every 30 (thirty) days. Take in the morning with a full glass of water, on an empty stomach, and do not take anything else by mouth or lie down for the next 30 min.    . Meth-Hyo-M Bl-Na Phos-Ph Sal (URIBEL) 118 MG CAPS Take 118 capsules by mouth.    . Meth-Hyo-M Bl-Na Phos-Ph Sal  (URIBEL) 118 MG CAPS Take 1 capsule (118 mg total) by mouth every 8 (eight) hours as needed (for dysuria). 20 capsule 11  . omeprazole (PRILOSEC) 20 MG capsule Take 20 mg by mouth every evening.     . prochlorperazine (COMPAZINE) 10 MG tablet Take 1 tablet (10 mg total) by mouth every 6 (six) hours as needed for nausea or vomiting. 30 tablet 0  . silodosin (RAPAFLO) 8 MG CAPS capsule Take 8 mg by mouth every evening.    . simvastatin (ZOCOR) 10 MG tablet Take 10 mg by mouth every evening.  0  . traMADol (ULTRAM) 50 MG tablet Take 1 tablet (50 mg total) by mouth every 6 (six) hours as needed for moderate pain. Post-operatively 20 tablet 0   No current facility-administered medications for this visit.     Allergies:  No Known Allergies      Physical Exam:    Blood pressure (!) 150/77, pulse 85, temperature (!) 97 F (36.1 C), temperature source Tympanic, resp. rate 18, height 5' 9.5" (1.765 m), weight 152 lb (68.9 kg), SpO2 99 %.      ECOG: 0    General appearance: Alert, awake without any distress. Head: Atraumatic without abnormalities Oropharynx: Without any thrush or ulcers. Eyes: No scleral icterus. Lymph nodes: No lymphadenopathy noted in the cervical, supraclavicular, or axillary nodes Heart:regular rate and rhythm, without any murmurs or gallops.   Lung: Clear to auscultation without any rhonchi,  wheezes or dullness to percussion. Abdomin: Soft, nontender without any shifting dullness or ascites. Musculoskeletal: No clubbing or cyanosis. Neurological: No motor or sensory deficits. Skin: No rashes or lesions.          Lab Results: Lab Results  Component Value Date   WBC 5.5 02/13/2020   HGB 11.1 (L) 02/13/2020   HCT 35.1 (L) 02/13/2020   MCV 92.9 02/13/2020   PLT 312 02/13/2020     Chemistry      Component Value Date/Time   NA 142 02/13/2020 1428   K 4.0 02/13/2020 1428   CL 108 02/13/2020 1428   CO2 27 02/13/2020 1428   BUN 33 (H) 02/13/2020  1428   CREATININE 1.19 02/13/2020 1428      Component Value Date/Time   CALCIUM 9.7 02/13/2020 1428   ALKPHOS 82 02/13/2020 1428   AST 16 02/13/2020 1428   ALT 18 02/13/2020 1428   BILITOT 0.2 (L) 02/13/2020 1428         Impression and Plan:  86 year old man with:   1.    T1 bladder cancer with relapsed disease diagnosed in March 2021.  He developed BCG refractory disease.    He is currently receiving Pembrolizumab without any major complaints at this time.  Risks and benefits of continuing this treatment long-term were discussed.  Potential complications that include GI toxicity, dermatological toxicity as well as autoimmune mediated complications.  I recommended continuing these treatments for the time being and evaluate the status by repeat cystoscopy.  2.  Immune mediated complications: Long-term complications will continue to be assessed.  These complications including pneumonitis, arthritis, hypothyroid among others.  He is agreeable to proceed.  3.  Antiemetics: No nausea or vomiting reported.  Compazine is available to him at this time.  4.  Follow-up: In 3 weeks for the next cycle of therapy.   30  minutes were dedicated to this encounter.  The time was spent on reviewing disease status, addressing complications related to his cancer and cancer therapy.     Zola Button, MD 11/12/202112:36 PM

## 2020-03-27 ENCOUNTER — Inpatient Hospital Stay: Payer: Medicare Other | Attending: Oncology

## 2020-03-27 ENCOUNTER — Inpatient Hospital Stay: Payer: Medicare Other

## 2020-03-27 ENCOUNTER — Other Ambulatory Visit: Payer: Self-pay

## 2020-03-27 ENCOUNTER — Inpatient Hospital Stay: Payer: Medicare Other | Admitting: Oncology

## 2020-03-27 VITALS — BP 124/68 | HR 85 | Resp 18 | Ht 65.9 in | Wt 148.5 lb

## 2020-03-27 DIAGNOSIS — Z79899 Other long term (current) drug therapy: Secondary | ICD-10-CM | POA: Insufficient documentation

## 2020-03-27 DIAGNOSIS — C672 Malignant neoplasm of lateral wall of bladder: Secondary | ICD-10-CM

## 2020-03-27 DIAGNOSIS — Z923 Personal history of irradiation: Secondary | ICD-10-CM | POA: Diagnosis not present

## 2020-03-27 DIAGNOSIS — C679 Malignant neoplasm of bladder, unspecified: Secondary | ICD-10-CM | POA: Diagnosis present

## 2020-03-27 DIAGNOSIS — Z5112 Encounter for antineoplastic immunotherapy: Secondary | ICD-10-CM | POA: Insufficient documentation

## 2020-03-27 LAB — CMP (CANCER CENTER ONLY)
ALT: 12 U/L (ref 0–44)
AST: 16 U/L (ref 15–41)
Albumin: 3.5 g/dL (ref 3.5–5.0)
Alkaline Phosphatase: 82 U/L (ref 38–126)
Anion gap: 10 (ref 5–15)
BUN: 35 mg/dL — ABNORMAL HIGH (ref 8–23)
CO2: 19 mmol/L — ABNORMAL LOW (ref 22–32)
Calcium: 9.8 mg/dL (ref 8.9–10.3)
Chloride: 112 mmol/L — ABNORMAL HIGH (ref 98–111)
Creatinine: 1.96 mg/dL — ABNORMAL HIGH (ref 0.61–1.24)
GFR, Estimated: 33 mL/min — ABNORMAL LOW (ref >60)
Glucose, Bld: 103 mg/dL — ABNORMAL HIGH (ref 70–99)
Potassium: 4.5 mmol/L (ref 3.5–5.1)
Sodium: 141 mmol/L (ref 135–145)
Total Bilirubin: 0.2 mg/dL — ABNORMAL LOW (ref 0.3–1.2)
Total Protein: 7.5 g/dL (ref 6.5–8.1)

## 2020-03-27 LAB — CBC WITH DIFFERENTIAL (CANCER CENTER ONLY)
Abs Immature Granulocytes: 0.01 10*3/uL (ref 0.00–0.07)
Basophils Absolute: 0 10*3/uL (ref 0.0–0.1)
Basophils Relative: 1 %
Eosinophils Absolute: 0.1 10*3/uL (ref 0.0–0.5)
Eosinophils Relative: 2 %
HCT: 33.5 % — ABNORMAL LOW (ref 39.0–52.0)
Hemoglobin: 10.9 g/dL — ABNORMAL LOW (ref 13.0–17.0)
Immature Granulocytes: 0 %
Lymphocytes Relative: 13 %
Lymphs Abs: 0.8 10*3/uL (ref 0.7–4.0)
MCH: 29.7 pg (ref 26.0–34.0)
MCHC: 32.5 g/dL (ref 30.0–36.0)
MCV: 91.3 fL (ref 80.0–100.0)
Monocytes Absolute: 0.8 10*3/uL (ref 0.1–1.0)
Monocytes Relative: 13 %
Neutro Abs: 4 10*3/uL (ref 1.7–7.7)
Neutrophils Relative %: 71 %
Platelet Count: 334 10*3/uL (ref 150–400)
RBC: 3.67 MIL/uL — ABNORMAL LOW (ref 4.22–5.81)
RDW: 14.7 % (ref 11.5–15.5)
WBC Count: 5.7 10*3/uL (ref 4.0–10.5)
nRBC: 0 % (ref 0.0–0.2)

## 2020-03-27 LAB — TSH: TSH: 1.549 u[IU]/mL (ref 0.320–4.118)

## 2020-03-27 MED ORDER — SODIUM CHLORIDE 0.9 % IV SOLN
200.0000 mg | Freq: Once | INTRAVENOUS | Status: AC
  Administered 2020-03-27: 200 mg via INTRAVENOUS
  Filled 2020-03-27: qty 8

## 2020-03-27 MED ORDER — SODIUM CHLORIDE 0.9 % IV SOLN
Freq: Once | INTRAVENOUS | Status: AC
  Filled 2020-03-27: qty 250

## 2020-03-27 NOTE — Progress Notes (Signed)
Per Dr. Alen Blew - okay to treat with elevated creatinine today.

## 2020-03-27 NOTE — Progress Notes (Signed)
Hematology and Oncology Follow Up Visit  Douglas Wood [id number] 08/26/1931 86 y.o. 03/27/2020 11:27 AM [name], MDShaw, [name], MD   Principle Diagnosis: 86 year old man with bladder cancer diagnosed in 2018. He was found to have T2N0 high-grade urothelial carcinoma. He developed nonmuscle invasive component in September 2021 that is BCG refractory.   Prior Therapy:  He S/P TURBT on April 14, 2017 under the care of Dr. Karsten Ro for a 5.5 cm tumor involving the right wall of the bladder.   Definitive therapy with radiation therapy and weekly carboplatin concluded in April 2019.  He is status post BCG treatment for 6 weeks completed in June of 2021.  Repeat cystoscopy in September 2021 continues to show residual tumor without muscle invasion.   Current therapy: Pembrolizumab 200 mg IV every 3 weeks started on February 13, 2020. He is here for cycle 3 of therapy.  Interim History: Mr. Douglas Wood is here for repeat evaluation. Since the last visit, he reports no major changes in his health.  He does have some element of dermatitis which is chronic in nature but has been recently exacerbated with Pembrolizumab.  He does use topical steroid cream which helps his symptoms.  He does report symptoms of incontinence at nighttime but overall continues to be active and enjoys excellent quality of life include playing golf periodically.  He denies any shortness of breath or difficulty breathing.  He denies any worsening bone pain.     Medications: Unchanged on review. Current Outpatient Medications  Medication Sig Dispense Refill  . acetaminophen (TYLENOL) 325 MG tablet Take 650 mg by mouth every 6 (six) hours as needed for mild pain, moderate pain or headache.    . diazepam (VALIUM) 5 MG tablet Take 5 mg by mouth at bedtime as needed for anxiety.     . ibandronate (BONIVA) 150 MG tablet Take 150 mg by mouth every 30 (thirty) days. Take in the morning with a full glass of water, on an  empty stomach, and do not take anything else by mouth or lie down for the next 30 min.    . Meth-Hyo-M Bl-Na Phos-Ph Sal (URIBEL) 118 MG CAPS Take 118 capsules by mouth.    . Meth-Hyo-M Bl-Na Phos-Ph Sal (URIBEL) 118 MG CAPS Take 1 capsule (118 mg total) by mouth every 8 (eight) hours as needed (for dysuria). 20 capsule 11  . omeprazole (PRILOSEC) 20 MG capsule Take 20 mg by mouth every evening.     . prochlorperazine (COMPAZINE) 10 MG tablet Take 1 tablet (10 mg total) by mouth every 6 (six) hours as needed for nausea or vomiting. 30 tablet 0  . silodosin (RAPAFLO) 8 MG CAPS capsule Take 8 mg by mouth every evening.    . simvastatin (ZOCOR) 10 MG tablet Take 10 mg by mouth every evening.  0  . traMADol (ULTRAM) 50 MG tablet Take 1 tablet (50 mg total) by mouth every 6 (six) hours as needed for moderate pain. Post-operatively 20 tablet 0   No current facility-administered medications for this visit.     Allergies:  No Known Allergies      Physical Exam:     Blood pressure 124/68, pulse 85, resp. rate 18, height 5' 5.9" (1.674 m), weight 148 lb 8 oz (67.4 kg), SpO2 100 %.      ECOG: 0    General appearance: Comfortable appearing without any discomfort Head: Normocephalic without any trauma Oropharynx: Mucous membranes are moist and pink without any thrush or ulcers. Eyes:  Pupils are equal and round reactive to light. Lymph nodes: No cervical, supraclavicular, inguinal or axillary lymphadenopathy.   Heart:regular rate and rhythm.  S1 and S2 without leg edema. Lung: Clear without any rhonchi or wheezes.  No dullness to percussion. Abdomin: Soft, nontender, nondistended with good bowel sounds.  No hepatosplenomegaly. Musculoskeletal: No joint deformity or effusion.  Full range of motion noted. Neurological: No deficits noted on motor, sensory and deep tendon reflex exam. Skin: No petechial rash or dryness.  Appeared moist.            Lab Results: Lab Results   Component Value Date   WBC 4.9 03/06/2020   HGB 11.1 (L) 03/06/2020   HCT 34.8 (L) 03/06/2020   MCV 93.0 03/06/2020   PLT 269 03/06/2020     Chemistry      Component Value Date/Time   NA 142 03/06/2020 1234   K 4.7 03/06/2020 1234   CL 112 (H) 03/06/2020 1234   CO2 23 03/06/2020 1234   BUN 35 (H) 03/06/2020 1234   CREATININE 1.84 (H) 03/06/2020 1234      Component Value Date/Time   CALCIUM 9.5 03/06/2020 1234   ALKPHOS 80 03/06/2020 1234   AST 15 03/06/2020 1234   ALT 12 03/06/2020 1234   BILITOT 0.5 03/06/2020 1234         Impression and Plan:  86 year old man with:   1.   BCG refractory T1 bladder cancer with relapsed disease documented in September 2021.  He continues to tolerate Pembrolizumab without any major complaints at this time. Risks and benefits of continuing this treatment long-term were reviewed. Potential complications including immune mediated issues, GI toxicity and dermatological toxicities were discussed.  He is agreeable to continue at this time.  He is scheduled to have a repeat cystoscopy in the near future to assess treatment response.  Alternative treatment options including intravesicular treatment with BCG or different chemotherapy agents also reviewed at this time.  This will be deferred if he has progression of disease on Pembrolizumab.  The duration of therapy has at this time would be indefinite unless he has progression of disease or complete response.   2.  Immune mediated complications: Complications such as pneumonitis, colitis, hepatitis and thyroid disease were reiterated.  3.  Antiemetics: Compazine is available to him without any nausea or vomiting.  4.  Follow-up: In 3 weeks for repeat follow-up   30  minutes were spent on this visit. The time was dedicated to reviewing disease status, outlining complications related to his cancer and cancer therapy.     Zola Button, MD 12/3/202111:27 AM

## 2020-03-27 NOTE — Progress Notes (Signed)
Patient discharged from the [facility] in stable condition and with no signs of acute distress.    

## 2020-04-14 ENCOUNTER — Inpatient Hospital Stay (HOSPITAL_BASED_OUTPATIENT_CLINIC_OR_DEPARTMENT_OTHER)
Admission: EM | Admit: 2020-04-14 | Discharge: 2020-04-17 | DRG: 871 | Disposition: A | Discharge status: Hospice | Payer: Medicare Other | Attending: Internal Medicine | Admitting: Internal Medicine

## 2020-04-14 ENCOUNTER — Emergency Department (HOSPITAL_BASED_OUTPATIENT_CLINIC_OR_DEPARTMENT_OTHER): Payer: Medicare Other

## 2020-04-14 ENCOUNTER — Encounter (HOSPITAL_BASED_OUTPATIENT_CLINIC_OR_DEPARTMENT_OTHER): Payer: Self-pay | Admitting: *Deleted

## 2020-04-14 ENCOUNTER — Other Ambulatory Visit: Payer: Self-pay

## 2020-04-14 DIAGNOSIS — Z87891 Personal history of nicotine dependence: Secondary | ICD-10-CM

## 2020-04-14 DIAGNOSIS — N1339 Other hydronephrosis: Secondary | ICD-10-CM

## 2020-04-14 DIAGNOSIS — N12 Tubulo-interstitial nephritis, not specified as acute or chronic: Secondary | ICD-10-CM

## 2020-04-14 DIAGNOSIS — E872 Acidosis: Secondary | ICD-10-CM | POA: Diagnosis present

## 2020-04-14 DIAGNOSIS — E875 Hyperkalemia: Secondary | ICD-10-CM

## 2020-04-14 DIAGNOSIS — Y842 Radiological procedure and radiotherapy as the cause of abnormal reaction of the patient, or of later complication, without mention of misadventure at the time of the procedure: Secondary | ICD-10-CM | POA: Diagnosis present

## 2020-04-14 DIAGNOSIS — Z8052 Family history of malignant neoplasm of bladder: Secondary | ICD-10-CM

## 2020-04-14 DIAGNOSIS — Z515 Encounter for palliative care: Secondary | ICD-10-CM

## 2020-04-14 DIAGNOSIS — D696 Thrombocytopenia, unspecified: Secondary | ICD-10-CM | POA: Diagnosis present

## 2020-04-14 DIAGNOSIS — G893 Neoplasm related pain (acute) (chronic): Secondary | ICD-10-CM | POA: Diagnosis present

## 2020-04-14 DIAGNOSIS — Z8042 Family history of malignant neoplasm of prostate: Secondary | ICD-10-CM

## 2020-04-14 DIAGNOSIS — N179 Acute kidney failure, unspecified: Secondary | ICD-10-CM

## 2020-04-14 DIAGNOSIS — N136 Pyonephrosis: Secondary | ICD-10-CM | POA: Diagnosis present

## 2020-04-14 DIAGNOSIS — E871 Hypo-osmolality and hyponatremia: Secondary | ICD-10-CM | POA: Diagnosis present

## 2020-04-14 DIAGNOSIS — Z85828 Personal history of other malignant neoplasm of skin: Secondary | ICD-10-CM

## 2020-04-14 DIAGNOSIS — N3041 Irradiation cystitis with hematuria: Secondary | ICD-10-CM | POA: Diagnosis present

## 2020-04-14 DIAGNOSIS — C672 Malignant neoplasm of lateral wall of bladder: Secondary | ICD-10-CM | POA: Diagnosis present

## 2020-04-14 DIAGNOSIS — Z9221 Personal history of antineoplastic chemotherapy: Secondary | ICD-10-CM

## 2020-04-14 DIAGNOSIS — A419 Sepsis, unspecified organism: Secondary | ICD-10-CM

## 2020-04-14 DIAGNOSIS — K219 Gastro-esophageal reflux disease without esophagitis: Secondary | ICD-10-CM | POA: Diagnosis present

## 2020-04-14 DIAGNOSIS — A4151 Sepsis due to Escherichia coli [E. coli]: Principal | ICD-10-CM | POA: Diagnosis present

## 2020-04-14 DIAGNOSIS — Z66 Do not resuscitate: Secondary | ICD-10-CM | POA: Diagnosis present

## 2020-04-14 DIAGNOSIS — D630 Anemia in neoplastic disease: Secondary | ICD-10-CM | POA: Diagnosis present

## 2020-04-14 DIAGNOSIS — E785 Hyperlipidemia, unspecified: Secondary | ICD-10-CM | POA: Diagnosis present

## 2020-04-14 DIAGNOSIS — R32 Unspecified urinary incontinence: Secondary | ICD-10-CM | POA: Diagnosis present

## 2020-04-14 DIAGNOSIS — Z20822 Contact with and (suspected) exposure to covid-19: Secondary | ICD-10-CM | POA: Diagnosis present

## 2020-04-14 DIAGNOSIS — M8580 Other specified disorders of bone density and structure, unspecified site: Secondary | ICD-10-CM | POA: Diagnosis present

## 2020-04-14 DIAGNOSIS — N1330 Unspecified hydronephrosis: Secondary | ICD-10-CM

## 2020-04-14 DIAGNOSIS — M19042 Primary osteoarthritis, left hand: Secondary | ICD-10-CM | POA: Diagnosis present

## 2020-04-14 DIAGNOSIS — Z1612 Extended spectrum beta lactamase (ESBL) resistance: Secondary | ICD-10-CM | POA: Diagnosis present

## 2020-04-14 DIAGNOSIS — R6520 Severe sepsis without septic shock: Secondary | ICD-10-CM | POA: Diagnosis present

## 2020-04-14 DIAGNOSIS — D72829 Elevated white blood cell count, unspecified: Secondary | ICD-10-CM | POA: Diagnosis present

## 2020-04-14 DIAGNOSIS — Z79899 Other long term (current) drug therapy: Secondary | ICD-10-CM

## 2020-04-14 DIAGNOSIS — Z923 Personal history of irradiation: Secondary | ICD-10-CM

## 2020-04-14 DIAGNOSIS — M19041 Primary osteoarthritis, right hand: Secondary | ICD-10-CM | POA: Diagnosis present

## 2020-04-14 DIAGNOSIS — Z87442 Personal history of urinary calculi: Secondary | ICD-10-CM

## 2020-04-14 DIAGNOSIS — Q600 Renal agenesis, unilateral: Secondary | ICD-10-CM

## 2020-04-14 DIAGNOSIS — N170 Acute kidney failure with tubular necrosis: Secondary | ICD-10-CM | POA: Diagnosis present

## 2020-04-14 LAB — URINALYSIS, ROUTINE W REFLEX MICROSCOPIC
Bilirubin Urine: NEGATIVE
Glucose, UA: NEGATIVE mg/dL
Ketones, ur: NEGATIVE mg/dL
Nitrite: NEGATIVE
Protein, ur: >300 mg/dL — AB
Specific Gravity, Urine: 1.015 (ref 1.005–1.030)
pH: 6 (ref 5.0–8.0)

## 2020-04-14 LAB — COMPREHENSIVE METABOLIC PANEL
ALT: 43 U/L (ref 0–44)
AST: 45 U/L — ABNORMAL HIGH (ref 15–41)
Albumin: 2.8 g/dL — ABNORMAL LOW (ref 3.5–5.0)
Alkaline Phosphatase: 182 U/L — ABNORMAL HIGH (ref 38–126)
Anion gap: 20 — ABNORMAL HIGH (ref 5–15)
BUN: 119 mg/dL — ABNORMAL HIGH (ref 8–23)
CO2: 11 mmol/L — ABNORMAL LOW (ref 22–32)
Calcium: 8.2 mg/dL — ABNORMAL LOW (ref 8.9–10.3)
Chloride: 98 mmol/L (ref 98–111)
Creatinine, Ser: 6.17 mg/dL — ABNORMAL HIGH (ref 0.61–1.24)
GFR, Estimated: 8 mL/min — ABNORMAL LOW (ref >60)
Glucose, Bld: 110 mg/dL — ABNORMAL HIGH (ref 70–99)
Potassium: 5.7 mmol/L — ABNORMAL HIGH (ref 3.5–5.1)
Sodium: 129 mmol/L — ABNORMAL LOW (ref 135–145)
Total Bilirubin: 1 mg/dL (ref 0.3–1.2)
Total Protein: 7.6 g/dL (ref 6.5–8.1)

## 2020-04-14 LAB — URINALYSIS, MICROSCOPIC (REFLEX): WBC, UA: >50 WBC/hpf (ref 0–5)

## 2020-04-14 LAB — CBC WITH DIFFERENTIAL/PLATELET
Abs Immature Granulocytes: 0.87 10*3/uL — ABNORMAL HIGH (ref 0.00–0.07)
Basophils Absolute: 0 10*3/uL (ref 0.0–0.1)
Basophils Relative: 0 %
Eosinophils Absolute: 0 10*3/uL (ref 0.0–0.5)
Eosinophils Relative: 0 %
HCT: 35.4 % — ABNORMAL LOW (ref 39.0–52.0)
Hemoglobin: 12.2 g/dL — ABNORMAL LOW (ref 13.0–17.0)
Immature Granulocytes: 4 %
Lymphocytes Relative: 2 %
Lymphs Abs: 0.3 10*3/uL — ABNORMAL LOW (ref 0.7–4.0)
MCH: 29.6 pg (ref 26.0–34.0)
MCHC: 34.5 g/dL (ref 30.0–36.0)
MCV: 85.9 fL (ref 80.0–100.0)
Monocytes Absolute: 1 10*3/uL (ref 0.1–1.0)
Monocytes Relative: 5 %
Neutro Abs: 18.4 10*3/uL — ABNORMAL HIGH (ref 1.7–7.7)
Neutrophils Relative %: 89 %
Platelets: 128 10*3/uL — ABNORMAL LOW (ref 150–400)
RBC: 4.12 MIL/uL — ABNORMAL LOW (ref 4.22–5.81)
RDW: 15.7 % — ABNORMAL HIGH (ref 11.5–15.5)
Smear Review: NORMAL
WBC: 20.6 10*3/uL — ABNORMAL HIGH (ref 4.0–10.5)
nRBC: 0.2 % (ref 0.0–0.2)

## 2020-04-14 LAB — RESP PANEL BY RT-PCR (FLU A&B, COVID) ARPGX2
Influenza A by PCR: NEGATIVE
Influenza B by PCR: NEGATIVE
SARS Coronavirus 2 by RT PCR: NEGATIVE

## 2020-04-14 LAB — LACTIC ACID, PLASMA
Lactic Acid, Venous: 2.4 mmol/L (ref 0.5–1.9)
Lactic Acid, Venous: 1.3 mmol/L (ref 0.5–1.9)

## 2020-04-14 MED ORDER — SODIUM CHLORIDE 0.9 % IV SOLN: INTRAVENOUS | Status: DC | PRN

## 2020-04-14 MED ORDER — SODIUM CHLORIDE 0.9 % IV SOLN
INTRAVENOUS | Status: DC | PRN
  Administered 2020-04-14: 21:00:00 500 mL via INTRAVENOUS

## 2020-04-14 MED ORDER — SODIUM BICARBONATE 8.4 % IV SOLN
INTRAVENOUS | Status: AC
  Filled 2020-04-14: qty 50

## 2020-04-14 MED ORDER — SODIUM BICARBONATE 8.4 % IV SOLN
INTRAVENOUS | Status: DC
  Filled 2020-04-14 (×8): qty 850

## 2020-04-14 MED ORDER — SODIUM CHLORIDE 0.9 % IV BOLUS (SEPSIS): 1000.0000 mL | Freq: Once | INTRAVENOUS | Status: DC

## 2020-04-14 MED ORDER — SODIUM CHLORIDE 0.9 % IV BOLUS
1000.0000 mL | Freq: Once | INTRAVENOUS | Status: AC
  Administered 2020-04-14: 20:00:00 1000 mL via INTRAVENOUS

## 2020-04-14 MED ORDER — SODIUM CHLORIDE 0.9 % IV SOLN: 1000.0000 mL | INTRAVENOUS | Status: DC

## 2020-04-14 MED ORDER — VANCOMYCIN HCL IN DEXTROSE 1-5 GM/200ML-% IV SOLN
1000.0000 mg | Freq: Once | INTRAVENOUS | Status: AC
  Administered 2020-04-14: 23:00:00 1000 mg via INTRAVENOUS
  Filled 2020-04-14: qty 200

## 2020-04-14 MED ORDER — CALCIUM GLUCONATE-NACL 1-0.675 GM/50ML-% IV SOLN
1.0000 g | Freq: Once | INTRAVENOUS | Status: AC
  Administered 2020-04-14: 21:00:00 1000 mg via INTRAVENOUS
  Filled 2020-04-14: qty 50

## 2020-04-14 MED ORDER — SODIUM ZIRCONIUM CYCLOSILICATE 10 G PO PACK
10.0000 g | PACK | Freq: Once | ORAL | Status: AC
  Administered 2020-04-14: 22:00:00 10 g via ORAL
  Filled 2020-04-14: qty 1

## 2020-04-14 MED ORDER — SODIUM CHLORIDE 0.9 % IV SOLN
INTRAVENOUS | Status: AC
  Filled 2020-04-14: qty 2

## 2020-04-14 MED ORDER — SODIUM BICARBONATE 8.4 % IV SOLN
50.0000 meq | Freq: Once | INTRAVENOUS | Status: AC
  Administered 2020-04-14: 21:00:00 50 meq via INTRAVENOUS
  Filled 2020-04-14: qty 50

## 2020-04-14 MED ORDER — SODIUM CHLORIDE 0.9 % IV SOLN
1.0000 g | INTRAVENOUS | Status: DC
  Administered 2020-04-14: 22:00:00 1 g via INTRAVENOUS
  Filled 2020-04-14 (×2): qty 1

## 2020-04-14 NOTE — ED Provider Notes (Signed)
[place] EMERGENCY DEPARTMENT Provider Note   CSN: [id number] Arrival date & time: 04/14/20  1908     History Chief Complaint  Patient presents with  . Weakness    Douglas Wood is a 86 y.o. male history of bladder cancer on chemotherapy and last chemo was about 3 weeks ago here presenting with weakness.  Patient has generalized weakness for the last 4 to 5 days.  He was running a fever 5 days ago and tested negative for Covid and flu.  Patient states that his fever resolved now he has low-grade temperature of 99 at home.  He states that he is just weak all over.  He has some subjective shortness of breath and cough as well.  He states that he is urinating less but denies any pain with urination.  The history is provided by the patient.       Past Medical History:  Diagnosis Date  . Arthritis    hands  . Bladder cancer [facility]) urologist-  dr ottelin/  oncologist---dr shadad   dx 04-14-2017 per high grade invasive urothelial carcinoma;   s/p  TURBT;    completed chemo/ radiation 04/ 2019  . Congenital absence of left kidney   . GERD (gastroesophageal reflux disease)   . History of basal cell carcinoma excision    hx multiple BCC of skin removals  . History of cancer chemotherapy    completed 07-2017  . History of chronic prostatitis UROLOGIST-  DR Consuella Lose  . History of external beam radiation therapy    completed 04-/ 2019 for bladder cancer  . History of hypertension    per pt no medication since 2018, blood pressure normalized  . History of kidney stones   . History of squamous cell carcinoma excision    multiple SCC of skin removals  . Hyperlipidemia   . IBS (irritable bowel syndrome)   . IBS (irritable bowel syndrome)   . Osteopenia   . Solitary kidney    right  . Wears glasses     Patient Active Problem List   Diagnosis Date Noted  . Goals of care, counseling/discussion 02/06/2020  . Solitary kidney   . Rectal pain 07/11/2017  . Cancer of  lateral wall of urinary bladder ([place]) 05/12/2017  . Lymphadenopathy of left cervical region 05/30/2013  . Left foot pain 10/15/2012  . ABNORMALITY OF GAIT 03/03/2010  . HIP PAIN, RIGHT 02/09/2010  . ITBS, LEFT KNEE 09/02/2009  . OTHER ACQUIRED DEFORMITY OF ANKLE AND FOOT OTHER 09/02/2009    Past Surgical History:  Procedure Laterality Date  . bcg treatment  last done may 2021   x6  . CYSTOSCOPY W/ RETROGRADES Bilateral 01/03/2020   Procedure: CYSTOSCOPY WITH RETROGRADE PYELOGRAM;  Surgeon: Alexis Frock, MD;  Location: [facility];  Service: Urology;  Laterality: Bilateral;  . EXCISION BENIGN LYMPH NODE LEFT CERCIAL REGION  09-11-2013    dr toth  [facility]  . HEMORROIDECTOMY  05/30/2005  . RIGHT URETEROSCOPY W/ STONE EXTRACTION AND STENT PLACEMENT  04-04-2008    dr Everlene Balls  [facility]  . TRANSURETHRAL RESECTION OF BLADDER TUMOR N/A 04/14/2017   Procedure: TRANSURETHRAL RESECTION OF BLADDER TUMOR  (TURBT);  Surgeon: Kathie Rhodes, MD;  Location: [facility];  Service: Urology;  Laterality: N/A;  . TRANSURETHRAL RESECTION OF BLADDER TUMOR N/A 05/22/2017   Procedure: TRANSURETHRAL RESECTION OF BLADDER TUMOR (TURBT);  Surgeon: Kathie Rhodes, MD;  Location: [facility];  Service: Urology;  Laterality: N/A;  .  TRANSURETHRAL RESECTION OF BLADDER TUMOR N/A 12/07/2018   Procedure: CYSTOSCOPY, TRANSURETHRAL RESECTION OF BLADDER TUMOR (TURBT) WITH INSTILLATION OF POST OPERATIVE CHEMOTHERAPY;  Surgeon: Kathie Rhodes, MD;  Location: [place];  Service: Urology;  Laterality: N/A;  ONLY NEEDS 60 MIN  . TRANSURETHRAL RESECTION OF BLADDER TUMOR N/A 07/15/2019   Procedure: TRANSURETHRAL RESECTION OF BLADDER TUMOR (TURBT)/ CYSTOSCOPY;  Surgeon: Kathie Rhodes, MD;  Location: [place];  Service: Urology;  Laterality: N/A;  . TRANSURETHRAL RESECTION OF BLADDER TUMOR N/A 01/03/2020   Procedure: TRANSURETHRAL RESECTION OF BLADDER TUMOR (TURBT);   Surgeon: Alexis Frock, MD;  Location: [facility];  Service: Urology;  Laterality: N/A;  1 HR       Family History  Problem Relation Age of Onset  . Prostate cancer Father   . Bladder Cancer Brother        bladder/dx 30 years ago    Social History   Tobacco Use  . Smoking status: Former Smoker    Packs/day: 1.00    Years: 10.00    Pack years: 10.00    Types: Cigarettes    Quit date: 04/11/1964    Years since quitting: 56.0  . Smokeless tobacco: Never Used  Vaping Use  . Vaping Use: Never used  Substance Use Topics  . Alcohol use: Yes    Comment: 1-2 shots vodka per day  . Drug use: No    Home Medications Prior to Admission medications   Medication Sig Start Date End Date Taking? Authorizing Provider  acetaminophen (TYLENOL) 325 MG tablet Take 650 mg by mouth every 6 (six) hours as needed for mild pain, moderate pain or headache.    [provider]  diazepam (VALIUM) 5 MG tablet Take 5 mg by mouth at bedtime as needed for anxiety.     [provider]  ibandronate (BONIVA) 150 MG tablet Take 150 mg by mouth every 30 (thirty) days. Take in the morning with a full glass of water, on an empty stomach, and do not take anything else by mouth or lie down for the next 30 min.    [provider]  Meth-Hyo-M Bl-Na Phos-Ph Sal (URIBEL) 118 MG CAPS Take 118 capsules by mouth.    [provider]  Meth-Hyo-M Bl-Na Phos-Ph Sal (URIBEL) 118 MG CAPS Take 1 capsule (118 mg total) by mouth every 8 (eight) hours as needed (for dysuria). 01/03/20   Alexis Frock, MD  omeprazole (PRILOSEC) 20 MG capsule Take 20 mg by mouth every evening.     [provider]  prochlorperazine (COMPAZINE) 10 MG tablet Take 1 tablet (10 mg total) by mouth every 6 (six) hours as needed for nausea or vomiting. 02/06/20   Wyatt Portela, MD  silodosin (RAPAFLO) 8 MG CAPS capsule Take 8 mg by mouth every evening.    [provider]  simvastatin  (ZOCOR) 10 MG tablet Take 10 mg by mouth every evening. 07/29/14   [provider]  traMADol (ULTRAM) 50 MG tablet Take 1 tablet (50 mg total) by mouth every 6 (six) hours as needed for moderate pain. Post-operatively 01/03/20 01/02/21  Alexis Frock, MD    Allergies    Patient has no known allergies.  Review of Systems   Review of Systems  Constitutional: Positive for chills, fatigue and fever.  Respiratory: Positive for cough.   Neurological: Positive for weakness.  All other systems reviewed and are negative.   Physical Exam Updated Vital Signs BP (!) 99/58  Pulse (!) 107   Temp 99.2 F (37.3 C) (Rectal)   Resp 15   Ht 5' 9.5" (1.765 m)   Wt 64.4 kg   SpO2 95%   BMI 20.67 kg/m   Physical Exam Vitals and nursing note reviewed.  Constitutional:      Comments: Chronically ill, dehydrated  HENT:     Head: Normocephalic.     Nose: Nose normal.     Mouth/Throat:     Mouth: Mucous membranes are dry.  Eyes:     Extraocular Movements: Extraocular movements intact.     Pupils: Pupils are equal, round, and reactive to light.  Cardiovascular:     Rate and Rhythm: Normal rate and regular rhythm.     Pulses: Normal pulses.     Heart sounds: Normal heart sounds.  Pulmonary:     Comments: Diminished bilaterally. Abdominal:     General: Abdomen is flat.     Palpations: Abdomen is soft.  Musculoskeletal:        General: Normal range of motion.     Cervical back: Normal range of motion and neck supple.  Skin:    General: Skin is warm.     Capillary Refill: Capillary refill takes less than 2 seconds.  Neurological:     General: No focal deficit present.  Psychiatric:        Mood and Affect: Mood normal.     ED Results / Procedures / Treatments   Labs (all labs ordered are listed, but only abnormal results are displayed) Labs Reviewed  CBC WITH DIFFERENTIAL/PLATELET - Abnormal; Notable for the following components:      Result Value   WBC 20.6 (*)    RBC  4.12 (*)    Hemoglobin 12.2 (*)    HCT 35.4 (*)    RDW 15.7 (*)    Platelets 128 (*)    Neutro Abs 18.4 (*)    Lymphs Abs 0.3 (*)    Abs Immature Granulocytes 0.87 (*)    All other components within normal limits  COMPREHENSIVE METABOLIC PANEL - Abnormal; Notable for the following components:   Sodium 129 (*)    Potassium 5.7 (*)    CO2 11 (*)    Glucose, Bld 110 (*)    BUN 119 (*)    Creatinine, Ser 6.17 (*)    Calcium 8.2 (*)    Albumin 2.8 (*)    AST 45 (*)    Alkaline Phosphatase 182 (*)    GFR, Estimated 8 (*)    Anion gap 20 (*)    All other components within normal limits  LACTIC ACID, PLASMA - Abnormal; Notable for the following components:   Lactic Acid, Venous 2.4 (*)    All other components within normal limits  RESP PANEL BY RT-PCR (FLU A&B, COVID) ARPGX2  CULTURE, BLOOD (ROUTINE X 2)  CULTURE, BLOOD (ROUTINE X 2)  URINE CULTURE  URINALYSIS, ROUTINE W REFLEX MICROSCOPIC  LACTIC ACID, PLASMA    EKG None  Radiology DG Chest Port 1 View  Result Date: 04/14/2020 CLINICAL DATA:  Generalized weakness with shortness of breath and cough x4 days. EXAM: PORTABLE CHEST 1 VIEW COMPARISON:  September 28, 2015 FINDINGS: Very mild atelectasis is seen within the left lung base. There is no evidence of acute infiltrate, pleural effusion or pneumothorax. The heart size and mediastinal contours are within normal limits. The visualized skeletal structures are unremarkable. IMPRESSION: Very mild left basilar atelectasis. Electronically Signed   By: Joyce Gross.D.  On: 04/14/2020 19:45    Procedures Procedures (including critical care time)  CRITICAL CARE Performed by: Wandra Arthurs   Total critical care time: 40 minutes  Critical care time was exclusive of separately billable procedures and treating other patients.  Critical care was necessary to treat or prevent imminent or life-threatening deterioration.  Critical care was time spent personally by me on the  following activities: development of treatment plan with patient and/or surrogate as well as nursing, discussions with consultants, evaluation of patient's response to treatment, examination of patient, obtaining history from patient or surrogate, ordering and performing treatments and interventions, ordering and review of laboratory studies, ordering and review of radiographic studies, pulse oximetry and re-evaluation of patient's condition.   Medications Ordered in ED Medications  vancomycin (VANCOCIN) IVPB 1000 mg/200 mL premix (has no administration in time range)  sodium chloride 0.9 % bolus 1,000 mL (1,000 mLs Intravenous New Bag/Given 04/14/20 1951)    ED Course  I have reviewed the triage vital signs and the nursing notes.  Pertinent labs & imaging results that were available during my care of the patient were reviewed by me and considered in my medical decision making (see chart for details).    MDM Rules/Calculators/A&P                         Douglas Wood is a 86 y.o. male who presented with generalized weakness.  He also had a fever several days ago.  Differential is large and consider sepsis from UTI versus pneumonia and electrolyte abnormalities.  Plan to get CBC, CMP, lactate, cultures, chest x-ray, urinalysis.   9:28 PM WBC is 20.  Patient also has acute renal failure with creatinine of 6 and potassium of 5.7.  There is some peaked T waves as well.  I ordered bicarb and calcium and Lokelma.  CT showed R sided hydro that is new and possible stricture around the bladder.  I am concerned for possible stricture and recurrent bladder cancer.  I discussed with Dr. Justin Mend from nephrology.  He recommend a bicarb drip as well.  He states that patient may need a nephrostomy tube.  I also discussed with urology, Dr. Louis Meckel.  He states that patient may be amenable for a stent.  They both agree that patient will need to be admitted to the hospital service.   10 pm Hospitalist to admit.    Final Clinical Impression(s) / ED Diagnoses Final diagnoses:  None    Rx / DC Orders ED Discharge Orders    None       Drenda Freeze, MD 04/14/20 2259

## 2020-04-14 NOTE — Progress Notes (Signed)
Pharmacy Antibiotic Note  Douglas Wood is a 86 y.o. male admitted on 04/14/2020 with concern for bacteremia.  Pharmacy has been consulted for Cefepime dosing. Of note, patient has a dose of vancomycin ordered in the ED.   WBC elevated at 20.6. SCr elevated at 6.17 (BL ~ 1.9).   Plan: -Cefepime 1 gm IV Q 24 hours  -F/u if maintenance vancomycin is needed -Monitor clinical progress and CBC  Height: 5' 9.5" (176.5 cm) Weight: 64.4 kg (142 lb) IBW/kg (Calculated) : 71.85  Temp (24hrs), Avg:98.4 F (36.9 C), Min:97.5 F (36.4 C), Max:99.2 F (37.3 C)  Recent Labs  Lab 04/14/20 1935  WBC 20.6*  CREATININE 6.17*  LATICACIDVEN 2.4*    Estimated Creatinine Clearance: 7.8 mL/min (A) (by C-G formula based on SCr of 6.17 mg/dL (H)).    No Known Allergies  Antimicrobials this admission: Cefepime 12/21 >>  Vanc 12/21 >>   Dose adjustments this admission:  Microbiology results: 12/21 BCx:  12/21 UCx:     Thank you for allowing pharmacy to be a part of this patient's care.  Albertina Parr, PharmD., BCPS, BCCCP Clinical Pharmacist Please refer to [facility] for unit-specific pharmacist

## 2020-04-14 NOTE — Consult Note (Signed)
Chart reviewed.  CT scan reviewed.  He likely has developed urinary retention with ureteral reflux and post-obstructive renal failure in combination with chronic renal failure.  Place foley catheter, hydrate, and repeat U/S in the AM.  I will see patient once he has made it to [facility] or [facility].    Please contact me once he has arrived.

## 2020-04-14 NOTE — ED Notes (Signed)
Patient denies pain and is resting comfortably.  

## 2020-04-14 NOTE — ED Triage Notes (Addendum)
C/o generalized weakness, SOB  and cough  x 4 days , neg for covid and flu

## 2020-04-14 NOTE — ED Notes (Signed)
Date and time results received: 04/14/20 2011  Test: Lactic acid Critical Value: 2.4  Name of Provider Notified: Dr. Darl Householder  Orders Received? Or Actions Taken?: no new orders

## 2020-04-15 ENCOUNTER — Encounter (HOSPITAL_COMMUNITY): Payer: Self-pay | Admitting: Internal Medicine

## 2020-04-15 ENCOUNTER — Inpatient Hospital Stay (HOSPITAL_COMMUNITY): Payer: Medicare Other

## 2020-04-15 DIAGNOSIS — E871 Hypo-osmolality and hyponatremia: Secondary | ICD-10-CM

## 2020-04-15 DIAGNOSIS — C672 Malignant neoplasm of lateral wall of bladder: Secondary | ICD-10-CM | POA: Diagnosis present

## 2020-04-15 DIAGNOSIS — Z85828 Personal history of other malignant neoplasm of skin: Secondary | ICD-10-CM | POA: Diagnosis not present

## 2020-04-15 DIAGNOSIS — K219 Gastro-esophageal reflux disease without esophagitis: Secondary | ICD-10-CM | POA: Diagnosis present

## 2020-04-15 DIAGNOSIS — Z9221 Personal history of antineoplastic chemotherapy: Secondary | ICD-10-CM | POA: Diagnosis not present

## 2020-04-15 DIAGNOSIS — E785 Hyperlipidemia, unspecified: Secondary | ICD-10-CM | POA: Diagnosis present

## 2020-04-15 DIAGNOSIS — Z20822 Contact with and (suspected) exposure to covid-19: Secondary | ICD-10-CM | POA: Diagnosis present

## 2020-04-15 DIAGNOSIS — Q600 Renal agenesis, unilateral: Secondary | ICD-10-CM | POA: Diagnosis not present

## 2020-04-15 DIAGNOSIS — R32 Unspecified urinary incontinence: Secondary | ICD-10-CM | POA: Diagnosis present

## 2020-04-15 DIAGNOSIS — N179 Acute kidney failure, unspecified: Secondary | ICD-10-CM

## 2020-04-15 DIAGNOSIS — N1330 Unspecified hydronephrosis: Secondary | ICD-10-CM | POA: Diagnosis not present

## 2020-04-15 DIAGNOSIS — A4151 Sepsis due to Escherichia coli [E. coli]: Secondary | ICD-10-CM | POA: Diagnosis present

## 2020-04-15 DIAGNOSIS — N136 Pyonephrosis: Secondary | ICD-10-CM | POA: Diagnosis present

## 2020-04-15 DIAGNOSIS — N170 Acute kidney failure with tubular necrosis: Secondary | ICD-10-CM | POA: Diagnosis present

## 2020-04-15 DIAGNOSIS — A419 Sepsis, unspecified organism: Secondary | ICD-10-CM

## 2020-04-15 DIAGNOSIS — Z66 Do not resuscitate: Secondary | ICD-10-CM | POA: Diagnosis present

## 2020-04-15 DIAGNOSIS — Y842 Radiological procedure and radiotherapy as the cause of abnormal reaction of the patient, or of later complication, without mention of misadventure at the time of the procedure: Secondary | ICD-10-CM | POA: Diagnosis present

## 2020-04-15 DIAGNOSIS — E875 Hyperkalemia: Secondary | ICD-10-CM | POA: Diagnosis present

## 2020-04-15 DIAGNOSIS — N3041 Irradiation cystitis with hematuria: Secondary | ICD-10-CM | POA: Diagnosis present

## 2020-04-15 DIAGNOSIS — Z87891 Personal history of nicotine dependence: Secondary | ICD-10-CM | POA: Diagnosis not present

## 2020-04-15 DIAGNOSIS — R6520 Severe sepsis without septic shock: Secondary | ICD-10-CM | POA: Diagnosis present

## 2020-04-15 DIAGNOSIS — M19042 Primary osteoarthritis, left hand: Secondary | ICD-10-CM | POA: Diagnosis present

## 2020-04-15 DIAGNOSIS — Z1612 Extended spectrum beta lactamase (ESBL) resistance: Secondary | ICD-10-CM | POA: Diagnosis present

## 2020-04-15 DIAGNOSIS — Z7189 Other specified counseling: Secondary | ICD-10-CM | POA: Diagnosis not present

## 2020-04-15 DIAGNOSIS — E872 Acidosis: Secondary | ICD-10-CM | POA: Diagnosis present

## 2020-04-15 DIAGNOSIS — M19041 Primary osteoarthritis, right hand: Secondary | ICD-10-CM | POA: Diagnosis present

## 2020-04-15 DIAGNOSIS — N12 Tubulo-interstitial nephritis, not specified as acute or chronic: Secondary | ICD-10-CM

## 2020-04-15 DIAGNOSIS — Z515 Encounter for palliative care: Secondary | ICD-10-CM | POA: Diagnosis not present

## 2020-04-15 DIAGNOSIS — Z87442 Personal history of urinary calculi: Secondary | ICD-10-CM | POA: Diagnosis not present

## 2020-04-15 LAB — BLOOD CULTURE ID PANEL (REFLEXED) - BCID2

## 2020-04-15 LAB — CORTISOL-AM, BLOOD: Cortisol - AM: 48.9 ug/dL — ABNORMAL HIGH (ref 6.7–22.6)

## 2020-04-15 LAB — BASIC METABOLIC PANEL
Anion gap: 18 — ABNORMAL HIGH (ref 5–15)
BUN: 125 mg/dL — ABNORMAL HIGH (ref 8–23)
CO2: 13 mmol/L — ABNORMAL LOW (ref 22–32)
Calcium: 7.5 mg/dL — ABNORMAL LOW (ref 8.9–10.3)
Chloride: 98 mmol/L (ref 98–111)
Creatinine, Ser: 6.37 mg/dL — ABNORMAL HIGH (ref 0.61–1.24)
GFR, Estimated: 8 mL/min — ABNORMAL LOW (ref >60)
Glucose, Bld: 120 mg/dL — ABNORMAL HIGH (ref 70–99)
Potassium: 5.4 mmol/L — ABNORMAL HIGH (ref 3.5–5.1)
Sodium: 129 mmol/L — ABNORMAL LOW (ref 135–145)

## 2020-04-15 LAB — PROCALCITONIN: Procalcitonin: 32.02 ng/mL

## 2020-04-15 LAB — PROTIME-INR
INR: 1.3 — ABNORMAL HIGH (ref 0.8–1.2)
Prothrombin Time: 16 seconds — ABNORMAL HIGH (ref 11.4–15.2)

## 2020-04-15 LAB — CBC
HCT: 26.9 % — ABNORMAL LOW (ref 39.0–52.0)
Hemoglobin: 9.4 g/dL — ABNORMAL LOW (ref 13.0–17.0)
MCH: 29.3 pg (ref 26.0–34.0)
MCHC: 34.9 g/dL (ref 30.0–36.0)
MCV: 83.8 fL (ref 80.0–100.0)
Platelets: 99 10*3/uL — ABNORMAL LOW (ref 150–400)
RBC: 3.21 MIL/uL — ABNORMAL LOW (ref 4.22–5.81)
RDW: 15.4 % (ref 11.5–15.5)
WBC: 21.6 10*3/uL — ABNORMAL HIGH (ref 4.0–10.5)
nRBC: 0.2 % (ref 0.0–0.2)

## 2020-04-15 MED ORDER — SODIUM CHLORIDE 0.9 % IV SOLN: INTRAVENOUS | Status: DC

## 2020-04-15 MED ORDER — SODIUM ZIRCONIUM CYCLOSILICATE 10 G PO PACK: 10.0000 g | PACK | Freq: Once | ORAL | Status: DC

## 2020-04-15 MED ORDER — SENNOSIDES-DOCUSATE SODIUM 8.6-50 MG PO TABS: 1.0000 | ORAL_TABLET | Freq: Every evening | ORAL | Status: DC | PRN

## 2020-04-15 MED ORDER — SODIUM CHLORIDE 0.9 % IV SOLN
2.0000 g | INTRAVENOUS | Status: DC
  Administered 2020-04-15: 18:00:00 2 g via INTRAVENOUS
  Filled 2020-04-15 (×2): qty 20

## 2020-04-15 MED ORDER — CHLORHEXIDINE GLUCONATE CLOTH 2 % EX PADS
6.0000 | MEDICATED_PAD | Freq: Every day | CUTANEOUS | Status: DC
  Administered 2020-04-16 – 2020-04-17 (×2): 6 via TOPICAL

## 2020-04-15 MED ORDER — TRAMADOL HCL 50 MG PO TABS: 50.0000 mg | ORAL_TABLET | Freq: Four times a day (QID) | ORAL | Status: DC | PRN

## 2020-04-15 MED ORDER — ACETAMINOPHEN 325 MG PO TABS: 650.0000 mg | ORAL_TABLET | Freq: Four times a day (QID) | ORAL | Status: DC | PRN

## 2020-04-15 MED ORDER — VANCOMYCIN VARIABLE DOSE PER UNSTABLE RENAL FUNCTION (PHARMACIST DOSING): Status: DC

## 2020-04-15 MED ORDER — HEPARIN SODIUM (PORCINE) 5000 UNIT/ML IJ SOLN: 5000.0000 [IU] | Freq: Three times a day (TID) | INTRAMUSCULAR | Status: DC

## 2020-04-15 MED ORDER — SODIUM ZIRCONIUM CYCLOSILICATE 10 G PO PACK
10.0000 g | PACK | Freq: Every day | ORAL | Status: DC
  Administered 2020-04-15 – 2020-04-17 (×3): 10 g via ORAL
  Filled 2020-04-15 (×3): qty 1

## 2020-04-15 NOTE — Progress Notes (Signed)
PROGRESS NOTE    Douglas Wood  NTI:[id number] DOB: 1931/07/31 DOA: 04/14/2020 PCP: Lupita Raider, MD   Brief Narrative:  Patient is 86 year old male with past medical history of bladder cancer-on chemotherapy.  His last chemo session was about 3 weeks ago.  Presented with complaint of generalized weakness since past few days.  Upon arrival to ED: Patient was found to have acute renal failure with creatinine of 6.17 elevated from baseline of 1.8 a month ago.  He was also found to have severe right-sided hydronephrosis with possible obstruction or stricture of the ureter at the bladder.  Nephrology and urology consulted.  Patient started on broad-spectrum antibiotics and admitted for further evaluation and management.  Assessment & Plan:   Severe sepsis in the setting of E. coli bacteremia and severe right-sided hydronephrosis: -Patient presented with tachycardia, tachypnea, low blood pressure, elevated lactic acid of 2.4, leukocytosis in 20,000.  PCT: 32 -CT renal study shows hydronephrosis to the level of the patient's urinary bladder.  No definite obstructing stone.  Finding may be secondary to underlying mass or stricture. -Patient started on cefepime and vancomycin and IV fluids in the ED -Blood culture 4 out of 4 shows E. coli no resistance.  Changed IV antibiotics to IV Rocephin. -Catheter-blood-tinged urine noted -Appreciate urology recommendations-repeat renal ultrasound from this morning shows mild improvement in hydronephrosis. -Monitor vitals and H&H closely  Severe AKI: -Likely secondary to urinary retention with reflux and postobstructive renal failure -Renal function from this morning: Same -Continue IV fluids.  Hold nephrotoxic medication. -Repeat BMP tomorrow a.m.  High anion gap metabolic acidosis: -In the setting of AKI. -Continue sodium bicarb infusion as per nephrology recommendations  Hyperkalemia: -Potassium 5.4 this morning.  Will give a dose of  Lokelma.  Repeat BMP tomorrow a.m.  Hyponatremia: Sodium 129. -Continue IV fluids -Repeat BMP tomorrow a.m.  Cancer of lateral wall of urinary bladder: -Currently on chemotherapy.  Last chemo was about 3 weeks ago.  Followed by oncology outpatient  Thrombocytopenia: Platelet: 99 -Repeat CBC tomorrow a.m.  DVT prophylaxis: DC heparin-in the setting of hematuria and low platelet count Code Status: Full code Family Communication: None present at bedside.  Plan of care discussed with patient in length and he verbalized understanding and agreed with it.  I talked to patient's daughter on the phone and updated her and she verbalized understanding.  Disposition Plan: To be determined  Consultants:   Urology  Nephrology  Procedures:  CT renal study Renal ultrasound Antimicrobials:  Cefepime Vancomycin Rocephin\  Status is: Inpatient   Dispo: The patient is from: Home              Anticipated d/c is to: Home              Anticipated d/c date is: 2 days              Patient currently is not medically stable to d/c.    Subjective: Patient seen and examined.  Lying comfortably on the bed.  Denies any complaints including fever, chills, chest pain, nausea, vomiting, abdominal pain.  Objective: Vitals:   04/15/20 0013 04/15/20 0400 04/15/20 0500 04/15/20 0939  BP: 132/75 129/72 135/74 135/71  Pulse: (!) 102 (!) 101 (!) 106   Resp: 18 18 18  (!) 21  Temp: 97.9 F (36.6 C) 98 F (36.7 C) (!) 97.5 F (36.4 C) (!) 97.5 F (36.4 C)  TempSrc: Oral Oral Oral Oral  SpO2: 96% 98% 95% 95%  Weight:  Height:        Intake/Output Summary (Last 24 hours) at 04/15/2020 1127 Last data filed at 04/15/2020 0948 Gross per 24 hour  Intake 1100 ml  Output 525 ml  Net 575 ml   Filed Weights   04/14/20 1915  Weight: 64.4 kg    Examination:  General exam: Appears calm and comfortable  Respiratory system: Clear to auscultation. Respiratory effort normal. Cardiovascular  system: S1 & S2 heard, RRR. No JVD, murmurs, rubs, gallops or clicks. No pedal edema. Gastrointestinal system: Abdomen is nondistended, soft and nontender. No organomegaly or masses felt. Normal bowel sounds heard. Central nervous system: Alert and oriented. No focal neurological deficits. Extremities: Symmetric 5 x 5 power. Skin: No rashes, lesions or ulcers Psychiatry: Judgement and insight appear normal. Mood & affect appropriate.    Data Reviewed: I have personally reviewed following labs and imaging studies  CBC: Recent Labs  Lab 04/14/20 1935 04/15/20 0407  WBC 20.6* 21.6*  NEUTROABS 18.4*  --   HGB 12.2* 9.4*  HCT 35.4* 26.9*  MCV 85.9 83.8  PLT 128* 99*   Basic Metabolic Panel: Recent Labs  Lab 04/14/20 1935 04/15/20 0407  NA 129* 129*  K 5.7* 5.4*  CL 98 98  CO2 11* 13*  GLUCOSE 110* 120*  BUN 119* 125*  CREATININE 6.17* 6.37*  CALCIUM 8.2* 7.5*   GFR: Estimated Creatinine Clearance: 7.6 mL/min (A) (by C-G formula based on SCr of 6.37 mg/dL (H)). Liver Function Tests: Recent Labs  Lab 04/14/20 1935  AST 45*  ALT 43  ALKPHOS 182*  BILITOT 1.0  PROT 7.6  ALBUMIN 2.8*   No results for input(s): LIPASE, AMYLASE in the last 168 hours. No results for input(s): AMMONIA in the last 168 hours. Coagulation Profile: Recent Labs  Lab 04/15/20 0407  INR 1.3*   Cardiac Enzymes: No results for input(s): CKTOTAL, CKMB, CKMBINDEX, TROPONINI in the last 168 hours. BNP (last 3 results) No results for input(s): PROBNP in the last 8760 hours. HbA1C: No results for input(s): HGBA1C in the last 72 hours. CBG: No results for input(s): GLUCAP in the last 168 hours. Lipid Profile: No results for input(s): CHOL, HDL, LDLCALC, TRIG, CHOLHDL, LDLDIRECT in the last 72 hours. Thyroid Function Tests: No results for input(s): TSH, T4TOTAL, FREET4, T3FREE, THYROIDAB in the last 72 hours. Anemia Panel: No results for input(s): VITAMINB12, FOLATE, FERRITIN, TIBC, IRON,  RETICCTPCT in the last 72 hours. Sepsis Labs: Recent Labs  Lab 04/14/20 1935 04/14/20 2245 04/15/20 0407  PROCALCITON  --   --  32.02  LATICACIDVEN 2.4* 1.3  --     Recent Results (from the past 240 hour(s))  Blood culture (routine x 2)     Status: None (Preliminary result)   Collection Time: 04/14/20  7:34 PM   Specimen: BLOOD  Result Value Ref Range Status   Specimen Description   Final    BLOOD BLOOD LEFT FOREARM Performed at [facility], [address] [place], [place] [address]    Special Requests   Final    BOTTLES DRAWN AEROBIC AND ANAEROBIC Blood Culture adequate volume Performed at [facility], [address] [place], [place] [address]    Culture  Setup Time   Final    GRAM NEGATIVE RODS IN BOTH AEROBIC AND ANAEROBIC BOTTLES Organism ID to follow CRITICAL RESULT CALLED TO, READ BACK BY AND VERIFIED WITH: EMILY SINCLAIR PHARMD @1024  04/15/20 EB Performed at [facility], [place]  [address], [place], [place] [address]    Culture GRAM NEGATIVE RODS  Final   Report Status PENDING  Incomplete  Blood Culture ID Panel (Reflexed)     Status: Abnormal   Collection Time: 04/14/20  7:34 PM  Result Value Ref Range Status   Enterococcus faecalis NOT DETECTED NOT DETECTED Final   Enterococcus Faecium NOT DETECTED NOT DETECTED Final   Listeria monocytogenes NOT DETECTED NOT DETECTED Final   Staphylococcus species NOT DETECTED NOT DETECTED Final   Staphylococcus aureus (BCID) NOT DETECTED NOT DETECTED Final   Staphylococcus epidermidis NOT DETECTED NOT DETECTED Final   Staphylococcus lugdunensis NOT DETECTED NOT DETECTED Final   Streptococcus species NOT DETECTED NOT DETECTED Final   Streptococcus agalactiae NOT DETECTED NOT DETECTED Final   Streptococcus pneumoniae NOT DETECTED NOT DETECTED Final   Streptococcus pyogenes NOT DETECTED NOT DETECTED Final   A.calcoaceticus-baumannii NOT DETECTED NOT DETECTED Final   Bacteroides fragilis NOT DETECTED  NOT DETECTED Final   Enterobacterales DETECTED (A) NOT DETECTED Final    Comment: Enterobacterales represent a large order of gram negative bacteria, not a single organism. CRITICAL RESULT CALLED TO, READ BACK BY AND VERIFIED WITH: EMILY SINCLAIR PHARMD @1024  04/15/20 EB    Enterobacter cloacae complex NOT DETECTED NOT DETECTED Final   Escherichia coli DETECTED (A) NOT DETECTED Final    Comment: CRITICAL RESULT CALLED TO, READ BACK BY AND VERIFIED WITH: EMILY SINCLAIR PHARMD @1024  04/15/20 EB    Klebsiella aerogenes NOT DETECTED NOT DETECTED Final   Klebsiella oxytoca NOT DETECTED NOT DETECTED Final   Klebsiella pneumoniae NOT DETECTED NOT DETECTED Final   Proteus species NOT DETECTED NOT DETECTED Final   Salmonella species NOT DETECTED NOT DETECTED Final   Serratia marcescens NOT DETECTED NOT DETECTED Final   Haemophilus influenzae NOT DETECTED NOT DETECTED Final   Neisseria meningitidis NOT DETECTED NOT DETECTED Final   Pseudomonas aeruginosa NOT DETECTED NOT DETECTED Final   Stenotrophomonas maltophilia NOT DETECTED NOT DETECTED Final   Candida albicans NOT DETECTED NOT DETECTED Final   Candida auris NOT DETECTED NOT DETECTED Final   Candida glabrata NOT DETECTED NOT DETECTED Final   Candida krusei NOT DETECTED NOT DETECTED Final   Candida parapsilosis NOT DETECTED NOT DETECTED Final   Candida tropicalis NOT DETECTED NOT DETECTED Final   Cryptococcus neoformans/gattii NOT DETECTED NOT DETECTED Final   CTX-M ESBL NOT DETECTED NOT DETECTED Final   Carbapenem resistance IMP NOT DETECTED NOT DETECTED Final   Carbapenem resistance KPC NOT DETECTED NOT DETECTED Final   Carbapenem resistance NDM NOT DETECTED NOT DETECTED Final   Carbapenem resist OXA 48 LIKE NOT DETECTED NOT DETECTED Final   Carbapenem resistance VIM NOT DETECTED NOT DETECTED Final    Comment: Performed at [facility], [address], [place], [place] [address]  Resp Panel by RT-PCR (Flu A&B, Covid)  Nasopharyngeal Swab     Status: None   Collection Time: 04/14/20  7:35 PM   Specimen: Nasopharyngeal Swab; Nasopharyngeal(NP) swabs in vial transport medium  Result Value Ref Range Status   SARS Coronavirus 2 by RT PCR NEGATIVE NEGATIVE Final    Comment: (NOTE) SARS-CoV-2 target nucleic acids are NOT DETECTED.  The SARS-CoV-2 RNA is generally detectable in upper respiratory specimens during the acute phase of infection. The lowest concentration of SARS-CoV-2 viral copies this assay can detect is 138 copies/mL. A negative result does not preclude SARS-Cov-2 infection and should not be used as the sole basis for treatment or other patient management decisions. A negative result  may occur with  improper specimen collection/handling, submission of specimen other than nasopharyngeal swab, presence of viral mutation(s) within the areas targeted by this assay, and inadequate number of viral copies(<138 copies/mL). A negative result must be combined with clinical observations, patient history, and epidemiological information. The expected result is Negative.  Fact Sheet for Patients:  EntrepreneurPulse.com.au  Fact Sheet for Healthcare Providers:  IncredibleEmployment.be  This test is no t yet approved or cleared by the [place] FDA and  has been authorized for detection and/or diagnosis of SARS-CoV-2 by FDA under an Emergency Use Authorization (EUA). This EUA will remain  in effect (meaning this test can be used) for the duration of the COVID-19 declaration under Section 564(b)(1) of the Act, 21 U.S.C.section 360bbb-3(b)(1), unless the authorization is terminated  or revoked sooner.       Influenza A by PCR NEGATIVE NEGATIVE Final   Influenza B by PCR NEGATIVE NEGATIVE Final    Comment: (NOTE) The Xpert Xpress SARS-CoV-2/FLU/RSV plus assay is intended as an aid in the diagnosis of influenza from Nasopharyngeal swab specimens and should not be  used as a sole basis for treatment. Nasal washings and aspirates are unacceptable for Xpert Xpress SARS-CoV-2/FLU/RSV testing.  Fact Sheet for Patients: EntrepreneurPulse.com.au  Fact Sheet for Healthcare Providers: IncredibleEmployment.be  This test is not yet approved or cleared by the [place] FDA and has been authorized for detection and/or diagnosis of SARS-CoV-2 by FDA under an Emergency Use Authorization (EUA). This EUA will remain in effect (meaning this test can be used) for the duration of the COVID-19 declaration under Section 564(b)(1) of the Act, 21 U.S.C. section 360bbb-3(b)(1), unless the authorization is terminated or revoked.  Performed at [facility], [address] [place], [place] [address]   Blood culture (routine x 2)     Status: None (Preliminary result)   Collection Time: 04/14/20  8:00 PM   Specimen: BLOOD  Result Value Ref Range Status   Specimen Description   Final    BLOOD RIGHT ANTECUBITAL Performed at [facility], [address] [place], [place] [address]    Special Requests   Final    BOTTLES DRAWN AEROBIC AND ANAEROBIC Blood Culture adequate volume Performed at [facility], [address] [place], [place] [address]    Culture  Setup Time   Final    GRAM NEGATIVE RODS IN BOTH AEROBIC AND ANAEROBIC BOTTLES CRITICAL VALUE NOTED.  VALUE IS CONSISTENT WITH PREVIOUSLY REPORTED AND CALLED VALUE. Performed at [facility], [place] [address], [place], [place] [address]    Culture GRAM NEGATIVE RODS  Final   Report Status PENDING  Incomplete      Radiology Studies: US RENAL  Result Date: 04/15/2020 CLINICAL DATA:  Acute renal failure EXAM: RENAL / URINARY TRACT ULTRASOUND COMPLETE COMPARISON:  Renal stone CT from yesterday FINDINGS: Right Kidney: Renal measurements: 15 x 7 x 7 cm = volume: 380 mL. Improved hydronephrosis. No evidence of mass or collection Left  Kidney: Absent Bladder: Thick walled bladder which is collapsed around a Foley catheter. IMPRESSION: 1. Mild and improved hydronephrosis of the solitary right kidney after bladder drainage. 2. Thick walled bladder. Electronically Signed   By: Monte Fantasia M.D.   On: 04/15/2020 04:53   DG Chest Port 1 View  Result Date: 04/14/2020 CLINICAL DATA:  Generalized weakness with shortness of breath and cough x4 days. EXAM: PORTABLE CHEST 1 VIEW COMPARISON:  September 28, 2015 FINDINGS:  Very mild atelectasis is seen within the left lung base. There is no evidence of acute infiltrate, pleural effusion or pneumothorax. The heart size and mediastinal contours are within normal limits. The visualized skeletal structures are unremarkable. IMPRESSION: Very mild left basilar atelectasis. Electronically Signed   By: Virgina Norfolk M.D.   On: 04/14/2020 19:45   CT Renal Stone Study  Result Date: 04/14/2020 CLINICAL DATA:  Flank pain. Generalized weakness and shortness of breath. EXAM: CT ABDOMEN AND PELVIS WITHOUT CONTRAST TECHNIQUE: Multidetector CT imaging of the abdomen and pelvis was performed following the standard protocol without IV contrast. COMPARISON:  November 08, 2018 FINDINGS: Lower chest: There is a trace right-sided pleural effusion with adjacent atelectasis.There appears to be an ascending thoracic aortic aneurysm measuring approximately 4.3 cm. This was previously described. The intracardiac blood pool is hypodense relative to the adjacent myocardium consistent with anemia. Hepatobiliary: The liver is normal. Normal gallbladder.There is no biliary ductal dilation. Pancreas: Normal contours without ductal dilatation. No peripancreatic fluid collection. Spleen: Unremarkable. Adrenals/Urinary Tract: --Adrenal glands: Unremarkable. --Right kidney/ureter: There is severe right-sided hydronephrosis to the level of the patient's urinary bladder. There is no definite obstructing stone visualized in the distal right  ureter. --Left kidney/ureter: The left kidney is absent. --Urinary bladder: There is bladder wall thickening with bladder wall calcifications as before. Stomach/Bowel: --Stomach/Duodenum: No hiatal hernia or other gastric abnormality. Normal duodenal course and caliber. --Small bowel: Unremarkable. --Colon: Unremarkable. --Appendix: Not visualized. No right lower quadrant inflammation or free fluid. Vascular/Lymphatic: Atherosclerotic calcification is present within the non-aneurysmal abdominal aorta, without hemodynamically significant stenosis. --No retroperitoneal lymphadenopathy. --No mesenteric lymphadenopathy. --No pelvic or inguinal lymphadenopathy. Reproductive: Unremarkable Other: No ascites or free air. There is a small amount of free fluid in the patient's abdomen and pelvis. There is a fat containing right inguinal hernia the contains a small volume of fluid. Musculoskeletal. No acute displaced fractures. IMPRESSION: 1. Severe right-sided hydronephrosis to the level of the patient's urinary bladder. There is no definite obstructing stone visualized in the distal right ureter. Findings may be secondary to an underlying mass or stricture. Urology consultation is recommended. 2. Irregular appearance of the urinary bladder as before. 3. Trace right-sided pleural effusion with adjacent atelectasis. 4. Ascending thoracic aortic aneurysm measuring up to 4.3 cm. 5. Small amount of free fluid in the patient's abdomen and pelvis. 6. Chronic findings as detailed above. Aortic Atherosclerosis (ICD10-I70.0). Electronically Signed   By: Constance Holster M.D.   On: 04/14/2020 20:46    Scheduled Meds: . heparin  5,000 Units Subcutaneous Q8H   Continuous Infusions: . sodium chloride 500 mL (04/14/20 2112)  . cefTRIAXone (ROCEPHIN)  IV    . sodium bicarbonate (isotonic) 150 mEq in D5W 1000 mL infusion 100 mL/hr at 04/14/20 2140     LOS: 0 days   Time spent: 35 minutes   Real Cona Loann Quill, MD [facility]  If 7PM-7AM, please contact night-coverage [email or web address] 04/15/2020, 11:27 AM

## 2020-04-15 NOTE — Plan of Care (Signed)
  Problem: Health Behavior/Discharge Planning: Goal: Ability to manage health-related needs will improve Outcome: Progressing   

## 2020-04-15 NOTE — Consult Note (Signed)
Reason for Consult: AKI Referring Physician:  Doristine Bosworth, MD  Douglas Wood is an 86 y.o. male with a PMH significant for solitary functioning kidney (congenital absence of left kidney) and bladder cancer who presented to [facility] with a 4 day history of worsening weakness, shortness of breath, and malaise.  He also noted a low grade fever of 99 at home.  In the ED he was noted to be hypotensive at 99/58, tachycardic at 107, febrile at 99.2.  Labs were notable for WBC 20.6, Hgb 12.2, platelets 128, Na 129, K 5.7, BUN 119, Cr 6.17, Ca 8.2, alb 2.8.  CT scan revealed severe right-sided hydronephrosis to the level of the urinary bladder, without obstructing stone.  Urology was consulted and recommended foley catheter placement and admission to [facility].  We were consulted to help further evaluate and manage his AKI and hyperkalemia.  The trend in Scr is seen below.     He has been receiving chemo for his bladder cancer and received his most recent dose 3 weeks ago.    Trend in Creatinine:  Creatinine, Ser  Date/Time Value Ref Range Status  04/15/2020 04:07 AM 6.37 (H) 0.61 - 1.24 mg/dL Final  04/14/2020 07:35 PM 6.17 (H) 0.61 - 1.24 mg/dL Final  03/27/2020 11:18 AM 1.96 (H) 0.61 - 1.24 mg/dL Final  03/06/2020 12:34 PM 1.84 (H) 0.61 - 1.24 mg/dL Final  02/13/2020 02:28 PM 1.19 0.61 - 1.24 mg/dL Final  01/03/2020 11:03 AM 1.00 0.61 - 1.24 mg/dL Final  07/23/2017 06:58 PM 0.88 0.61 - 1.24 mg/dL Final  05/22/2017 07:14 AM 0.90 0.61 - 1.24 mg/dL Final  04/14/2017 10:03 AM 0.80 0.61 - 1.24 mg/dL Final   Creatinine  Date/Time Value Ref Range Status  11/08/2019 11:37 AM 0.97 0.61 - 1.24 mg/dL Final  06/04/2019 09:09 AM 1.00 0.61 - 1.24 mg/dL Final  11/27/2018 12:07 PM 0.97 0.61 - 1.24 mg/dL Final  05/22/2018 08:16 AM 1.01 0.61 - 1.24 mg/dL Final  11/16/2017 11:13 AM 1.04 0.61 - 1.24 mg/dL Final  08/24/2017 12:32 PM 0.93 0.70 - 1.30 mg/dL Final  08/03/2017 09:04 AM 0.86 0.70 - 1.30 mg/dL  Final  07/27/2017 09:02 AM 0.90 0.70 - 1.30 mg/dL Final  07/20/2017 08:36 AM 0.88 0.70 - 1.30 mg/dL Final  07/13/2017 09:17 AM 1.02 0.70 - 1.30 mg/dL Final  07/06/2017 09:12 AM 1.03 0.70 - 1.30 mg/dL Final  06/29/2017 09:42 AM 0.96 0.70 - 1.30 mg/dL Final  06/22/2017 09:04 AM 1.06 0.70 - 1.30 mg/dL Final  06/15/2017 08:55 AM 1.01 0.70 - 1.30 mg/dL Final  06/08/2017 09:19 AM 1.00 0.70 - 1.30 mg/dL Final    PMH:   Past Medical History:  Diagnosis Date  . Arthritis    hands  . Bladder cancer [facility]) urologist-  dr ottelin/  oncologist---dr shadad   dx 04-14-2017 per high grade invasive urothelial carcinoma;   s/p  TURBT;    completed chemo/ radiation 04/ 2019  . Congenital absence of left kidney   . GERD (gastroesophageal reflux disease)   . History of basal cell carcinoma excision    hx multiple BCC of skin removals  . History of cancer chemotherapy    completed 07-2017  . History of chronic prostatitis UROLOGIST-  DR Consuella Lose  . History of external beam radiation therapy    completed 04-/ 2019 for bladder cancer  . History of hypertension    per pt no medication since 2018, blood pressure normalized  . History of kidney stones   .  History of squamous cell carcinoma excision    multiple SCC of skin removals  . Hyperlipidemia   . IBS (irritable bowel syndrome)   . IBS (irritable bowel syndrome)   . Osteopenia   . Solitary kidney    right  . Wears glasses     PSH:   Past Surgical History:  Procedure Laterality Date  . bcg treatment  last done may 2021   x6  . CYSTOSCOPY W/ RETROGRADES Bilateral 01/03/2020   Procedure: CYSTOSCOPY WITH RETROGRADE PYELOGRAM;  Surgeon: Alexis Frock, MD;  Location: [facility];  Service: Urology;  Laterality: Bilateral;  . EXCISION BENIGN LYMPH NODE LEFT CERCIAL REGION  09-11-2013    dr toth  [facility]  . HEMORROIDECTOMY  05/30/2005  . RIGHT URETEROSCOPY W/ STONE EXTRACTION AND STENT PLACEMENT  04-04-2008    dr Everlene Balls  [facility]  .  TRANSURETHRAL RESECTION OF BLADDER TUMOR N/A 04/14/2017   Procedure: TRANSURETHRAL RESECTION OF BLADDER TUMOR  (TURBT);  Surgeon: Kathie Rhodes, MD;  Location: [facility];  Service: Urology;  Laterality: N/A;  . TRANSURETHRAL RESECTION OF BLADDER TUMOR N/A 05/22/2017   Procedure: TRANSURETHRAL RESECTION OF BLADDER TUMOR (TURBT);  Surgeon: Kathie Rhodes, MD;  Location: [facility];  Service: Urology;  Laterality: N/A;  . TRANSURETHRAL RESECTION OF BLADDER TUMOR N/A 12/07/2018   Procedure: CYSTOSCOPY, TRANSURETHRAL RESECTION OF BLADDER TUMOR (TURBT) WITH INSTILLATION OF POST OPERATIVE CHEMOTHERAPY;  Surgeon: Kathie Rhodes, MD;  Location: [place];  Service: Urology;  Laterality: N/A;  ONLY NEEDS 60 MIN  . TRANSURETHRAL RESECTION OF BLADDER TUMOR N/A 07/15/2019   Procedure: TRANSURETHRAL RESECTION OF BLADDER TUMOR (TURBT)/ CYSTOSCOPY;  Surgeon: Kathie Rhodes, MD;  Location: [place];  Service: Urology;  Laterality: N/A;  . TRANSURETHRAL RESECTION OF BLADDER TUMOR N/A 01/03/2020   Procedure: TRANSURETHRAL RESECTION OF BLADDER TUMOR (TURBT);  Surgeon: Alexis Frock, MD;  Location: [facility];  Service: Urology;  Laterality: N/A;  1 HR    Allergies: No Known Allergies  Medications:   Prior to Admission medications   Medication Sig Start Date End Date Taking? Authorizing Provider  acetaminophen (TYLENOL) 325 MG tablet Take 650 mg by mouth every 6 (six) hours as needed for mild pain, moderate pain or headache.   Yes [provider]  diazepam (VALIUM) 5 MG tablet Take 5 mg by mouth at bedtime as needed for anxiety or sedation.   Yes [provider]  Meth-Hyo-M Bl-Na Phos-Ph Sal (URIBEL) 118 MG CAPS Take 1 capsule (118 mg total) by mouth every 8 (eight) hours as needed (for dysuria). 01/03/20  Yes Alexis Frock, MD  MYRBETRIQ 50 MG TB24 tablet Take 50 mg by mouth daily. 03/06/20  Yes [provider]   omeprazole (PRILOSEC) 20 MG capsule Take 20 mg by mouth every evening.    Yes [provider]  prochlorperazine (COMPAZINE) 10 MG tablet Take 1 tablet (10 mg total) by mouth every 6 (six) hours as needed for nausea or vomiting. 02/06/20  Yes Wyatt Portela, MD  silodosin (RAPAFLO) 8 MG CAPS capsule Take 8 mg by mouth every evening.   Yes [provider]  simvastatin (ZOCOR) 10 MG tablet Take 10 mg by mouth every evening. 07/29/14  Yes [provider]  triamcinolone (KENALOG) 0.1 % Apply topically 2 (two) times daily as needed for rash. 03/05/20  Yes [provider]  ibandronate (BONIVA) 150 MG tablet Take 150 mg by mouth every 30 (thirty) days. Take in  the morning with a full glass of water, on an empty stomach, and do not take anything else by mouth or lie down for the next 30 min.    [provider]  traMADol (ULTRAM) 50 MG tablet Take 1 tablet (50 mg total) by mouth every 6 (six) hours as needed for moderate pain. Post-operatively Patient not taking: No sig reported 01/03/20 01/02/21  Alexis Frock, MD    Inpatient medications: . sodium zirconium cyclosilicate  10 g Oral Daily    Discontinued Meds:   Medications Discontinued During This Encounter  Medication Reason  . sodium chloride 0.9 % bolus 1,000 mL   . sodium chloride 0.9 % bolus 1,000 mL   . sodium chloride 0.9 % bolus 1,000 mL   . 0.9 %  sodium chloride infusion   . 0.9 %  sodium chloride infusion   . Meth-Hyo-M Bl-Na Phos-Ph Sal (URIBEL) 123456 MG CAPS Duplicate  . ceFEPIme (MAXIPIME) 1 g in sodium chloride 0.9 % 100 mL IVPB   . vancomycin variable dose per unstable renal function (pharmacist dosing)   . heparin injection 5,000 Units   . sodium zirconium cyclosilicate (LOKELMA) packet 10 g Discontinued by provider    Social History:  reports that he quit smoking about 56 years ago. His smoking use included cigarettes. He has a 10.00 pack-year smoking history. He has never used  smokeless tobacco. He reports current alcohol use. He reports that he does not use drugs.  Family History:   Family History  Problem Relation Age of Onset  . Prostate cancer Father   . Bladder Cancer Brother        bladder/dx 30 years ago    Pertinent items are noted in HPI. Weight change:   Intake/Output Summary (Last 24 hours) at 04/15/2020 1333 Last data filed at 04/15/2020 0948 Gross per 24 hour  Intake 1100 ml  Output 525 ml  Net 575 ml   BP (!) 107/93 (BP Location: Right Arm)   Pulse (!) 101   Temp (!) 96.9 F (36.1 C) (Axillary)   Resp (!) 22   Ht 5' 9.5" (1.765 m)   Wt 64.4 kg   SpO2 97%   BMI 20.67 kg/m  Vitals:   04/15/20 0400 04/15/20 0500 04/15/20 0939 04/15/20 1246  BP: 129/72 135/74 135/71 (!) 107/93  Pulse: (!) 101 (!) 106  (!) 101  Resp: 18 18 (!) 21 (!) 22  Temp: 98 F (36.7 C) (!) 97.5 F (36.4 C) (!) 97.5 F (36.4 C) (!) 96.9 F (36.1 C)  TempSrc: Oral Oral Oral Axillary  SpO2: 98% 95% 95% 97%  Weight:      Height:         General appearance: alert, cooperative and no distress Head: Normocephalic, without obvious abnormality, atraumatic Eyes: negative findings: lids and lashes normal, conjunctivae and sclerae normal and corneas clear Resp: clear to auscultation bilaterally Cardio: tachycardic, no rub GI: soft, non-tender; bowel sounds normal; no masses,  no organomegaly Extremities: extremities normal, atraumatic, no cyanosis or edema  Labs: Basic Metabolic Panel: Recent Labs  Lab 04/14/20 1935 04/15/20 0407  NA 129* 129*  K 5.7* 5.4*  CL 98 98  CO2 11* 13*  GLUCOSE 110* 120*  BUN 119* 125*  CREATININE 6.17* 6.37*  ALBUMIN 2.8*  --   CALCIUM 8.2* 7.5*   Liver Function Tests: Recent Labs  Lab 04/14/20 1935  AST 45*  ALT 43  ALKPHOS 182*  BILITOT 1.0  PROT 7.6  ALBUMIN 2.8*  No results for input(s): LIPASE, AMYLASE in the last 168 hours. No results for input(s): AMMONIA in the last 168 hours. CBC: Recent Labs  Lab  04/14/20 1935 04/15/20 0407  WBC 20.6* 21.6*  NEUTROABS 18.4*  --   HGB 12.2* 9.4*  HCT 35.4* 26.9*  MCV 85.9 83.8  PLT 128* 99*   PT/INR: @LABRCNTIP (inr:5) Cardiac Enzymes: )No results for input(s): CKTOTAL, CKMB, CKMBINDEX, TROPONINI in the last 168 hours. CBG: No results for input(s): GLUCAP in the last 168 hours.  Iron Studies: No results for input(s): IRON, TIBC, TRANSFERRIN, FERRITIN in the last 168 hours.  Xrays/Other Studies: US RENAL  Result Date: 04/15/2020 CLINICAL DATA:  Acute renal failure EXAM: RENAL / URINARY TRACT ULTRASOUND COMPLETE COMPARISON:  Renal stone CT from yesterday FINDINGS: Right Kidney: Renal measurements: 15 x 7 x 7 cm = volume: 380 mL. Improved hydronephrosis. No evidence of mass or collection Left Kidney: Absent Bladder: Thick walled bladder which is collapsed around a Foley catheter. IMPRESSION: 1. Mild and improved hydronephrosis of the solitary right kidney after bladder drainage. 2. Thick walled bladder. Electronically Signed   By: Monte Fantasia M.D.   On: 04/15/2020 04:53   DG Chest Port 1 View  Result Date: 04/14/2020 CLINICAL DATA:  Generalized weakness with shortness of breath and cough x4 days. EXAM: PORTABLE CHEST 1 VIEW COMPARISON:  September 28, 2015 FINDINGS: Very mild atelectasis is seen within the left lung base. There is no evidence of acute infiltrate, pleural effusion or pneumothorax. The heart size and mediastinal contours are within normal limits. The visualized skeletal structures are unremarkable. IMPRESSION: Very mild left basilar atelectasis. Electronically Signed   By: Virgina Norfolk M.D.   On: 04/14/2020 19:45   CT Renal Stone Study  Result Date: 04/14/2020 CLINICAL DATA:  Flank pain. Generalized weakness and shortness of breath. EXAM: CT ABDOMEN AND PELVIS WITHOUT CONTRAST TECHNIQUE: Multidetector CT imaging of the abdomen and pelvis was performed following the standard protocol without IV contrast. COMPARISON:  November 08, 2018  FINDINGS: Lower chest: There is a trace right-sided pleural effusion with adjacent atelectasis.There appears to be an ascending thoracic aortic aneurysm measuring approximately 4.3 cm. This was previously described. The intracardiac blood pool is hypodense relative to the adjacent myocardium consistent with anemia. Hepatobiliary: The liver is normal. Normal gallbladder.There is no biliary ductal dilation. Pancreas: Normal contours without ductal dilatation. No peripancreatic fluid collection. Spleen: Unremarkable. Adrenals/Urinary Tract: --Adrenal glands: Unremarkable. --Right kidney/ureter: There is severe right-sided hydronephrosis to the level of the patient's urinary bladder. There is no definite obstructing stone visualized in the distal right ureter. --Left kidney/ureter: The left kidney is absent. --Urinary bladder: There is bladder wall thickening with bladder wall calcifications as before. Stomach/Bowel: --Stomach/Duodenum: No hiatal hernia or other gastric abnormality. Normal duodenal course and caliber. --Small bowel: Unremarkable. --Colon: Unremarkable. --Appendix: Not visualized. No right lower quadrant inflammation or free fluid. Vascular/Lymphatic: Atherosclerotic calcification is present within the non-aneurysmal abdominal aorta, without hemodynamically significant stenosis. --No retroperitoneal lymphadenopathy. --No mesenteric lymphadenopathy. --No pelvic or inguinal lymphadenopathy. Reproductive: Unremarkable Other: No ascites or free air. There is a small amount of free fluid in the patient's abdomen and pelvis. There is a fat containing right inguinal hernia the contains a small volume of fluid. Musculoskeletal. No acute displaced fractures. IMPRESSION: 1. Severe right-sided hydronephrosis to the level of the patient's urinary bladder. There is no definite obstructing stone visualized in the distal right ureter. Findings may be secondary to an underlying mass or stricture. Urology consultation is  recommended. 2. Irregular appearance of the urinary bladder as before. 3. Trace right-sided pleural effusion with adjacent atelectasis. 4. Ascending thoracic aortic aneurysm measuring up to 4.3 cm. 5. Small amount of free fluid in the patient's abdomen and pelvis. 6. Chronic findings as detailed above. Aortic Atherosclerosis (ICD10-I70.0). Electronically Signed   By: Constance Holster M.D.   On: 04/14/2020 20:46     Assessment/Plan: 1.  AKI due to obstructive uropathy- possibly related to bladder cancer.  Foley catheter placed without much improvement of renal function but has had gross hematuria.  Renal US with improvement of right hydronephrosis. 1. No urgent indication for dialysis at this time and hopefully can be managed conservatively. 2. He is making some urine but need to make sure his hydro is responding to foley or will need PNT placement tomorrow. 3. Avoid nephrotoxic agents such as IV contrast, NSAIDs/Cox-II I's, phosphate containing enemas. 4. Renal dose meds. 2. Hyperkalemia- treated with bicarb and lokelma.  Also given IV calcium gluconate in ED.  Currently on bicarb drip. Will repeat lokelma and follow. 3. Pyelonephritis with sepsis- given rocephin and vanco.  Blood cultures + for E. Coli and Enterobacterales.  Vanco stopped and continued on rocephin. 4. Obstructive uropathy- in solitary functioning kidney.  Urology following and if no significant improvement may require percutaneous nephrostomy tube tomorrow.  Will repeat US later tonight. 5. Hyponatremia- presumably due to #1.  Follow with IVF's 6. Anion gap metabolic acidosis- due to #1.  Currently on isotonic bicarb drip.  Continue to follow 7. Anemia of malignancy- follow H/H. 8. Bladder cancer- followed by Drs. Manny and Hilliard.  On chemo but high grade lesion.  Consider palliative care consult to help set goals/limits of care.   Douglas Wood 04/15/2020, 1:33 PM

## 2020-04-15 NOTE — Consult Note (Signed)
I have seen the patient and examined him. He is making urine, which is blood-tinged. He is having no significant flank pain. Unfortunately, his renal function has not improved. I recommend that after the Foley has been placed that we repeat a renal ultrasound to ensure that his hydronephrosis improves. If not, the patient likely will need a nephrostomy tube, which can be placed tomorrow. This is in place of the stent, which would likely be exceedingly difficult given the radiation to that area.    Full consult note will follow.

## 2020-04-15 NOTE — Consult Note (Signed)
I have been asked to see the patient by Dr. Harrold Donath, for evaluation and management of right hydronephrosis and acute renal failure.  History of present illness: 35M, very pleasant, who has an extensive bladder cancer history (first tumor resected 36 yrs ago) and ultimately developed muscle invasive disease that was treated with chemo/radiation in 2019.  He then had recurrence several times since then.  His last resection was in September 2021.  He has since been treated with palliative chemotherapy in attempt to limit his local recurrences as well as metastatic progression.  He is treated by Dr. Tresa Moore and Dr. Alen Blew.  He was born with a solitary kidney.  He has also had a TURP and is on Rapaflo 8mg .  The patient states that since his last bladder tumor resection he hasnt been well.  He is weaker and more fatigued.  He has started leaking, mostly at night.   Felt as if he was emptying his bladder well.   Denies any hematuria or dysuria.  He was noted to have a fever about a week ago and notably weaker.  He ultimately presented to the ED for further eval.  In the ED he was found to be in acute renal failure with severe hydro nephrosis down to the level of the bladder.  His bladder was distended.   He had an elevated WBC.  A foley catheter was placed and soon thereafter a renal u/s was performed which showed improvement in his hydro.  Blood cultures have since returned with GNRs.   Review of systems: A 12 point comprehensive review of systems was obtained and is negative unless otherwise stated in the history of present illness.  Patient Active Problem List   Diagnosis Date Noted  . Hyperkalemia 04/15/2020  . Pyelonephritis 04/15/2020  . Hydronephrosis of right kidney 04/15/2020  . Sepsis ([place]) 04/15/2020  . Hyponatremia 04/15/2020  . ARF (acute renal failure) ([place]) 04/14/2020  . Goals of care, counseling/discussion 02/06/2020  . Solitary kidney   . Rectal pain 07/11/2017  . Cancer of  lateral wall of urinary bladder ([place]) 05/12/2017  . Lymphadenopathy of left cervical region 05/30/2013  . Left foot pain 10/15/2012  . ABNORMALITY OF GAIT 03/03/2010  . HIP PAIN, RIGHT 02/09/2010  . ITBS, LEFT KNEE 09/02/2009  . OTHER ACQUIRED DEFORMITY OF ANKLE AND FOOT OTHER 09/02/2009    No current facility-administered medications on file prior to encounter.   Current Outpatient Medications on File Prior to Encounter  Medication Sig Dispense Refill  . acetaminophen (TYLENOL) 325 MG tablet Take 650 mg by mouth every 6 (six) hours as needed for mild pain, moderate pain or headache.    . diazepam (VALIUM) 5 MG tablet Take 5 mg by mouth at bedtime as needed for anxiety or sedation.    . Meth-Hyo-M Bl-Na Phos-Ph Sal (URIBEL) 118 MG CAPS Take 1 capsule (118 mg total) by mouth every 8 (eight) hours as needed (for dysuria). 20 capsule 11  . MYRBETRIQ 50 MG TB24 tablet Take 50 mg by mouth daily.    Marland Kitchen omeprazole (PRILOSEC) 20 MG capsule Take 20 mg by mouth every evening.     . prochlorperazine (COMPAZINE) 10 MG tablet Take 1 tablet (10 mg total) by mouth every 6 (six) hours as needed for nausea or vomiting. 30 tablet 0  . silodosin (RAPAFLO) 8 MG CAPS capsule Take 8 mg by mouth every evening.    . simvastatin (ZOCOR) 10 MG tablet Take 10 mg by mouth every evening.  0  .  triamcinolone (KENALOG) 0.1 % Apply topically 2 (two) times daily as needed for rash.    . ibandronate (BONIVA) 150 MG tablet Take 150 mg by mouth every 30 (thirty) days. Take in the morning with a full glass of water, on an empty stomach, and do not take anything else by mouth or lie down for the next 30 min.    . traMADol (ULTRAM) 50 MG tablet Take 1 tablet (50 mg total) by mouth every 6 (six) hours as needed for moderate pain. Post-operatively (Patient not taking: No sig reported) 20 tablet 0    Past Medical History:  Diagnosis Date  . Arthritis    hands  . Bladder cancer [facility]) urologist-  dr ottelin/  oncologist---dr  shadad   dx 04-14-2017 per high grade invasive urothelial carcinoma;   s/p  TURBT;    completed chemo/ radiation 04/ 2019  . Congenital absence of left kidney   . GERD (gastroesophageal reflux disease)   . History of basal cell carcinoma excision    hx multiple BCC of skin removals  . History of cancer chemotherapy    completed 07-2017  . History of chronic prostatitis UROLOGIST-  DR Consuella Lose  . History of external beam radiation therapy    completed 04-/ 2019 for bladder cancer  . History of hypertension    per pt no medication since 2018, blood pressure normalized  . History of kidney stones   . History of squamous cell carcinoma excision    multiple SCC of skin removals  . Hyperlipidemia   . IBS (irritable bowel syndrome)   . IBS (irritable bowel syndrome)   . Osteopenia   . Solitary kidney    right  . Wears glasses     Past Surgical History:  Procedure Laterality Date  . bcg treatment  last done may 2021   x6  . CYSTOSCOPY W/ RETROGRADES Bilateral 01/03/2020   Procedure: CYSTOSCOPY WITH RETROGRADE PYELOGRAM;  Surgeon: Alexis Frock, MD;  Location: [facility];  Service: Urology;  Laterality: Bilateral;  . EXCISION BENIGN LYMPH NODE LEFT CERCIAL REGION  09-11-2013    dr toth  [facility]  . HEMORROIDECTOMY  05/30/2005  . RIGHT URETEROSCOPY W/ STONE EXTRACTION AND STENT PLACEMENT  04-04-2008    dr Everlene Balls  [facility]  . TRANSURETHRAL RESECTION OF BLADDER TUMOR N/A 04/14/2017   Procedure: TRANSURETHRAL RESECTION OF BLADDER TUMOR  (TURBT);  Surgeon: Kathie Rhodes, MD;  Location: [facility];  Service: Urology;  Laterality: N/A;  . TRANSURETHRAL RESECTION OF BLADDER TUMOR N/A 05/22/2017   Procedure: TRANSURETHRAL RESECTION OF BLADDER TUMOR (TURBT);  Surgeon: Kathie Rhodes, MD;  Location: [facility];  Service: Urology;  Laterality: N/A;  . TRANSURETHRAL RESECTION OF BLADDER TUMOR N/A 12/07/2018   Procedure: CYSTOSCOPY, TRANSURETHRAL RESECTION OF  BLADDER TUMOR (TURBT) WITH INSTILLATION OF POST OPERATIVE CHEMOTHERAPY;  Surgeon: Kathie Rhodes, MD;  Location: [place];  Service: Urology;  Laterality: N/A;  ONLY NEEDS 60 MIN  . TRANSURETHRAL RESECTION OF BLADDER TUMOR N/A 07/15/2019   Procedure: TRANSURETHRAL RESECTION OF BLADDER TUMOR (TURBT)/ CYSTOSCOPY;  Surgeon: Kathie Rhodes, MD;  Location: [place];  Service: Urology;  Laterality: N/A;  . TRANSURETHRAL RESECTION OF BLADDER TUMOR N/A 01/03/2020   Procedure: TRANSURETHRAL RESECTION OF BLADDER TUMOR (TURBT);  Surgeon: Alexis Frock, MD;  Location: [facility];  Service: Urology;  Laterality: N/A;  1 HR    Social History   Tobacco Use  . Smoking status: Former Smoker  Packs/day: 1.00    Years: 10.00    Pack years: 10.00    Types: Cigarettes    Quit date: 04/11/1964    Years since quitting: 56.0  . Smokeless tobacco: Never Used  Vaping Use  . Vaping Use: Never used  Substance Use Topics  . Alcohol use: Yes    Comment: 1-2 shots vodka per day  . Drug use: No    Family History  Problem Relation Age of Onset  . Prostate cancer Father   . Bladder Cancer Brother        bladder/dx 30 years ago    PE: Vitals:   04/15/20 0400 04/15/20 0500 04/15/20 0939 04/15/20 1246  BP: 129/72 135/74 135/71 (!) 107/93  Pulse: (!) 101 (!) 106  (!) 101  Resp: 18 18 (!) 21 (!) 22  Temp: 98 F (36.7 C) (!) 97.5 F (36.4 C) (!) 97.5 F (36.4 C) (!) 96.9 F (36.1 C)  TempSrc: Oral Oral Oral Axillary  SpO2: 98% 95% 95% 97%  Weight:      Height:       Patient appears to be in no acute distress  patient is alert and oriented x3 Atraumatic normocephalic head No cervical or supraclavicular lymphadenopathy appreciated No increased work of breathing, no audible wheezes/rhonchi Regular sinus rhythm/rate Abdomen is soft, nontender, nondistended, no CVA or suprapubic tenderness Foley draining blood tinged urine. Lower extremities are  symmetric without appreciable edema Grossly neurologically intact No identifiable skin lesions  Recent Labs    04/14/20 1935 04/15/20 0407  WBC 20.6* 21.6*  HGB 12.2* 9.4*  HCT 35.4* 26.9*   Recent Labs    04/14/20 1935 04/15/20 0407  NA 129* 129*  K 5.7* 5.4*  CL 98 98  CO2 11* 13*  GLUCOSE 110* 120*  BUN 119* 125*  CREATININE 6.17* 6.37*  CALCIUM 8.2* 7.5*   Recent Labs    04/15/20 0407  INR 1.3*   No results for input(s): LABURIN in the last 72 hours. Results for orders placed or performed during the hospital encounter of 04/14/20  Blood culture (routine x 2)     Status: None (Preliminary result)   Collection Time: 04/14/20  7:34 PM   Specimen: BLOOD  Result Value Ref Range Status   Specimen Description   Final    BLOOD BLOOD LEFT FOREARM Performed at [facility], [address] [place], [place] [address]    Special Requests   Final    BOTTLES DRAWN AEROBIC AND ANAEROBIC Blood Culture adequate volume Performed at [facility], [address] [place], [place] [address]    Culture  Setup Time   Final    GRAM NEGATIVE RODS IN BOTH AEROBIC AND ANAEROBIC BOTTLES Organism ID to follow CRITICAL RESULT CALLED TO, READ BACK BY AND VERIFIED WITH: EMILY SINCLAIR PHARMD @1024  04/15/20 EB Performed at [facility], [place] [address] [place], [place] [address]    Culture GRAM NEGATIVE RODS  Final   Report Status PENDING  Incomplete  Blood Culture ID Panel (Reflexed)     Status: Abnormal   Collection Time: 04/14/20  7:34 PM  Result Value Ref Range Status   Enterococcus faecalis NOT DETECTED NOT DETECTED Final   Enterococcus Faecium NOT DETECTED NOT DETECTED Final   Listeria monocytogenes NOT DETECTED NOT DETECTED Final   Staphylococcus species NOT DETECTED NOT DETECTED Final   Staphylococcus aureus (BCID) NOT DETECTED NOT DETECTED Final   Staphylococcus epidermidis NOT DETECTED NOT DETECTED Final  Staphylococcus lugdunensis NOT DETECTED  NOT DETECTED Final   Streptococcus species NOT DETECTED NOT DETECTED Final   Streptococcus agalactiae NOT DETECTED NOT DETECTED Final   Streptococcus pneumoniae NOT DETECTED NOT DETECTED Final   Streptococcus pyogenes NOT DETECTED NOT DETECTED Final   A.calcoaceticus-baumannii NOT DETECTED NOT DETECTED Final   Bacteroides fragilis NOT DETECTED NOT DETECTED Final   Enterobacterales DETECTED (A) NOT DETECTED Final    Comment: Enterobacterales represent a large order of gram negative bacteria, not a single organism. CRITICAL RESULT CALLED TO, READ BACK BY AND VERIFIED WITH: EMILY SINCLAIR PHARMD @1024  04/15/20 EB    Enterobacter cloacae complex NOT DETECTED NOT DETECTED Final   Escherichia coli DETECTED (A) NOT DETECTED Final    Comment: CRITICAL RESULT CALLED TO, READ BACK BY AND VERIFIED WITH: EMILY SINCLAIR PHARMD @1024  04/15/20 EB    Klebsiella aerogenes NOT DETECTED NOT DETECTED Final   Klebsiella oxytoca NOT DETECTED NOT DETECTED Final   Klebsiella pneumoniae NOT DETECTED NOT DETECTED Final   Proteus species NOT DETECTED NOT DETECTED Final   Salmonella species NOT DETECTED NOT DETECTED Final   Serratia marcescens NOT DETECTED NOT DETECTED Final   Haemophilus influenzae NOT DETECTED NOT DETECTED Final   Neisseria meningitidis NOT DETECTED NOT DETECTED Final   Pseudomonas aeruginosa NOT DETECTED NOT DETECTED Final   Stenotrophomonas maltophilia NOT DETECTED NOT DETECTED Final   Candida albicans NOT DETECTED NOT DETECTED Final   Candida auris NOT DETECTED NOT DETECTED Final   Candida glabrata NOT DETECTED NOT DETECTED Final   Candida krusei NOT DETECTED NOT DETECTED Final   Candida parapsilosis NOT DETECTED NOT DETECTED Final   Candida tropicalis NOT DETECTED NOT DETECTED Final   Cryptococcus neoformans/gattii NOT DETECTED NOT DETECTED Final   CTX-M ESBL NOT DETECTED NOT DETECTED Final   Carbapenem resistance IMP NOT DETECTED NOT DETECTED Final   Carbapenem resistance KPC NOT  DETECTED NOT DETECTED Final   Carbapenem resistance NDM NOT DETECTED NOT DETECTED Final   Carbapenem resist OXA 48 LIKE NOT DETECTED NOT DETECTED Final   Carbapenem resistance VIM NOT DETECTED NOT DETECTED Final    Comment: Performed at [facility], [address] [place], [place] [address]  Resp Panel by RT-PCR (Flu A&B, Covid) Nasopharyngeal Swab     Status: None   Collection Time: 04/14/20  7:35 PM   Specimen: Nasopharyngeal Swab; Nasopharyngeal(NP) swabs in vial transport medium  Result Value Ref Range Status   SARS Coronavirus 2 by RT PCR NEGATIVE NEGATIVE Final    Comment: (NOTE) SARS-CoV-2 target nucleic acids are NOT DETECTED.  The SARS-CoV-2 RNA is generally detectable in upper respiratory specimens during the acute phase of infection. The lowest concentration of SARS-CoV-2 viral copies this assay can detect is 138 copies/mL. A negative result does not preclude SARS-Cov-2 infection and should not be used as the sole basis for treatment or other patient management decisions. A negative result may occur with  improper specimen collection/handling, submission of specimen other than nasopharyngeal swab, presence of viral mutation(s) within the areas targeted by this assay, and inadequate number of viral copies(<138 copies/mL). A negative result must be combined with clinical observations, patient history, and epidemiological information. The expected result is Negative.  Fact Sheet for Patients:  EntrepreneurPulse.com.au  Fact Sheet for Healthcare Providers:  IncredibleEmployment.be  This test is no t yet approved or cleared by the [place] FDA and  has been authorized for detection and/or diagnosis of SARS-CoV-2 by FDA under an Emergency Use Authorization (EUA). This EUA will  remain  in effect (meaning this test can be used) for the duration of the COVID-19 declaration under Section 564(b)(1) of the Act, 21 U.S.C.section  360bbb-3(b)(1), unless the authorization is terminated  or revoked sooner.       Influenza A by PCR NEGATIVE NEGATIVE Final   Influenza B by PCR NEGATIVE NEGATIVE Final    Comment: (NOTE) The Xpert Xpress SARS-CoV-2/FLU/RSV plus assay is intended as an aid in the diagnosis of influenza from Nasopharyngeal swab specimens and should not be used as a sole basis for treatment. Nasal washings and aspirates are unacceptable for Xpert Xpress SARS-CoV-2/FLU/RSV testing.  Fact Sheet for Patients: EntrepreneurPulse.com.au  Fact Sheet for Healthcare Providers: IncredibleEmployment.be  This test is not yet approved or cleared by the [place] FDA and has been authorized for detection and/or diagnosis of SARS-CoV-2 by FDA under an Emergency Use Authorization (EUA). This EUA will remain in effect (meaning this test can be used) for the duration of the COVID-19 declaration under Section 564(b)(1) of the Act, 21 U.S.C. section 360bbb-3(b)(1), unless the authorization is terminated or revoked.  Performed at [facility], [address] [place], [place] [address]   Blood culture (routine x 2)     Status: None (Preliminary result)   Collection Time: 04/14/20  8:00 PM   Specimen: BLOOD  Result Value Ref Range Status   Specimen Description   Final    BLOOD RIGHT ANTECUBITAL Performed at [facility], [address] [place], [place] [address]    Special Requests   Final    BOTTLES DRAWN AEROBIC AND ANAEROBIC Blood Culture adequate volume Performed at [facility], [address] [place], [place] [address]    Culture  Setup Time   Final    GRAM NEGATIVE RODS IN BOTH AEROBIC AND ANAEROBIC BOTTLES CRITICAL VALUE NOTED.  VALUE IS CONSISTENT WITH PREVIOUSLY REPORTED AND CALLED VALUE. Performed at [facility], [place] [address], [place], [place] [address]    Culture GRAM NEGATIVE RODS  Final   Report Status PENDING   Incomplete    Imaging: I have reviewed his CT scan as well as his u/s.  He has a distended bladder and right hydro all the way to he bladder.  His U/S shows improvement in his hydro.  Imp:  88 with advanced bladder cancer, urinary retention and ureteral reflux, bacteremia and acute on chronic renal failure.  Fortunately he is comfortable.  Recommendations: Would repeat the U/S later this evening to allow more time in between placing the foley catheter and evaluation for resolution of his hydro.  I suspect that he has reflux of ureter related to his prior tumor resections and the radiation.  However, he may have a recurrence of his cancer at the insertion of his UO which could be causing mild obstruction.  He is making urine suggesting that he is at most mildly obstructed.    If the hydro hasn't resolved and the patient is agreeable to it, would recommend placement of a nephrostomy tube tomorrow.  Consider a palliative care consult.  We will follow along.   Ardis Hughs

## 2020-04-15 NOTE — Progress Notes (Signed)
PHARMACY - PHYSICIAN COMMUNICATION CRITICAL VALUE ALERT - BLOOD CULTURE IDENTIFICATION (BCID)  Douglas Wood is an 86 y.o. male who presented to [facility] on 04/14/2020 with a chief complaint of generalized weakness.   Assessment: 86 year old male with hx of bladder cancer on chemotherapy- Last chemotherapy 3 weeks ago. Found to have acute renal failure with right-sided hyrdonephrosis. Urology is seeing with plans for possible nephrostomy tube. Now with E Coli in 4/4 blood cultures with no resistance detected.   Name of physician (or Provider) Contacted: Rinka Pahwani  Current antibiotics: Vanc/Cefepime   Changes to prescribed antibiotics recommended:  Recommendations accepted by provider  Change to ceftriaxone 2 gm IV Q 24 hours   Results for orders placed or performed during the hospital encounter of 04/14/20  Blood Culture ID Panel (Reflexed) (Collected: 04/14/2020  7:34 PM)  Result Value Ref Range   Enterococcus faecalis NOT DETECTED NOT DETECTED   Enterococcus Faecium NOT DETECTED NOT DETECTED   Listeria monocytogenes NOT DETECTED NOT DETECTED   Staphylococcus species NOT DETECTED NOT DETECTED   Staphylococcus aureus (BCID) NOT DETECTED NOT DETECTED   Staphylococcus epidermidis NOT DETECTED NOT DETECTED   Staphylococcus lugdunensis NOT DETECTED NOT DETECTED   Streptococcus species NOT DETECTED NOT DETECTED   Streptococcus agalactiae NOT DETECTED NOT DETECTED   Streptococcus pneumoniae NOT DETECTED NOT DETECTED   Streptococcus pyogenes NOT DETECTED NOT DETECTED   A.calcoaceticus-baumannii NOT DETECTED NOT DETECTED   Bacteroides fragilis NOT DETECTED NOT DETECTED   Enterobacterales DETECTED (A) NOT DETECTED   Enterobacter cloacae complex NOT DETECTED NOT DETECTED   Escherichia coli DETECTED (A) NOT DETECTED   Klebsiella aerogenes NOT DETECTED NOT DETECTED   Klebsiella oxytoca NOT DETECTED NOT DETECTED   Klebsiella pneumoniae NOT DETECTED NOT DETECTED   Proteus species  NOT DETECTED NOT DETECTED   Salmonella species NOT DETECTED NOT DETECTED   Serratia marcescens NOT DETECTED NOT DETECTED   Haemophilus influenzae NOT DETECTED NOT DETECTED   Neisseria meningitidis NOT DETECTED NOT DETECTED   Pseudomonas aeruginosa NOT DETECTED NOT DETECTED   Stenotrophomonas maltophilia NOT DETECTED NOT DETECTED   Candida albicans NOT DETECTED NOT DETECTED   Candida auris NOT DETECTED NOT DETECTED   Candida glabrata NOT DETECTED NOT DETECTED   Candida krusei NOT DETECTED NOT DETECTED   Candida parapsilosis NOT DETECTED NOT DETECTED   Candida tropicalis NOT DETECTED NOT DETECTED   Cryptococcus neoformans/gattii NOT DETECTED NOT DETECTED   CTX-M ESBL NOT DETECTED NOT DETECTED   Carbapenem resistance IMP NOT DETECTED NOT DETECTED   Carbapenem resistance KPC NOT DETECTED NOT DETECTED   Carbapenem resistance NDM NOT DETECTED NOT DETECTED   Carbapenem resist OXA 48 LIKE NOT DETECTED NOT DETECTED   Carbapenem resistance VIM NOT DETECTED NOT DETECTED    Jimmy Footman, PharmD, BCPS, BCIDP Infectious Diseases Clinical Pharmacist Phone: [phone] 04/15/2020  10:26 AM

## 2020-04-15 NOTE — H&P (Signed)
History and Physical    Douglas Wood YHC:[id number] DOB: 1931/06/12 DOA: 04/14/2020  PCP: [name], MD   Patient coming from:  [facility] ER in transfer  Chief Complaint: Generalized weakness  HPI: Douglas Wood is a 86 y.o. male with medical history significant for bladder cancer on chemotherapy. His last chemotherapy session was about 3 weeks ago. He presents  with complaint of weakness that has worsened over the past few days.  He has had a hard time getting around and doing activities due to the weakness. He was running a fever 5 days ago and tested negative for Covid and flu at an urgent care.  Patient states that his fever resolved now he has low-grade temperature of 99 at home.  He states that he is just weak all over.  He has some subjective shortness of breath but no chest pain or pressure. He has had a mild intermittent dry cough.  He states that he is urinating less but denies any pain with urination. He does have some urinary incontinence. He has not had diarrhea, nausea or vomiting.  In the emergency room he is found to have acute renal failure with a creatinine of 6.17 from a baseline of 1.8 a month ago.  He is also found to have severe right-sided hydronephrosis with possible obstruction or stricture of the ureter at the bladder. Case was discussed with nephrology and Urology be ER provider.   Review of Systems:  General: Reports generalized weakness. Reports intermittent fever, chills>Denies night sweats.  Denies dizziness.  Denies change in appetite HENT: Denies head trauma, headache, denies change in hearing, tinnitus.  Denies nasal congestion or bleeding.  Denies sore throat.  Denies difficulty swallowing Eyes: Denies blurry vision, pain in eye, drainage.  Denies discoloration of eyes. Neck: Denies pain.  Denies swelling.  Denies pain with movement. Cardiovascular: Denies chest pain, palpitations.  Denies edema.  Denies orthopnea Respiratory: Denies shortness of  breath, cough.  Denies wheezing.  Denies sputum production Gastrointestinal: Denies abdominal pain, swelling.  Denies nausea, vomiting, diarrhea.  Denies melena.  Denies hematemesis. Musculoskeletal: Denies limitation of movement.  Denies deformity or swelling.  Denies pain.  Denies arthralgias or myalgias. Genitourinary: Denies pelvic pain.  Denies urinary frequency or hesitancy.  Denies dysuria.  Skin: Denies rash.  Denies petechiae, purpura, ecchymosis. Neurological: Denies headache.  Denies syncope. Denies seizure activity. Denies paresthesia.  Denies slurred speech, drooping face.  Denies visual change. Psychiatric: Denies depression, anxiety.  Denies suicidal thoughts or ideation.  Denies hallucinations.  Past Medical History:  Diagnosis Date  . Arthritis    hands  . Bladder cancer [facility]) urologist-  dr ottelin/  oncologist---dr shadad   dx 04-14-2017 per high grade invasive urothelial carcinoma;   s/p  TURBT;    completed chemo/ radiation 04/ 2019  . Congenital absence of left kidney   . GERD (gastroesophageal reflux disease)   . History of basal cell carcinoma excision    hx multiple BCC of skin removals  . History of cancer chemotherapy    completed 07-2017  . History of chronic prostatitis UROLOGIST-  DR Consuella Lose  . History of external beam radiation therapy    completed 04-/ 2019 for bladder cancer  . History of hypertension    per pt no medication since 2018, blood pressure normalized  . History of kidney stones   . History of squamous cell carcinoma excision    multiple SCC of skin removals  . Hyperlipidemia   . IBS (  irritable bowel syndrome)   . IBS (irritable bowel syndrome)   . Osteopenia   . Solitary kidney    right  . Wears glasses     Past Surgical History:  Procedure Laterality Date  . bcg treatment  last done may 2021   x6  . CYSTOSCOPY W/ RETROGRADES Bilateral 01/03/2020   Procedure: CYSTOSCOPY WITH RETROGRADE PYELOGRAM;  Surgeon: Alexis Frock, MD;   Location: [facility];  Service: Urology;  Laterality: Bilateral;  . EXCISION BENIGN LYMPH NODE LEFT CERCIAL REGION  09-11-2013    dr toth  [facility]  . HEMORROIDECTOMY  05/30/2005  . RIGHT URETEROSCOPY W/ STONE EXTRACTION AND STENT PLACEMENT  04-04-2008    dr Everlene Balls  [facility]  . TRANSURETHRAL RESECTION OF BLADDER TUMOR N/A 04/14/2017   Procedure: TRANSURETHRAL RESECTION OF BLADDER TUMOR  (TURBT);  Surgeon: Kathie Rhodes, MD;  Location: [facility];  Service: Urology;  Laterality: N/A;  . TRANSURETHRAL RESECTION OF BLADDER TUMOR N/A 05/22/2017   Procedure: TRANSURETHRAL RESECTION OF BLADDER TUMOR (TURBT);  Surgeon: Kathie Rhodes, MD;  Location: [facility];  Service: Urology;  Laterality: N/A;  . TRANSURETHRAL RESECTION OF BLADDER TUMOR N/A 12/07/2018   Procedure: CYSTOSCOPY, TRANSURETHRAL RESECTION OF BLADDER TUMOR (TURBT) WITH INSTILLATION OF POST OPERATIVE CHEMOTHERAPY;  Surgeon: Kathie Rhodes, MD;  Location: [place];  Service: Urology;  Laterality: N/A;  ONLY NEEDS 60 MIN  . TRANSURETHRAL RESECTION OF BLADDER TUMOR N/A 07/15/2019   Procedure: TRANSURETHRAL RESECTION OF BLADDER TUMOR (TURBT)/ CYSTOSCOPY;  Surgeon: Kathie Rhodes, MD;  Location: [place];  Service: Urology;  Laterality: N/A;  . TRANSURETHRAL RESECTION OF BLADDER TUMOR N/A 01/03/2020   Procedure: TRANSURETHRAL RESECTION OF BLADDER TUMOR (TURBT);  Surgeon: Alexis Frock, MD;  Location: [facility];  Service: Urology;  Laterality: N/A;  1 HR    Social History  reports that he quit smoking about 56 years ago. His smoking use included cigarettes. He has a 10.00 pack-year smoking history. He has never used smokeless tobacco. He reports current alcohol use. He reports that he does not use drugs.  No Known Allergies  Family History  Problem Relation Age of Onset  . Prostate cancer Father   . Bladder Cancer Brother        bladder/dx 30 years  ago     Prior to Admission medications   Medication Sig Start Date End Date Taking? Authorizing Provider  acetaminophen (TYLENOL) 325 MG tablet Take 650 mg by mouth every 6 (six) hours as needed for mild pain, moderate pain or headache.    [provider]  diazepam (VALIUM) 5 MG tablet Take 5 mg by mouth at bedtime as needed for anxiety.     [provider]  ibandronate (BONIVA) 150 MG tablet Take 150 mg by mouth every 30 (thirty) days. Take in the morning with a full glass of water, on an empty stomach, and do not take anything else by mouth or lie down for the next 30 min.    [provider]  Meth-Hyo-M Bl-Na Phos-Ph Sal (URIBEL) 118 MG CAPS Take 118 capsules by mouth.    [provider]  Meth-Hyo-M Bl-Na Phos-Ph Sal (URIBEL) 118 MG CAPS Take 1 capsule (118 mg total) by mouth every 8 (eight) hours as needed (for dysuria). 01/03/20   Alexis Frock, MD  omeprazole (PRILOSEC) 20 MG capsule Take 20 mg by mouth every evening.     [provider]  prochlorperazine (COMPAZINE) 10 MG tablet  Take 1 tablet (10 mg total) by mouth every 6 (six) hours as needed for nausea or vomiting. 02/06/20   Wyatt Portela, MD  silodosin (RAPAFLO) 8 MG CAPS capsule Take 8 mg by mouth every evening.    [provider]  simvastatin (ZOCOR) 10 MG tablet Take 10 mg by mouth every evening. 07/29/14   [provider]  traMADol (ULTRAM) 50 MG tablet Take 1 tablet (50 mg total) by mouth every 6 (six) hours as needed for moderate pain. Post-operatively 01/03/20 01/02/21  Alexis Frock, MD    Physical Exam: Vitals:   04/14/20 2200 04/14/20 2230 04/14/20 2300 04/15/20 0013  BP: (!) 124/97 121/65 115/69 132/75  Pulse: (!) 104 (!) 105 (!) 102 (!) 102  Resp: (!) 26 (!) 22 (!) 22 18  Temp: 98.42 F (36.9 C) 98.24 F (36.8 C) 98.06 F (36.7 C) 97.9 F (36.6 C)  TempSrc:    Oral  SpO2: (!) 89% 92% 92% 96%  Weight:      Height:        Constitutional: NAD, calm,  comfortable Vitals:   04/14/20 2200 04/14/20 2230 04/14/20 2300 04/15/20 0013  BP: (!) 124/97 121/65 115/69 132/75  Pulse: (!) 104 (!) 105 (!) 102 (!) 102  Resp: (!) 26 (!) 22 (!) 22 18  Temp: 98.42 F (36.9 C) 98.24 F (36.8 C) 98.06 F (36.7 C) 97.9 F (36.6 C)  TempSrc:    Oral  SpO2: (!) 89% 92% 92% 96%  Weight:      Height:       General: WDWN, Alert and oriented x3.  Eyes: EOMI, PERRL, conjunctivae normal.  Sclera nonicteric HENT:  [place]/AT, external ears normal. Nares patent without epistasis. Posterior pharynx clear of any exudate or lesions. Neck: Soft, normal range of motion, supple, no masses,  Trachea midline Respiratory: clear to auscultation bilaterally, no wheezing, no crackles. Normal respiratory effort. No accessory muscle use.  Cardiovascular: Regular rate and rhythm, no murmurs / rubs / gallops. No extremity edema. 1+ pedal pulses Abdomen: Soft, no tenderness, nondistended, no rebound or guarding.  No masses palpated. No hepatosplenomegaly. Bowel sounds normoactive Musculoskeletal: FROM. no clubbing / cyanosis. No joint deformity upper and lower extremities.  Normal muscle tone.  Skin: Warm, dry, intact no rashes, lesions, ulcers. No induration Neurologic: CN 2-12 grossly intact.  Normal speech.  Sensation intact, Strength 4/5 in all extremities.   Psychiatric: Normal judgment and insight.  Normal mood.    Labs on Admission: I have personally reviewed following labs and imaging studies  CBC: Recent Labs  Lab 04/14/20 1935  WBC 20.6*  NEUTROABS 18.4*  HGB 12.2*  HCT 35.4*  MCV 85.9  PLT 128*    Basic Metabolic Panel: Recent Labs  Lab 04/14/20 1935  NA 129*  K 5.7*  CL 98  CO2 11*  GLUCOSE 110*  BUN 119*  CREATININE 6.17*  CALCIUM 8.2*    GFR: Estimated Creatinine Clearance: 7.8 mL/min (A) (by C-G formula based on SCr of 6.17 mg/dL (H)).  Liver Function Tests: Recent Labs  Lab 04/14/20 1935  AST 45*  ALT 43  ALKPHOS 182*  BILITOT 1.0   PROT 7.6  ALBUMIN 2.8*    Urine analysis:    Component Value Date/Time   COLORURINE YELLOW 04/14/2020 2130   APPEARANCEUR CLOUDY (A) 04/14/2020 2130   LABSPEC 1.015 04/14/2020 2130   PHURINE 6.0 04/14/2020 2130   GLUCOSEU NEGATIVE 04/14/2020 2130   HGBUR LARGE (A) 04/14/2020 2130   BILIRUBINUR NEGATIVE 04/14/2020  2130   KETONESUR NEGATIVE 04/14/2020 2130   PROTEINUR >300 (A) 04/14/2020 2130   NITRITE NEGATIVE 04/14/2020 2130   LEUKOCYTESUR LARGE (A) 04/14/2020 2130    Radiological Exams on Admission: DG Chest Port 1 View  Result Date: 04/14/2020 CLINICAL DATA:  Generalized weakness with shortness of breath and cough x4 days. EXAM: PORTABLE CHEST 1 VIEW COMPARISON:  September 28, 2015 FINDINGS: Very mild atelectasis is seen within the left lung base. There is no evidence of acute infiltrate, pleural effusion or pneumothorax. The heart size and mediastinal contours are within normal limits. The visualized skeletal structures are unremarkable. IMPRESSION: Very mild left basilar atelectasis. Electronically Signed   By: Virgina Norfolk M.D.   On: 04/14/2020 19:45   CT Renal Stone Study  Result Date: 04/14/2020 CLINICAL DATA:  Flank pain. Generalized weakness and shortness of breath. EXAM: CT ABDOMEN AND PELVIS WITHOUT CONTRAST TECHNIQUE: Multidetector CT imaging of the abdomen and pelvis was performed following the standard protocol without IV contrast. COMPARISON:  November 08, 2018 FINDINGS: Lower chest: There is a trace right-sided pleural effusion with adjacent atelectasis.There appears to be an ascending thoracic aortic aneurysm measuring approximately 4.3 cm. This was previously described. The intracardiac blood pool is hypodense relative to the adjacent myocardium consistent with anemia. Hepatobiliary: The liver is normal. Normal gallbladder.There is no biliary ductal dilation. Pancreas: Normal contours without ductal dilatation. No peripancreatic fluid collection. Spleen: Unremarkable.  Adrenals/Urinary Tract: --Adrenal glands: Unremarkable. --Right kidney/ureter: There is severe right-sided hydronephrosis to the level of the patient's urinary bladder. There is no definite obstructing stone visualized in the distal right ureter. --Left kidney/ureter: The left kidney is absent. --Urinary bladder: There is bladder wall thickening with bladder wall calcifications as before. Stomach/Bowel: --Stomach/Duodenum: No hiatal hernia or other gastric abnormality. Normal duodenal course and caliber. --Small bowel: Unremarkable. --Colon: Unremarkable. --Appendix: Not visualized. No right lower quadrant inflammation or free fluid. Vascular/Lymphatic: Atherosclerotic calcification is present within the non-aneurysmal abdominal aorta, without hemodynamically significant stenosis. --No retroperitoneal lymphadenopathy. --No mesenteric lymphadenopathy. --No pelvic or inguinal lymphadenopathy. Reproductive: Unremarkable Other: No ascites or free air. There is a small amount of free fluid in the patient's abdomen and pelvis. There is a fat containing right inguinal hernia the contains a small volume of fluid. Musculoskeletal. No acute displaced fractures. IMPRESSION: 1. Severe right-sided hydronephrosis to the level of the patient's urinary bladder. There is no definite obstructing stone visualized in the distal right ureter. Findings may be secondary to an underlying mass or stricture. Urology consultation is recommended. 2. Irregular appearance of the urinary bladder as before. 3. Trace right-sided pleural effusion with adjacent atelectasis. 4. Ascending thoracic aortic aneurysm measuring up to 4.3 cm. 5. Small amount of free fluid in the patient's abdomen and pelvis. 6. Chronic findings as detailed above. Aortic Atherosclerosis (ICD10-I70.0). Electronically Signed   By: Constance Holster M.D.   On: 04/14/2020 20:46    EKG: Independently reviewed. EKG shows sinus tachycardia with no acute ST elevation or  depression. QTc is 411  Assessment/Plan Principal Problem:   ARF (acute renal failure)  History Pereida has acute renal failure with creatinine greater than six. He has severe right-sided hydronephrosis consistent with stenosis or blockage. Nephrology was consulted and recommended patient be placed on a sodium bicarb infusion which was started in the emergency room and is continued. Neurologist been consulted and will evaluate patient in the morning and patient may need to have further urological intervention and stent placement.  Check renal function with labs in morning  Active Problems:   Sepsis  Patient meets sepsis criteria with acute renal failure, urinary tract infection, pyelonephritis with fever, tachycardia tachypnea Empirically given IV fluids and antibiotics in the emergency room. Patient was given vancomycin and cefepime. Ultras were obtained and will be monitored. Antibiotics to be adjusted as indicated    Hyperkalemia Treated with Lokelma in the emergency room. Electrolytes will be checked in the morning    Pyelonephritis And placed on empiric antibiotics with cefepime and vancomycin    Hydronephrosis of right kidney Repeat ultrasound will be obtained in the morning to reevaluate hydronephrosis per urology recommendation    Hyponatremia IV fluids are being provided. Will monitor electrolytes repeat labs in the morning    Cancer of lateral wall of urinary bladder ([place]) Patient is currently on chemotherapy from oncology    DVT prophylaxis: Heparin for DVT prophylaxis. Padua score elevated Code Status:   Full code  Family Communication:  Diagnosis and plan discussed with patient. he verbalized understanding agrees with plan. Further recommendations to follow as clinical indicated Disposition Plan:   Patient is from:  Home via [place] ER  Anticipated DC to:  Home  Anticipated DC date:  Anticipate greater than two midnight stay in the hospital  Anticipated DC  barriers: No barriers to discharge identified at this time  Consults called:  Urology-Dr. Louis Meckel, nephrology Admission status:  Inpatient   Yevonne Aline Alizay Bronkema MD [facility]  How to contact the [facility] Attending or Consulting provider [place] or covering provider during after hours [place], for this patient?   1. Check the care team in [facility] and look for a) attending/consulting [facility] provider listed and b) the [facility] team listed 2. Log into [email or web address] and use [place]'s universal password to access. If you do not have the password, please contact the hospital operator. 3. Locate the [facility] provider you are looking for under [facility] and page to a number that you can be directly reached. 4. If you still have difficulty reaching the provider, please page the [facility] (Director on Call) for the Hospitalists listed on amion for assistance.  04/15/2020, 2:28 AM

## 2020-04-15 NOTE — Progress Notes (Signed)
Pharmacy Antibiotic Note  Douglas Wood is a 86 y.o. male admitted on 04/14/2020 with AKI/pyelonephritis  Pharmacy has been consulted for Vancomycin  dosing. Vancomycin 1 g IV given in ED at 2240    Plan: F/U renal function and redose as indicated  Height: 5' 9.5" (176.5 cm) Weight: 64.4 kg (142 lb) IBW/kg (Calculated) : 71.85  Temp (24hrs), Avg:98.2 F (36.8 C), Min:97.5 F (36.4 C), Max:99.2 F (37.3 C)  Recent Labs  Lab 04/14/20 1935 04/14/20 2245  WBC 20.6*  --   CREATININE 6.17*  --   LATICACIDVEN 2.4* 1.3    Estimated Creatinine Clearance: 7.8 mL/min (A) (by C-G formula based on SCr of 6.17 mg/dL (H)).    No Known Allergies  Antimicrobials this admission: Cefepime 12/21 >>  Vanc 12/21 >>   Phillis Knack, PharmD, BCPS

## 2020-04-16 ENCOUNTER — Inpatient Hospital Stay: Payer: Medicare Other

## 2020-04-16 ENCOUNTER — Inpatient Hospital Stay: Payer: Medicare Other | Admitting: Oncology

## 2020-04-16 LAB — CBC WITH DIFFERENTIAL/PLATELET
Abs Immature Granulocytes: 0.7 10*3/uL — ABNORMAL HIGH (ref 0.00–0.07)
Basophils Absolute: 0 10*3/uL (ref 0.0–0.1)
Basophils Relative: 0 %
Eosinophils Absolute: 0.1 10*3/uL (ref 0.0–0.5)
Eosinophils Relative: 0 %
HCT: 25.3 % — ABNORMAL LOW (ref 39.0–52.0)
Hemoglobin: 9.2 g/dL — ABNORMAL LOW (ref 13.0–17.0)
Immature Granulocytes: 3 %
Lymphocytes Relative: 2 %
Lymphs Abs: 0.5 10*3/uL — ABNORMAL LOW (ref 0.7–4.0)
MCH: 29.1 pg (ref 26.0–34.0)
MCHC: 36.4 g/dL — ABNORMAL HIGH (ref 30.0–36.0)
MCV: 80.1 fL (ref 80.0–100.0)
Monocytes Absolute: 0.7 10*3/uL (ref 0.1–1.0)
Monocytes Relative: 3 %
Neutro Abs: 21.6 10*3/uL — ABNORMAL HIGH (ref 1.7–7.7)
Neutrophils Relative %: 92 %
Platelets: 57 10*3/uL — ABNORMAL LOW (ref 150–400)
RBC: 3.16 MIL/uL — ABNORMAL LOW (ref 4.22–5.81)
RDW: 14.6 % (ref 11.5–15.5)
WBC: 23.6 10*3/uL — ABNORMAL HIGH (ref 4.0–10.5)
nRBC: 0.3 % — ABNORMAL HIGH (ref 0.0–0.2)

## 2020-04-16 LAB — COMPREHENSIVE METABOLIC PANEL
ALT: 27 U/L (ref 0–44)
AST: 27 U/L (ref 15–41)
Albumin: 1.6 g/dL — ABNORMAL LOW (ref 3.5–5.0)
Alkaline Phosphatase: 151 U/L — ABNORMAL HIGH (ref 38–126)
Anion gap: 20 — ABNORMAL HIGH (ref 5–15)
BUN: 142 mg/dL — ABNORMAL HIGH (ref 8–23)
CO2: 18 mmol/L — ABNORMAL LOW (ref 22–32)
Calcium: 7.3 mg/dL — ABNORMAL LOW (ref 8.9–10.3)
Chloride: 93 mmol/L — ABNORMAL LOW (ref 98–111)
Creatinine, Ser: 6.84 mg/dL — ABNORMAL HIGH (ref 0.61–1.24)
GFR, Estimated: 7 mL/min — ABNORMAL LOW (ref >60)
Glucose, Bld: 110 mg/dL — ABNORMAL HIGH (ref 70–99)
Potassium: 4.7 mmol/L (ref 3.5–5.1)
Sodium: 131 mmol/L — ABNORMAL LOW (ref 135–145)
Total Bilirubin: 1.1 mg/dL (ref 0.3–1.2)
Total Protein: 5.2 g/dL — ABNORMAL LOW (ref 6.5–8.1)

## 2020-04-16 LAB — MAGNESIUM: Magnesium: 2 mg/dL (ref 1.7–2.4)

## 2020-04-16 LAB — URINE CULTURE: Culture: >=100000 — AB

## 2020-04-16 MED ORDER — OXYCODONE-ACETAMINOPHEN 5-325 MG PO TABS: 1.0000 | ORAL_TABLET | ORAL | Status: DC | PRN

## 2020-04-16 MED ORDER — OXYCODONE-ACETAMINOPHEN 5-325 MG PO TABS
1.0000 | ORAL_TABLET | ORAL | Status: DC | PRN
  Administered 2020-04-16: 1 via ORAL
  Filled 2020-04-16: qty 1

## 2020-04-16 MED ORDER — OXYCODONE-ACETAMINOPHEN 5-325 MG PO TABS
1.0000 | ORAL_TABLET | ORAL | Status: DC | PRN
  Administered 2020-04-17: 1 via ORAL
  Filled 2020-04-16: qty 1

## 2020-04-16 MED ORDER — METOPROLOL TARTRATE 5 MG/5ML IV SOLN
2.5000 mg | Freq: Once | INTRAVENOUS | Status: AC
  Administered 2020-04-16: 01:00:00 2.5 mg via INTRAVENOUS
  Filled 2020-04-16: qty 5

## 2020-04-16 MED ORDER — TRAMADOL HCL 50 MG PO TABS
50.0000 mg | ORAL_TABLET | Freq: Four times a day (QID) | ORAL | Status: DC | PRN
Start: 2020-04-16 — End: 2020-04-16

## 2020-04-16 MED ORDER — TRAMADOL HCL 50 MG PO TABS
50.0000 mg | ORAL_TABLET | Freq: Two times a day (BID) | ORAL | Status: DC | PRN
Start: 2020-04-16 — End: 2020-04-17

## 2020-04-16 MED ORDER — SODIUM CHLORIDE 0.9 % IV SOLN
1.0000 g | INTRAVENOUS | Status: DC
  Administered 2020-04-16: 22:00:00 1 g via INTRAVENOUS
  Filled 2020-04-16 (×2): qty 1

## 2020-04-16 NOTE — Progress Notes (Signed)
Subjective:  At least 650 of UOP but BUN and crt rising however, repeat imaging ordered for today.  Is alert and responsive-  No nausea    Objective Vital signs in last 24 hours: Vitals:   04/15/20 2332 04/16/20 0453 04/16/20 0901 04/16/20 0936  BP:  105/65 113/82 (!) 104/58  Pulse: (!) 108 (!) 104  99  Resp:  20 20 18   Temp:  97.8 F (36.6 C) 97.7 F (36.5 C)   TempSrc:  Oral Oral   SpO2:  100% 93% 96%  Weight:      Height:       Weight change:   Intake/Output Summary (Last 24 hours) at 04/16/2020 0954 Last data filed at 04/16/2020 5462 Gross per 24 hour  Intake 1689.27 ml  Output 575 ml  Net 1114.27 ml    Assessment/ Plan: Pt is a 86 y.o. yo male with solitary kidney and bladder cancer who was admitted on 04/14/2020 with  AKI and urosepsis   Assessment/Plan: 1.  AKI due to obstructive uropathy- crt on October 21 1.19- then 1.8-1.9 last checked on 12/3 possibly related to bladder cancer but also cannot rule out ATN from hypotension.  Foley catheter placed without much improvement of renal function- has gross hematuria.  Renal US with improvement of right hydronephrosis. 1. No urgent indication for dialysis at this time but getting I am getting more uncomfortable with BUN 140's. He does not appear to be overly uremic.  Pt volunteers that he is a DNR-  Feels like he would want dialysis in the short term but not sure if it were something that was needing to continue 2. He is making some urine but need to make sure his hydro is responding to foley or will need PNT placement - repeat imaging is pending.  2. Hyperkalemia- treated with bicarb and lokelma.  improved 3. Pyelonephritis with sepsis.   Blood cultures + for E. Coli and Enterobacterales.  Vanco stopped and continued on rocephin. 4. Obstructive uropathy- in solitary functioning kidney.  Urology following and if no significant improvement may require percutaneous nephrostomy tube.  CT ordered for today 5. Hyponatremia-  presumably due to #1.  Follow with IVF's- improving  6. Anion gap metabolic acidosis- due to #1.  Currently on isotonic bicarb drip.  improving  7. Anemia of malignancy- follow H/H. 8. Bladder cancer- followed by Drs. Manny and [place].  On chemo but high grade lesion.  Consider palliative care consult to help set goals/limits of care.    Louis Meckel    Labs: Basic Metabolic Panel: Recent Labs  Lab 04/14/20 1935 04/15/20 0407 04/16/20 0134  NA 129* 129* 131*  K 5.7* 5.4* 4.7  CL 98 98 93*  CO2 11* 13* 18*  GLUCOSE 110* 120* 110*  BUN 119* 125* 142*  CREATININE 6.17* 6.37* 6.84*  CALCIUM 8.2* 7.5* 7.3*   Liver Function Tests: Recent Labs  Lab 04/14/20 1935 04/16/20 0134  AST 45* 27  ALT 43 27  ALKPHOS 182* 151*  BILITOT 1.0 1.1  PROT 7.6 5.2*  ALBUMIN 2.8* 1.6*   No results for input(s): LIPASE, AMYLASE in the last 168 hours. No results for input(s): AMMONIA in the last 168 hours. CBC: Recent Labs  Lab 04/14/20 1935 04/15/20 0407 04/16/20 0134  WBC 20.6* 21.6* 23.6*  NEUTROABS 18.4*  --  21.6*  HGB 12.2* 9.4* 9.2*  HCT 35.4* 26.9* 25.3*  MCV 85.9 83.8 80.1  PLT 128* 99* 57*   Cardiac Enzymes: No results for  input(s): CKTOTAL, CKMB, CKMBINDEX, TROPONINI in the last 168 hours. CBG: No results for input(s): GLUCAP in the last 168 hours.  Iron Studies: No results for input(s): IRON, TIBC, TRANSFERRIN, FERRITIN in the last 72 hours. Studies/Results: US RENAL  Result Date: 04/15/2020 CLINICAL DATA:  Acute renal failure EXAM: RENAL / URINARY TRACT ULTRASOUND COMPLETE COMPARISON:  Renal stone CT from yesterday FINDINGS: Right Kidney: Renal measurements: 15 x 7 x 7 cm = volume: 380 mL. Improved hydronephrosis. No evidence of mass or collection Left Kidney: Absent Bladder: Thick walled bladder which is collapsed around a Foley catheter. IMPRESSION: 1. Mild and improved hydronephrosis of the solitary right kidney after bladder drainage. 2. Thick walled  bladder. Electronically Signed   By: Marnee Spring M.D.   On: 04/15/2020 04:53   DG Chest Port 1 View  Result Date: 04/14/2020 CLINICAL DATA:  Generalized weakness with shortness of breath and cough x4 days. EXAM: PORTABLE CHEST 1 VIEW COMPARISON:  September 28, 2015 FINDINGS: Very mild atelectasis is seen within the left lung base. There is no evidence of acute infiltrate, pleural effusion or pneumothorax. The heart size and mediastinal contours are within normal limits. The visualized skeletal structures are unremarkable. IMPRESSION: Very mild left basilar atelectasis. Electronically Signed   By: Aram Candela M.D.   On: 04/14/2020 19:45   CT Renal Stone Study  Result Date: 04/14/2020 CLINICAL DATA:  Flank pain. Generalized weakness and shortness of breath. EXAM: CT ABDOMEN AND PELVIS WITHOUT CONTRAST TECHNIQUE: Multidetector CT imaging of the abdomen and pelvis was performed following the standard protocol without IV contrast. COMPARISON:  November 08, 2018 FINDINGS: Lower chest: There is a trace right-sided pleural effusion with adjacent atelectasis.There appears to be an ascending thoracic aortic aneurysm measuring approximately 4.3 cm. This was previously described. The intracardiac blood pool is hypodense relative to the adjacent myocardium consistent with anemia. Hepatobiliary: The liver is normal. Normal gallbladder.There is no biliary ductal dilation. Pancreas: Normal contours without ductal dilatation. No peripancreatic fluid collection. Spleen: Unremarkable. Adrenals/Urinary Tract: --Adrenal glands: Unremarkable. --Right kidney/ureter: There is severe right-sided hydronephrosis to the level of the patient's urinary bladder. There is no definite obstructing stone visualized in the distal right ureter. --Left kidney/ureter: The left kidney is absent. --Urinary bladder: There is bladder wall thickening with bladder wall calcifications as before. Stomach/Bowel: --Stomach/Duodenum: No hiatal hernia or  other gastric abnormality. Normal duodenal course and caliber. --Small bowel: Unremarkable. --Colon: Unremarkable. --Appendix: Not visualized. No right lower quadrant inflammation or free fluid. Vascular/Lymphatic: Atherosclerotic calcification is present within the non-aneurysmal abdominal aorta, without hemodynamically significant stenosis. --No retroperitoneal lymphadenopathy. --No mesenteric lymphadenopathy. --No pelvic or inguinal lymphadenopathy. Reproductive: Unremarkable Other: No ascites or free air. There is a small amount of free fluid in the patient's abdomen and pelvis. There is a fat containing right inguinal hernia the contains a small volume of fluid. Musculoskeletal. No acute displaced fractures. IMPRESSION: 1. Severe right-sided hydronephrosis to the level of the patient's urinary bladder. There is no definite obstructing stone visualized in the distal right ureter. Findings may be secondary to an underlying mass or stricture. Urology consultation is recommended. 2. Irregular appearance of the urinary bladder as before. 3. Trace right-sided pleural effusion with adjacent atelectasis. 4. Ascending thoracic aortic aneurysm measuring up to 4.3 cm. 5. Small amount of free fluid in the patient's abdomen and pelvis. 6. Chronic findings as detailed above. Aortic Atherosclerosis (ICD10-I70.0). Electronically Signed   By: Katherine Mantle M.D.   On: 04/14/2020 20:46   Medications: Infusions: .  sodium chloride 500 mL (04/14/20 2112)  . sodium chloride    . cefTRIAXone (ROCEPHIN)  IV 2 g (04/15/20 1756)  . sodium bicarbonate (isotonic) 150 mEq in D5W 1000 mL infusion 100 mL/hr at 04/14/20 2140    Scheduled Medications: . Chlorhexidine Gluconate Cloth  6 each Topical Daily  . sodium zirconium cyclosilicate  10 g Oral Daily    have reviewed scheduled and prn medications.  Physical Exam: General: alert, nad Heart: RRR- appears some tachy brady Lungs: mostly clear Abdomen: soft, non  tender Extremities: trace edema-  Foley with bright red urine and clots    04/16/2020,9:54 AM  LOS: 1 day

## 2020-04-16 NOTE — Progress Notes (Addendum)
PROGRESS NOTE    Douglas Wood  [id number] DOB: 10/26/1931 DOA: 04/14/2020 PCP: [name], MD   Brief Narrative:  Patient is 86 year old male with past medical history of bladder cancer-on chemotherapy.  His last chemo session was about 3 weeks ago.  Presented with complaint of generalized weakness since past few days.  Upon arrival to ED: Patient was found to have acute renal failure with creatinine of 6.17 elevated from baseline of 1.8 a month ago.  He was also found to have severe right-sided hydronephrosis with possible obstruction or stricture of the ureter at the bladder.  Nephrology and urology consulted.  Patient started on broad-spectrum antibiotics and admitted for further evaluation and management.  Assessment & Plan:   Severe sepsis in the setting of E. coli bacteremia and severe right-sided obstructive uropathy: -Patient presented with tachycardia, tachypnea, low blood pressure, elevated lactic acid of 2.4, leukocytosis in 20,000.  PCT: 32 -CT renal study shows hydronephrosis to the level of the patient's urinary bladder.  No definite obstructing stone.  Finding may be secondary to underlying mass or stricture. -Blood culture 4 out of 4 shows E. coli no resistance.  Continue IV Rocephin -Catheter-blood-tinged urine noted -repeat renal ultrasound shows mild improvement in hydronephrosis. -Appreciate urology's recommendations. -Consult palliative care to discuss goals of care -Monitor vitals and H&H closely  Severe AKI: -In the setting of obstructive uropathy -Renal function slightly declined this morning.  Monitor kidney function closely -Continue IV fluids.  Hold nephrotoxic medication. -Nephrology is on board-appreciate assistance.  Recommended no urgent indication of dialysis at this time. -Repeat BMP tomorrow a.m.  High anion gap metabolic acidosis: -In the setting of AKI. -Continue sodium bicarb infusion as per nephrology  recommendations  Hyperkalemia: -Resolved  Hyponatremia: Sodium 129 improved to 131 -Continue IV fluids -Repeat BMP tomorrow a.m.  Cancer of lateral wall of urinary bladder: -Currently on chemotherapy.  Last chemo was about 3 weeks ago.  Followed by oncology outpatient  Thrombocytopenia: Platelet: 99 trended down to 57 -Repeat CBC tomorrow a.m.  Overall poor prognosis.  Patient's white count trended up from 20,000-23,000.  His kidney function trending down.    Had a nice conversation with patient and his daughter at the bedside.  Patient does not want aggressive measures at this time.  He is okay with IV antibiotics and fluids.  He agreed with home with hospice care once he is stable for the discharge.  DVT prophylaxis: DC heparin-in the setting of hematuria and low platelet count Code Status: Full code Family Communication: None present at bedside.  Plan of care discussed with patient in length and he verbalized understanding and agreed with it.  Disposition Plan: To be determined  Consultants:   Urology  Nephrology  Procedures:  CT renal study Renal ultrasound Antimicrobials:  Cefepime Vancomycin Rocephin\  Status is: Inpatient   Dispo: The patient is from: Home              Anticipated d/c is to: Home              Anticipated d/c date is: 2 days              Patient currently is not medically stable to d/c.    Subjective: Patient seen and examined.  Resting comfortably on the bed.  No new complaints.  Tells me that he is hungry and would like to have breakfast.  Remained afebrile.  Denies pain or any discomfort knees.  Objective: Vitals:   04/15/20 2332  04/16/20 0453 04/16/20 0901 04/16/20 0936  BP:  105/65 113/82 (!) 104/58  Pulse: (!) 108 (!) 104  99  Resp:  20 20 18   Temp:  97.8 F (36.6 C) 97.7 F (36.5 C)   TempSrc:  Oral Oral   SpO2:  100% 93% 96%  Weight:      Height:        Intake/Output Summary (Last 24 hours) at 04/16/2020 1141 Last data  filed at 04/16/2020 [id number] Gross per 24 hour  Intake 1689.27 ml  Output 575 ml  Net 1114.27 ml   Filed Weights   04/14/20 1915  Weight: 64.4 kg    Examination:  General exam: Appears calm and comfortable, thin and lean, on room air, communicating well Respiratory system: Clear to auscultation. Respiratory effort normal. Cardiovascular system: S1 & S2 heard, RRR. No JVD, murmurs, rubs, gallops or clicks. No pedal edema. Gastrointestinal system: Abdomen is nondistended, soft and nontender. No organomegaly or masses felt. Normal bowel sounds heard.  Has Foley in with blood-tinged urine Central nervous system: Alert and oriented. No focal neurological deficits. Extremities: Symmetric 5 x 5 power. Skin: No rashes, lesions or ulcers Psychiatry: Judgement and insight appear normal. Mood & affect appropriate.    Data Reviewed: I have personally reviewed following labs and imaging studies  CBC: Recent Labs  Lab 04/14/20 1935 04/15/20 0407 04/16/20 0134  WBC 20.6* 21.6* 23.6*  NEUTROABS 18.4*  --  21.6*  HGB 12.2* 9.4* 9.2*  HCT 35.4* 26.9* 25.3*  MCV 85.9 83.8 80.1  PLT 128* 99* 57*   Basic Metabolic Panel: Recent Labs  Lab 04/14/20 1935 04/15/20 0407 04/16/20 0134  NA 129* 129* 131*  K 5.7* 5.4* 4.7  CL 98 98 93*  CO2 11* 13* 18*  GLUCOSE 110* 120* 110*  BUN 119* 125* 142*  CREATININE 6.17* 6.37* 6.84*  CALCIUM 8.2* 7.5* 7.3*  MG  --   --  2.0   GFR: Estimated Creatinine Clearance: 7.1 mL/min (A) (by C-G formula based on SCr of 6.84 mg/dL (H)). Liver Function Tests: Recent Labs  Lab 04/14/20 1935 04/16/20 0134  AST 45* 27  ALT 43 27  ALKPHOS 182* 151*  BILITOT 1.0 1.1  PROT 7.6 5.2*  ALBUMIN 2.8* 1.6*   No results for input(s): LIPASE, AMYLASE in the last 168 hours. No results for input(s): AMMONIA in the last 168 hours. Coagulation Profile: Recent Labs  Lab 04/15/20 0407  INR 1.3*   Cardiac Enzymes: No results for input(s): CKTOTAL, CKMB, CKMBINDEX,  TROPONINI in the last 168 hours. BNP (last 3 results) No results for input(s): PROBNP in the last 8760 hours. HbA1C: No results for input(s): HGBA1C in the last 72 hours. CBG: No results for input(s): GLUCAP in the last 168 hours. Lipid Profile: No results for input(s): CHOL, HDL, LDLCALC, TRIG, CHOLHDL, LDLDIRECT in the last 72 hours. Thyroid Function Tests: No results for input(s): TSH, T4TOTAL, FREET4, T3FREE, THYROIDAB in the last 72 hours. Anemia Panel: No results for input(s): VITAMINB12, FOLATE, FERRITIN, TIBC, IRON, RETICCTPCT in the last 72 hours. Sepsis Labs: Recent Labs  Lab 04/14/20 1935 04/14/20 2245 04/15/20 0407  PROCALCITON  --   --  32.02  LATICACIDVEN 2.4* 1.3  --     Recent Results (from the past 240 hour(s))  Blood culture (routine x 2)     Status: Abnormal (Preliminary result)   Collection Time: 04/14/20  7:34 PM   Specimen: BLOOD  Result Value Ref Range Status   Specimen Description  Final    BLOOD BLOOD LEFT FOREARM Performed at [facility], [address] [place], [place] [address]    Special Requests   Final    BOTTLES DRAWN AEROBIC AND ANAEROBIC Blood Culture adequate volume Performed at [facility], [address] [place], [place] [address]    Culture  Setup Time   Final    GRAM NEGATIVE RODS IN BOTH AEROBIC AND ANAEROBIC BOTTLES Organism ID to follow CRITICAL RESULT CALLED TO, READ BACK BY AND VERIFIED WITH: EMILY SINCLAIR PHARMD @1024  [id number] Performed at [facility], [address] [place], [place] [address]    Culture ESCHERICHIA COLI (A)  Final   Report Status PENDING  Incomplete  Blood Culture ID Panel (Reflexed)     Status: Abnormal   Collection Time: 04/14/20  7:34 PM  Result Value Ref Range Status   Enterococcus faecalis NOT DETECTED NOT DETECTED Final   Enterococcus Faecium NOT DETECTED NOT DETECTED Final   Listeria monocytogenes NOT DETECTED NOT DETECTED Final   Staphylococcus species NOT  DETECTED NOT DETECTED Final   Staphylococcus aureus (BCID) NOT DETECTED NOT DETECTED Final   Staphylococcus epidermidis NOT DETECTED NOT DETECTED Final   Staphylococcus lugdunensis NOT DETECTED NOT DETECTED Final   Streptococcus species NOT DETECTED NOT DETECTED Final   Streptococcus agalactiae NOT DETECTED NOT DETECTED Final   Streptococcus pneumoniae NOT DETECTED NOT DETECTED Final   Streptococcus pyogenes NOT DETECTED NOT DETECTED Final   A.calcoaceticus-baumannii NOT DETECTED NOT DETECTED Final   Bacteroides fragilis NOT DETECTED NOT DETECTED Final   Enterobacterales DETECTED (A) NOT DETECTED Final    Comment: Enterobacterales represent a large order of gram negative bacteria, not a single organism. CRITICAL RESULT CALLED TO, READ BACK BY AND VERIFIED WITH: EMILY SINCLAIR PHARMD @1024  04/15/20 EB    Enterobacter cloacae complex NOT DETECTED NOT DETECTED Final   Escherichia coli DETECTED (A) NOT DETECTED Final    Comment: CRITICAL RESULT CALLED TO, READ BACK BY AND VERIFIED WITH: EMILY SINCLAIR PHARMD @1024  04/15/20 EB    Klebsiella aerogenes NOT DETECTED NOT DETECTED Final   Klebsiella oxytoca NOT DETECTED NOT DETECTED Final   Klebsiella pneumoniae NOT DETECTED NOT DETECTED Final   Proteus species NOT DETECTED NOT DETECTED Final   Salmonella species NOT DETECTED NOT DETECTED Final   Serratia marcescens NOT DETECTED NOT DETECTED Final   Haemophilus influenzae NOT DETECTED NOT DETECTED Final   Neisseria meningitidis NOT DETECTED NOT DETECTED Final   Pseudomonas aeruginosa NOT DETECTED NOT DETECTED Final   Stenotrophomonas maltophilia NOT DETECTED NOT DETECTED Final   Candida albicans NOT DETECTED NOT DETECTED Final   Candida auris NOT DETECTED NOT DETECTED Final   Candida glabrata NOT DETECTED NOT DETECTED Final   Candida krusei NOT DETECTED NOT DETECTED Final   Candida parapsilosis NOT DETECTED NOT DETECTED Final   Candida tropicalis NOT DETECTED NOT DETECTED Final    Cryptococcus neoformans/gattii NOT DETECTED NOT DETECTED Final   CTX-M ESBL NOT DETECTED NOT DETECTED Final   Carbapenem resistance IMP NOT DETECTED NOT DETECTED Final   Carbapenem resistance KPC NOT DETECTED NOT DETECTED Final   Carbapenem resistance NDM NOT DETECTED NOT DETECTED Final   Carbapenem resist OXA 48 LIKE NOT DETECTED NOT DETECTED Final   Carbapenem resistance VIM NOT DETECTED NOT DETECTED Final    Comment: Performed at [facility], [address], [place], [place] [address]  Resp Panel by RT-PCR (Flu A&B, Covid) Nasopharyngeal Swab     Status:  None   Collection Time: 04/14/20  7:35 PM   Specimen: Nasopharyngeal Swab; Nasopharyngeal(NP) swabs in vial transport medium  Result Value Ref Range Status   SARS Coronavirus 2 by RT PCR NEGATIVE NEGATIVE Final    Comment: (NOTE) SARS-CoV-2 target nucleic acids are NOT DETECTED.  The SARS-CoV-2 RNA is generally detectable in upper respiratory specimens during the acute phase of infection. The lowest concentration of SARS-CoV-2 viral copies this assay can detect is 138 copies/mL. A negative result does not preclude SARS-Cov-2 infection and should not be used as the sole basis for treatment or other patient management decisions. A negative result may occur with  improper specimen collection/handling, submission of specimen other than nasopharyngeal swab, presence of viral mutation(s) within the areas targeted by this assay, and inadequate number of viral copies(<138 copies/mL). A negative result must be combined with clinical observations, patient history, and epidemiological information. The expected result is Negative.  Fact Sheet for Patients:  EntrepreneurPulse.com.au  Fact Sheet for Healthcare Providers:  IncredibleEmployment.be  This test is no t yet approved or cleared by the [place] FDA and  has been authorized for detection and/or diagnosis of SARS-CoV-2 by FDA under an  Emergency Use Authorization (EUA). This EUA will remain  in effect (meaning this test can be used) for the duration of the COVID-19 declaration under Section 564(b)(1) of the Act, 21 U.S.C.section 360bbb-3(b)(1), unless the authorization is terminated  or revoked sooner.       Influenza A by PCR NEGATIVE NEGATIVE Final   Influenza B by PCR NEGATIVE NEGATIVE Final    Comment: (NOTE) The Xpert Xpress SARS-CoV-2/FLU/RSV plus assay is intended as an aid in the diagnosis of influenza from Nasopharyngeal swab specimens and should not be used as a sole basis for treatment. Nasal washings and aspirates are unacceptable for Xpert Xpress SARS-CoV-2/FLU/RSV testing.  Fact Sheet for Patients: EntrepreneurPulse.com.au  Fact Sheet for Healthcare Providers: IncredibleEmployment.be  This test is not yet approved or cleared by the [place] FDA and has been authorized for detection and/or diagnosis of SARS-CoV-2 by FDA under an Emergency Use Authorization (EUA). This EUA will remain in effect (meaning this test can be used) for the duration of the COVID-19 declaration under Section 564(b)(1) of the Act, 21 U.S.C. section 360bbb-3(b)(1), unless the authorization is terminated or revoked.  Performed at [facility], [address] [place], [place] [address]   Blood culture (routine x 2)     Status: Abnormal (Preliminary result)   Collection Time: 04/14/20  8:00 PM   Specimen: BLOOD  Result Value Ref Range Status   Specimen Description   Final    BLOOD RIGHT ANTECUBITAL Performed at [facility], [address] [place], [place] [address]    Special Requests   Final    BOTTLES DRAWN AEROBIC AND ANAEROBIC Blood Culture adequate volume Performed at [facility], [address] [place], [place] [address]    Culture  Setup Time   Final    GRAM NEGATIVE RODS IN BOTH AEROBIC AND ANAEROBIC BOTTLES CRITICAL VALUE NOTED.   VALUE IS CONSISTENT WITH PREVIOUSLY REPORTED AND CALLED VALUE. Performed at [facility], [place] [address], [place], [place] [address]    Culture ESCHERICHIA COLI (A)  Final   Report Status PENDING  Incomplete  Urine Culture     Status: Abnormal (Preliminary result)   Collection Time: 04/14/20  9:30 PM   Specimen: Urine, Random  Result Value Ref Range Status  Specimen Description   Final    URINE, RANDOM Performed at [facility], [address] [place], [place] [address]    Special Requests   Final    NONE Performed at [facility], [address] [place], [place] [address]    Culture (A)  Final    >=100,000 COLONIES/mL GRAM NEGATIVE RODS IDENTIFICATION AND SUSCEPTIBILITIES TO FOLLOW Performed at [facility], [place] [address] [place], [place] [address]    Report Status PENDING  Incomplete      Radiology Studies: US RENAL  Result Date: 04/15/2020 CLINICAL DATA:  Acute renal failure EXAM: RENAL / URINARY TRACT ULTRASOUND COMPLETE COMPARISON:  Renal stone CT from yesterday FINDINGS: Right Kidney: Renal measurements: 15 x 7 x 7 cm = volume: 380 mL. Improved hydronephrosis. No evidence of mass or collection Left Kidney: Absent Bladder: Thick walled bladder which is collapsed around a Foley catheter. IMPRESSION: 1. Mild and improved hydronephrosis of the solitary right kidney after bladder drainage. 2. Thick walled bladder. Electronically Signed   By: Monte Fantasia M.D.   On: 04/15/2020 04:53   DG Chest Port 1 View  Result Date: 04/14/2020 CLINICAL DATA:  Generalized weakness with shortness of breath and cough x4 days. EXAM: PORTABLE CHEST 1 VIEW COMPARISON:  September 28, 2015 FINDINGS: Very mild atelectasis is seen within the left lung base. There is no evidence of acute infiltrate, pleural effusion or pneumothorax. The heart size and mediastinal contours are within normal limits. The visualized skeletal structures are unremarkable. IMPRESSION: Very mild  left basilar atelectasis. Electronically Signed   By: Virgina Norfolk M.D.   On: 04/14/2020 19:45   CT Renal Stone Study  Result Date: 04/14/2020 CLINICAL DATA:  Flank pain. Generalized weakness and shortness of breath. EXAM: CT ABDOMEN AND PELVIS WITHOUT CONTRAST TECHNIQUE: Multidetector CT imaging of the abdomen and pelvis was performed following the standard protocol without IV contrast. COMPARISON:  November 08, 2018 FINDINGS: Lower chest: There is a trace right-sided pleural effusion with adjacent atelectasis.There appears to be an ascending thoracic aortic aneurysm measuring approximately 4.3 cm. This was previously described. The intracardiac blood pool is hypodense relative to the adjacent myocardium consistent with anemia. Hepatobiliary: The liver is normal. Normal gallbladder.There is no biliary ductal dilation. Pancreas: Normal contours without ductal dilatation. No peripancreatic fluid collection. Spleen: Unremarkable. Adrenals/Urinary Tract: --Adrenal glands: Unremarkable. --Right kidney/ureter: There is severe right-sided hydronephrosis to the level of the patient's urinary bladder. There is no definite obstructing stone visualized in the distal right ureter. --Left kidney/ureter: The left kidney is absent. --Urinary bladder: There is bladder wall thickening with bladder wall calcifications as before. Stomach/Bowel: --Stomach/Duodenum: No hiatal hernia or other gastric abnormality. Normal duodenal course and caliber. --Small bowel: Unremarkable. --Colon: Unremarkable. --Appendix: Not visualized. No right lower quadrant inflammation or free fluid. Vascular/Lymphatic: Atherosclerotic calcification is present within the non-aneurysmal abdominal aorta, without hemodynamically significant stenosis. --No retroperitoneal lymphadenopathy. --No mesenteric lymphadenopathy. --No pelvic or inguinal lymphadenopathy. Reproductive: Unremarkable Other: No ascites or free air. There is a small amount of free fluid  in the patient's abdomen and pelvis. There is a fat containing right inguinal hernia the contains a small volume of fluid. Musculoskeletal. No acute displaced fractures. IMPRESSION: 1. Severe right-sided hydronephrosis to the level of the patient's urinary bladder. There is no definite obstructing stone visualized in the distal right ureter. Findings may be secondary to an underlying mass or stricture. Urology consultation is recommended. 2. Irregular appearance of the urinary bladder  as before. 3. Trace right-sided pleural effusion with adjacent atelectasis. 4. Ascending thoracic aortic aneurysm measuring up to 4.3 cm. 5. Small amount of free fluid in the patient's abdomen and pelvis. 6. Chronic findings as detailed above. Aortic Atherosclerosis (ICD10-I70.0). Electronically Signed   By: Constance Holster M.D.   On: 04/14/2020 20:46    Scheduled Meds: . Chlorhexidine Gluconate Cloth  6 each Topical Daily  . sodium zirconium cyclosilicate  10 g Oral Daily   Continuous Infusions: . sodium chloride 500 mL (04/14/20 2112)  . sodium chloride    . cefTRIAXone (ROCEPHIN)  IV 2 g (04/15/20 1756)  . sodium bicarbonate (isotonic) 150 mEq in D5W 1000 mL infusion 100 mL/hr at 04/14/20 2140     LOS: 1 day   Time spent: 35 minutes   Granite Godman Loann Quill, MD [facility]  If 7PM-7AM, please contact night-coverage [email or web address] 04/16/2020, 11:41 AM

## 2020-04-16 NOTE — Progress Notes (Signed)
Pt has been on D3 of abx (cefepime>ceftriaxone) for his e.coli bacteremia. ESBL was not detected on BCID but culture came back with ESBL. D/w Dr. Doristine Bosworth, we will change ceftriaxone to Merrem.   Merrem 1g IV q24 (AKI)  Onnie Boer, PharmD, BCIDP, AAHIVP, CPP Infectious Disease Pharmacist 04/16/2020 6:05 PM

## 2020-04-16 NOTE — Progress Notes (Signed)
Subjective/Chief Complaint:   1- Muscle Invasive, Recurrent Bladder Cancer - s/p chemo-XRT 2019 for T2 bladder cancer under care Dr. Karsten Ro.   Recent abbreviated Course:  06/2019 - T1G3, CT localized ==> induciton BCG x 6; 12/2019 - massive T1G3 recurrence all quandrants of bladder and new pendulous urethral tumor ==> start palliative Keytruda  03/2020 - cysto improved urethral disease, come small volume bladder recurrence , Cr 1.9 by cancer center labs.   2 - Lower Urinary Tract Symptoms - s/p prior TURP. On rapaflo 8mg  at baseline.   3 - Radiation Cystitis- significnat radiaton changes with intermitant hematuria by cysto x seeral. Uses URibel PRN as well.   4 - Solitary Right Kidney / Renal Insufficiency / Malignant Hydronephrosis - solitary kidney on imaging x many. Cr <1 2021, now 1.9 early 03/2020. Worsening to Cr 6 's late 03/2020 with progressive Rt hydro to bladder, not sig changed with foley most c/w malignant obstruciton.   5 - Cancer Pain - on percocet PRN Q8 for progressive cancer pain.   6 - E. Coli Bacteremai - new e. Coli bacteremia during hospital stay 03/2020. [place] e. Coli / pending, placed on empiric rocephin. Rt malignant hydro as per above. Now CX data from our office to guide therapy.   7 - End of Life Care - pt with progressive incurable bladder cancer, now causing renal failure and severe infection. His functional status has declined significantly in past several months. We have discussed goals of care over several visits past few months and he is ready for hospice transition. He does not desire any further cancer-directed care / surgery / invasive procedures (nephrostomy).   Today " Douglas Wood " is seen in f/u above. No real GFR improvement with catheter. Overall picutre c/w progressive malitnant obstruction. He remains with SIRS response with tachycardia. He wants hospice transition.    Objective: Vital signs in last 24 hours: Temp:  [96.9 F (36.1 C)-98.1 F (36.7  C)] 97.7 F (36.5 C) (12/23 0901) Pulse Rate:  [100-108] 104 (12/23 0453) Resp:  [16-21] 20 (12/23 0901) BP: (105-135)/(65-93) 113/82 (12/23 0901) SpO2:  [93 %-100 %] 93 % (12/23 0901) Last BM Date:  (PTA)  Intake/Output from previous day: 12/22 0701 - 12/23 0700 In: 1689.3 [I.V.:1667.7; IV Piggyback:21.6] Out: 650 [Urine:650] Intake/Output this shift: Total I/O In: -  Out: 175 [Urine:175]  General appearance: alert, cooperative and much more frail, very pleasant as always.  Eyes: negative, uses glasses Nose: Nares normal. Septum midline. Mucosa normal. No drainage or sinus tenderness. Throat: lips, mucosa, and tongue normal; teeth and gums normal Back: symmetric, no curvature. ROM normal. No CVA tenderness. Resp: non-labored on RA Cardio: Tachy 110s by monitor GI: soft, non-tender; bowel sounds normal; no masses,  no organomegaly Male genitalia: normal, foley in place with tea colored urien, no clots.  Extremities: extremities normal, atraumatic, no cyanosis or edema Pulses: 2+ and symmetric Skin: Skin color, texture, turgor normal. No rashes or lesions Lymph nodes: Cervical, supraclavicular, and axillary nodes normal. Neurologic: Grossly normal  Lab Results:  Recent Labs    04/15/20 0407 04/16/20 0134  WBC 21.6* 23.6*  HGB 9.4* 9.2*  HCT 26.9* 25.3*  PLT 99* 57*   BMET Recent Labs    04/15/20 0407 04/16/20 0134  NA 129* 131*  K 5.4* 4.7  CL 98 93*  CO2 13* 18*  GLUCOSE 120* 110*  BUN 125* 142*  CREATININE 6.37* 6.84*  CALCIUM 7.5* 7.3*   PT/INR Recent Labs  04/15/20 0407  LABPROT 16.0*  INR 1.3*   ABG No results for input(s): PHART, HCO3 in the last 72 hours.  Invalid input(s): PCO2, PO2  Studies/Results: US RENAL  Result Date: 04/15/2020 CLINICAL DATA:  Acute renal failure EXAM: RENAL / URINARY TRACT ULTRASOUND COMPLETE COMPARISON:  Renal stone CT from yesterday FINDINGS: Right Kidney: Renal measurements: 15 x 7 x 7 cm = volume: 380 mL.  Improved hydronephrosis. No evidence of mass or collection Left Kidney: Absent Bladder: Thick walled bladder which is collapsed around a Foley catheter. IMPRESSION: 1. Mild and improved hydronephrosis of the solitary right kidney after bladder drainage. 2. Thick walled bladder. Electronically Signed   By: Monte Fantasia M.D.   On: 04/15/2020 04:53   DG Chest Port 1 View  Result Date: 04/14/2020 CLINICAL DATA:  Generalized weakness with shortness of breath and cough x4 days. EXAM: PORTABLE CHEST 1 VIEW COMPARISON:  September 28, 2015 FINDINGS: Very mild atelectasis is seen within the left lung base. There is no evidence of acute infiltrate, pleural effusion or pneumothorax. The heart size and mediastinal contours are within normal limits. The visualized skeletal structures are unremarkable. IMPRESSION: Very mild left basilar atelectasis. Electronically Signed   By: Virgina Norfolk M.D.   On: 04/14/2020 19:45   CT Renal Stone Study  Result Date: 04/14/2020 CLINICAL DATA:  Flank pain. Generalized weakness and shortness of breath. EXAM: CT ABDOMEN AND PELVIS WITHOUT CONTRAST TECHNIQUE: Multidetector CT imaging of the abdomen and pelvis was performed following the standard protocol without IV contrast. COMPARISON:  November 08, 2018 FINDINGS: Lower chest: There is a trace right-sided pleural effusion with adjacent atelectasis.There appears to be an ascending thoracic aortic aneurysm measuring approximately 4.3 cm. This was previously described. The intracardiac blood pool is hypodense relative to the adjacent myocardium consistent with anemia. Hepatobiliary: The liver is normal. Normal gallbladder.There is no biliary ductal dilation. Pancreas: Normal contours without ductal dilatation. No peripancreatic fluid collection. Spleen: Unremarkable. Adrenals/Urinary Tract: --Adrenal glands: Unremarkable. --Right kidney/ureter: There is severe right-sided hydronephrosis to the level of the patient's urinary bladder. There is  no definite obstructing stone visualized in the distal right ureter. --Left kidney/ureter: The left kidney is absent. --Urinary bladder: There is bladder wall thickening with bladder wall calcifications as before. Stomach/Bowel: --Stomach/Duodenum: No hiatal hernia or other gastric abnormality. Normal duodenal course and caliber. --Small bowel: Unremarkable. --Colon: Unremarkable. --Appendix: Not visualized. No right lower quadrant inflammation or free fluid. Vascular/Lymphatic: Atherosclerotic calcification is present within the non-aneurysmal abdominal aorta, without hemodynamically significant stenosis. --No retroperitoneal lymphadenopathy. --No mesenteric lymphadenopathy. --No pelvic or inguinal lymphadenopathy. Reproductive: Unremarkable Other: No ascites or free air. There is a small amount of free fluid in the patient's abdomen and pelvis. There is a fat containing right inguinal hernia the contains a small volume of fluid. Musculoskeletal. No acute displaced fractures. IMPRESSION: 1. Severe right-sided hydronephrosis to the level of the patient's urinary bladder. There is no definite obstructing stone visualized in the distal right ureter. Findings may be secondary to an underlying mass or stricture. Urology consultation is recommended. 2. Irregular appearance of the urinary bladder as before. 3. Trace right-sided pleural effusion with adjacent atelectasis. 4. Ascending thoracic aortic aneurysm measuring up to 4.3 cm. 5. Small amount of free fluid in the patient's abdomen and pelvis. 6. Chronic findings as detailed above. Aortic Atherosclerosis (ICD10-I70.0). Electronically Signed   By: Constance Holster M.D.   On: 04/14/2020 20:46    Anti-infectives: Anti-infectives (From admission, onward)   Start  Dose/Rate Route Frequency Ordered Stop   04/15/20 1800  cefTRIAXone (ROCEPHIN) 2 g in sodium chloride 0.9 % 100 mL IVPB        2 g 200 mL/hr over 30 Minutes Intravenous Every 24 hours 04/15/20 1037      04/15/20 0234  vancomycin variable dose per unstable renal function (pharmacist dosing)  Status:  Discontinued         Does not apply See admin instructions 04/15/20 0234 04/15/20 1037   04/14/20 2133  sodium chloride 0.9 % with ceFEPIme (MAXIPIME) ADS Med       Note to Pharmacy: Arlana Pouch   : cabinet override      04/14/20 2133 04/15/20 0944   04/14/20 2100  ceFEPIme (MAXIPIME) 1 g in sodium chloride 0.9 % 100 mL IVPB  Status:  Discontinued        1 g 200 mL/hr over 30 Minutes Intravenous Every 24 hours 04/14/20 2039 04/15/20 1037   04/14/20 2045  vancomycin (VANCOCIN) IVPB 1000 mg/200 mL premix        1,000 mg 200 mL/hr over 60 Minutes Intravenous  Once 04/14/20 2034 04/14/20 2340      Assessment/Plan:  Discussed frankly that overall picture is sequela of incurable progressive cancer and management options of aggressive path (Rt neph tube, (UO previously unable to identify on cysto x several), continued aggressive ABX) v. Palliative transition.  He opts for palliative transition. He would like to continue  Basic measures such as IVF and ABX, but no additional tubes, chemo, surgery. His goals are to get home with home hospice if possible. His wife has advanced dementia and does not meaningfully understand the situation. His wife and son are next of kin and involved in decision making. Discussed with daughter Douglas Wood who is in agreement and will be attending goals of care meeting later this afternoon.   Please call me directly with questions anytime.     Alexis Frock 04/16/2020

## 2020-04-16 NOTE — Progress Notes (Signed)
Page sent to [facility], MD regarding patients sustained HR in the 110's-130's, A Flutter. EKG obtained.  Will continue to monitor.

## 2020-04-17 DIAGNOSIS — A4151 Sepsis due to Escherichia coli [E. coli]: Principal | ICD-10-CM

## 2020-04-17 DIAGNOSIS — Z515 Encounter for palliative care: Secondary | ICD-10-CM

## 2020-04-17 DIAGNOSIS — N12 Tubulo-interstitial nephritis, not specified as acute or chronic: Secondary | ICD-10-CM

## 2020-04-17 DIAGNOSIS — R6520 Severe sepsis without septic shock: Secondary | ICD-10-CM

## 2020-04-17 DIAGNOSIS — Z7189 Other specified counseling: Secondary | ICD-10-CM

## 2020-04-17 LAB — CULTURE, BLOOD (ROUTINE X 2)
Special Requests: ADEQUATE
Special Requests: ADEQUATE

## 2020-04-17 LAB — CBC
HCT: 26.9 % — ABNORMAL LOW (ref 39.0–52.0)
Hemoglobin: 9.9 g/dL — ABNORMAL LOW (ref 13.0–17.0)
MCH: 29.3 pg (ref 26.0–34.0)
MCHC: 36.8 g/dL — ABNORMAL HIGH (ref 30.0–36.0)
MCV: 79.6 fL — ABNORMAL LOW (ref 80.0–100.0)
Platelets: 51 10*3/uL — ABNORMAL LOW (ref 150–400)
RBC: 3.38 MIL/uL — ABNORMAL LOW (ref 4.22–5.81)
RDW: 14.5 % (ref 11.5–15.5)
WBC: 19.6 10*3/uL — ABNORMAL HIGH (ref 4.0–10.5)
nRBC: 0.2 % (ref 0.0–0.2)

## 2020-04-17 LAB — RENAL FUNCTION PANEL
Albumin: 1.5 g/dL — ABNORMAL LOW (ref 3.5–5.0)
Anion gap: 22 — ABNORMAL HIGH (ref 5–15)
BUN: 158 mg/dL — ABNORMAL HIGH (ref 8–23)
CO2: 25 mmol/L (ref 22–32)
Calcium: 7.1 mg/dL — ABNORMAL LOW (ref 8.9–10.3)
Chloride: 87 mmol/L — ABNORMAL LOW (ref 98–111)
Creatinine, Ser: 7.58 mg/dL — ABNORMAL HIGH (ref 0.61–1.24)
GFR, Estimated: 6 mL/min — ABNORMAL LOW (ref >60)
Glucose, Bld: 92 mg/dL (ref 70–99)
Phosphorus: 9 mg/dL — ABNORMAL HIGH (ref 2.5–4.6)
Potassium: 4.7 mmol/L (ref 3.5–5.1)
Sodium: 134 mmol/L — ABNORMAL LOW (ref 135–145)

## 2020-04-17 MED ORDER — HYDROMORPHONE HCL 1 MG/ML IJ SOLN: 0.5000 mg | INTRAMUSCULAR | Status: DC | PRN

## 2020-04-17 MED ORDER — LEVOFLOXACIN 500 MG PO TABS: 500.0000 mg | ORAL_TABLET | ORAL | 0 refills | Status: AC

## 2020-04-17 MED ORDER — BISACODYL 10 MG RE SUPP: 10.0000 mg | Freq: Every day | RECTAL | Status: DC | PRN

## 2020-04-17 MED ORDER — LEVOFLOXACIN 750 MG PO TABS: 750.0000 mg | ORAL_TABLET | Freq: Once | ORAL | 0 refills | Status: AC

## 2020-04-17 MED ORDER — GLYCOPYRROLATE 0.2 MG/ML IJ SOLN: 0.4000 mg | INTRAMUSCULAR | Status: DC | PRN

## 2020-04-17 MED ORDER — GUAIFENESIN-CODEINE 100-10 MG/5ML PO SOLN
5.0000 mL | ORAL | Status: DC | PRN
  Administered 2020-04-17: 13:00:00 5 mL via ORAL
  Filled 2020-04-17: qty 5

## 2020-04-17 MED ORDER — OXYCODONE-ACETAMINOPHEN 5-325 MG PO TABS: 1.0000 | ORAL_TABLET | ORAL | 0 refills | Status: AC | PRN

## 2020-04-17 MED ORDER — LORAZEPAM 2 MG/ML IJ SOLN
0.5000 mg | INTRAMUSCULAR | Status: DC | PRN
Start: 2020-04-17 — End: 2020-04-17

## 2020-04-17 NOTE — Plan of Care (Signed)
  Problem: Health Behavior/Discharge Planning: Goal: Ability to manage health-related needs will improve Outcome: Not Progressing   

## 2020-04-17 NOTE — Progress Notes (Signed)
PROGRESS NOTE    Douglas Wood  UJW:[id number] DOB: 02-28-1932 DOA: 04/14/2020 PCP: Douglas Raider, MD   Brief Narrative:  Patient is 86 year old male with past medical history of bladder cancer-on chemotherapy.  His last chemo session was about 3 weeks ago.  Presented with complaint of generalized weakness since past few days.  Upon arrival to ED: Patient was found to have acute renal failure with creatinine of 6.17 elevated from baseline of 1.8 a month ago.  He was also found to have severe right-sided hydronephrosis with possible obstruction or stricture of the ureter at the bladder.  Nephrology and urology consulted.  Patient started on broad-spectrum antibiotics and admitted for further evaluation and management.  Assessment & Plan:   Severe sepsis in the setting of ESBL UTI and severe right-sided obstructive uropathy: -Patient presented with tachycardia, tachypnea, low blood pressure, elevated lactic acid of 2.4, leukocytosis in 20,000.  PCT: 32 -CT renal study shows hydronephrosis to the level of the patient's urinary bladder.  No definite obstructing stone.  Finding may be secondary to underlying mass or stricture. -Blood culture: ESBL. Changed IV Rocephin to Merem -Catheter-blood-tinged urine noted -repeat renal ultrasound shows mild improvement in hydronephrosis. -Appreciate urology's recommendations-Not a good candidate for further invasive intervention -Monitor vitals and H&H closely  Severe AKI: -In the setting of obstructive uropathy /hydronephrosis, solitary right kidney -No improvement in renal function-patient decided hospice care-Nephrology signed off -Continue IV fluids.  Hold nephrotoxic medication. -Repeat BMP tomorrow a.m.  High anion gap metabolic acidosis: -In the setting of AKI. -Continue sodium bicarb infusion as per nephrology recommendations  Hyperkalemia: -Resolved  Hyponatremia: Sodium 129 improved to 131-134 -Continue IV fluids -Repeat BMP  tomorrow a.m.  Cancer of lateral wall of urinary bladder: -Currently on chemotherapy.  Last chemo was about 3 weeks ago.  Followed by oncology outpatient  Thrombocytopenia: Platelet: 99 trended down to 57-51 -Repeat CBC tomorrow a.m.  Overall poor prognosis in the setting of progressive bladder cancer.  Worsening kidney function-Not a good candidate for nephrostomy tube/dialysis & patient wished to go with hopice care. Appreciate palliative care help.   DVT prophylaxis: DC heparin-in the setting of hematuria and low platelet count Code Status: Full code Family Communication: None present at bedside.  Plan of care discussed with patient in length and he verbalized understanding and agreed with it.  Disposition Plan: To be determined  Consultants:   Urology  Nephrology  Procedures:  CT renal study Renal ultrasound Antimicrobials:  Cefepime Vancomycin Rocephin\  Status is: Inpatient   Dispo: The patient is from: Home              Anticipated d/c is to: Home              Anticipated d/c date is: 2 days              Patient currently is not medically stable to d/c.    Subjective: Pt seen & examined-No new complaints. Remained afebrile.  Objective: Vitals:   04/16/20 1947 04/16/20 2350 04/17/20 0439 04/17/20 0827  BP: 117/69 112/76 139/79 139/85  Pulse: 92 93 99 96  Resp: 16 17 18 19   Temp: (!) 97.4 F (36.3 C) (!) 97.3 F (36.3 C) 97.8 F (36.6 C) 97.6 F (36.4 C)  TempSrc: Oral Oral Oral Oral  SpO2: 96% 98% 98% 97%  Weight:      Height:        Intake/Output Summary (Last 24 hours) at 04/17/2020 1049 Last data filed at  04/17/2020 0446 Gross per 24 hour  Intake 600 ml  Output 250 ml  Net 350 ml   Filed Weights   04/14/20 1915  Weight: 64.4 kg    Examination:  General exam: Appears calm and comfortable, thin and lean, on room air, communicating well, appears weak & sick Respiratory system: Clear to auscultation. Respiratory effort  normal. Cardiovascular system: S1 & S2 heard, RRR. No JVD, murmurs, rubs, gallops or clicks. No pedal edema. Gastrointestinal system: Abdomen is nondistended, soft and nontender. No organomegaly or masses felt. Normal bowel sounds heard.  Has Foley in with blood-tinged urine Central nervous system: Alert and oriented. No focal neurological deficits. Extremities: Symmetric 5 x 5 power. Skin: No rashes, lesions or ulcers Psychiatry: Judgement and insight appear normal. Mood & affect appropriate.    Data Reviewed: I have personally reviewed following labs and imaging studies  CBC: Recent Labs  Lab 04/14/20 1935 04/15/20 0407 04/16/20 0134 04/17/20 0218  WBC 20.6* 21.6* 23.6* 19.6*  NEUTROABS 18.4*  --  21.6*  --   HGB 12.2* 9.4* 9.2* 9.9*  HCT 35.4* 26.9* 25.3* 26.9*  MCV 85.9 83.8 80.1 79.6*  PLT 128* 99* 57* 51*   Basic Metabolic Panel: Recent Labs  Lab 04/14/20 1935 04/15/20 0407 04/16/20 0134 04/17/20 0218  NA 129* 129* 131* 134*  K 5.7* 5.4* 4.7 4.7  CL 98 98 93* 87*  CO2 11* 13* 18* 25  GLUCOSE 110* 120* 110* 92  BUN 119* 125* 142* 158*  CREATININE 6.17* 6.37* 6.84* 7.58*  CALCIUM 8.2* 7.5* 7.3* 7.1*  MG  --   --  2.0  --   PHOS  --   --   --  9.0*   GFR: Estimated Creatinine Clearance: 6.4 mL/min (A) (by C-G formula based on SCr of 7.58 mg/dL (H)). Liver Function Tests: Recent Labs  Lab 04/14/20 1935 04/16/20 0134 04/17/20 0218  AST 45* 27  --   ALT 43 27  --   ALKPHOS 182* 151*  --   BILITOT 1.0 1.1  --   PROT 7.6 5.2*  --   ALBUMIN 2.8* 1.6* 1.5*   No results for input(s): LIPASE, AMYLASE in the last 168 hours. No results for input(s): AMMONIA in the last 168 hours. Coagulation Profile: Recent Labs  Lab 04/15/20 0407  INR 1.3*   Cardiac Enzymes: No results for input(s): CKTOTAL, CKMB, CKMBINDEX, TROPONINI in the last 168 hours. BNP (last 3 results) No results for input(s): PROBNP in the last 8760 hours. HbA1C: No results for input(s):  HGBA1C in the last 72 hours. CBG: No results for input(s): GLUCAP in the last 168 hours. Lipid Profile: No results for input(s): CHOL, HDL, LDLCALC, TRIG, CHOLHDL, LDLDIRECT in the last 72 hours. Thyroid Function Tests: No results for input(s): TSH, T4TOTAL, FREET4, T3FREE, THYROIDAB in the last 72 hours. Anemia Panel: No results for input(s): VITAMINB12, FOLATE, FERRITIN, TIBC, IRON, RETICCTPCT in the last 72 hours. Sepsis Labs: Recent Labs  Lab 04/14/20 1935 04/14/20 2245 04/15/20 0407  PROCALCITON  --   --  32.02  LATICACIDVEN 2.4* 1.3  --     Recent Results (from the past 240 hour(s))  Blood culture (routine x 2)     Status: Abnormal   Collection Time: 04/14/20  7:34 PM   Specimen: BLOOD  Result Value Ref Range Status   Specimen Description   Final    BLOOD BLOOD LEFT FOREARM Performed at [facility], [address], [place], [place] [address]  Special Requests   Final    BOTTLES DRAWN AEROBIC AND ANAEROBIC Blood Culture adequate volume Performed at [facility], [address] [place], [place] [address]    Culture  Setup Time   Final    GRAM NEGATIVE RODS IN BOTH AEROBIC AND ANAEROBIC BOTTLES Organism ID to follow CRITICAL RESULT CALLED TO, READ BACK BY AND VERIFIED WITH: EMILY SINCLAIR PHARMD @1024  04/15/20 EB Performed at [facility], [address] [place], [place] [address]    Culture ESCHERICHIA COLI (A)  Final   Report Status 04/17/2020 FINAL  Final   Organism ID, Bacteria ESCHERICHIA COLI  Final      Susceptibility   Escherichia coli - MIC*    AMPICILLIN >=32 RESISTANT Resistant     CEFAZOLIN 16 SENSITIVE Sensitive     CEFEPIME <=0.12 SENSITIVE Sensitive     CEFTAZIDIME <=1 SENSITIVE Sensitive     CEFTRIAXONE <=0.25 SENSITIVE Sensitive     CIPROFLOXACIN <=0.25 SENSITIVE Sensitive     GENTAMICIN <=1 SENSITIVE Sensitive     IMIPENEM <=0.25 SENSITIVE Sensitive     TRIMETH/SULFA <=20 SENSITIVE Sensitive      AMPICILLIN/SULBACTAM >=32 RESISTANT Resistant     PIP/TAZO <=4 SENSITIVE Sensitive     * ESCHERICHIA COLI  Blood Culture ID Panel (Reflexed)     Status: Abnormal   Collection Time: 04/14/20  7:34 PM  Result Value Ref Range Status   Enterococcus faecalis NOT DETECTED NOT DETECTED Final   Enterococcus Faecium NOT DETECTED NOT DETECTED Final   Listeria monocytogenes NOT DETECTED NOT DETECTED Final   Staphylococcus species NOT DETECTED NOT DETECTED Final   Staphylococcus aureus (BCID) NOT DETECTED NOT DETECTED Final   Staphylococcus epidermidis NOT DETECTED NOT DETECTED Final   Staphylococcus lugdunensis NOT DETECTED NOT DETECTED Final   Streptococcus species NOT DETECTED NOT DETECTED Final   Streptococcus agalactiae NOT DETECTED NOT DETECTED Final   Streptococcus pneumoniae NOT DETECTED NOT DETECTED Final   Streptococcus pyogenes NOT DETECTED NOT DETECTED Final   A.calcoaceticus-baumannii NOT DETECTED NOT DETECTED Final   Bacteroides fragilis NOT DETECTED NOT DETECTED Final   Enterobacterales DETECTED (A) NOT DETECTED Final    Comment: Enterobacterales represent a large order of gram negative bacteria, not a single organism. CRITICAL RESULT CALLED TO, READ BACK BY AND VERIFIED WITH: EMILY SINCLAIR PHARMD @1024  04/15/20 EB    Enterobacter cloacae complex NOT DETECTED NOT DETECTED Final   Escherichia coli DETECTED (A) NOT DETECTED Final    Comment: CRITICAL RESULT CALLED TO, READ BACK BY AND VERIFIED WITH: EMILY SINCLAIR PHARMD @1024  04/15/20 EB    Klebsiella aerogenes NOT DETECTED NOT DETECTED Final   Klebsiella oxytoca NOT DETECTED NOT DETECTED Final   Klebsiella pneumoniae NOT DETECTED NOT DETECTED Final   Proteus species NOT DETECTED NOT DETECTED Final   Salmonella species NOT DETECTED NOT DETECTED Final   Serratia marcescens NOT DETECTED NOT DETECTED Final   Haemophilus influenzae NOT DETECTED NOT DETECTED Final   Neisseria meningitidis NOT DETECTED NOT DETECTED Final    Pseudomonas aeruginosa NOT DETECTED NOT DETECTED Final   Stenotrophomonas maltophilia NOT DETECTED NOT DETECTED Final   Candida albicans NOT DETECTED NOT DETECTED Final   Candida auris NOT DETECTED NOT DETECTED Final   Candida glabrata NOT DETECTED NOT DETECTED Final   Candida krusei NOT DETECTED NOT DETECTED Final   Candida parapsilosis NOT DETECTED NOT DETECTED Final   Candida tropicalis NOT DETECTED NOT DETECTED Final   Cryptococcus neoformans/gattii NOT DETECTED NOT DETECTED Final  CTX-M ESBL NOT DETECTED NOT DETECTED Final   Carbapenem resistance IMP NOT DETECTED NOT DETECTED Final   Carbapenem resistance KPC NOT DETECTED NOT DETECTED Final   Carbapenem resistance NDM NOT DETECTED NOT DETECTED Final   Carbapenem resist OXA 48 LIKE NOT DETECTED NOT DETECTED Final   Carbapenem resistance VIM NOT DETECTED NOT DETECTED Final    Comment: Performed at [facility], [place] [address], [place], [place] [address]  Resp Panel by RT-PCR (Flu A&B, Covid) Nasopharyngeal Swab     Status: None   Collection Time: 04/14/20  7:35 PM   Specimen: Nasopharyngeal Swab; Nasopharyngeal(NP) swabs in vial transport medium  Result Value Ref Range Status   SARS Coronavirus 2 by RT PCR NEGATIVE NEGATIVE Final    Comment: (NOTE) SARS-CoV-2 target nucleic acids are NOT DETECTED.  The SARS-CoV-2 RNA is generally detectable in upper respiratory specimens during the acute phase of infection. The lowest concentration of SARS-CoV-2 viral copies this assay can detect is 138 copies/mL. A negative result does not preclude SARS-Cov-2 infection and should not be used as the sole basis for treatment or other patient management decisions. A negative result may occur with  improper specimen collection/handling, submission of specimen other than nasopharyngeal swab, presence of viral mutation(s) within the areas targeted by this assay, and inadequate number of viral copies(<138 copies/mL). A negative result must be  combined with clinical observations, patient history, and epidemiological information. The expected result is Negative.  Fact Sheet for Patients:  EntrepreneurPulse.com.au  Fact Sheet for Healthcare Providers:  IncredibleEmployment.be  This test is no t yet approved or cleared by the [place] FDA and  has been authorized for detection and/or diagnosis of SARS-CoV-2 by FDA under an Emergency Use Authorization (EUA). This EUA will remain  in effect (meaning this test can be used) for the duration of the COVID-19 declaration under Section 564(b)(1) of the Act, 21 U.S.C.section 360bbb-3(b)(1), unless the authorization is terminated  or revoked sooner.       Influenza A by PCR NEGATIVE NEGATIVE Final   Influenza B by PCR NEGATIVE NEGATIVE Final    Comment: (NOTE) The Xpert Xpress SARS-CoV-2/FLU/RSV plus assay is intended as an aid in the diagnosis of influenza from Nasopharyngeal swab specimens and should not be used as a sole basis for treatment. Nasal washings and aspirates are unacceptable for Xpert Xpress SARS-CoV-2/FLU/RSV testing.  Fact Sheet for Patients: EntrepreneurPulse.com.au  Fact Sheet for Healthcare Providers: IncredibleEmployment.be  This test is not yet approved or cleared by the [place] FDA and has been authorized for detection and/or diagnosis of SARS-CoV-2 by FDA under an Emergency Use Authorization (EUA). This EUA will remain in effect (meaning this test can be used) for the duration of the COVID-19 declaration under Section 564(b)(1) of the Act, 21 U.S.C. section 360bbb-3(b)(1), unless the authorization is terminated or revoked.  Performed at [facility], [address] [place], [place] [address]   Blood culture (routine x 2)     Status: Abnormal   Collection Time: 04/14/20  8:00 PM   Specimen: BLOOD  Result Value Ref Range Status   Specimen Description    Final    BLOOD RIGHT ANTECUBITAL Performed at [facility], [address] [place], [place] [address]    Special Requests   Final    BOTTLES DRAWN AEROBIC AND ANAEROBIC Blood Culture adequate volume Performed at [facility], [address] [place], [place] [address]    Culture  Setup Time   Final    GRAM NEGATIVE RODS IN BOTH AEROBIC AND ANAEROBIC BOTTLES CRITICAL VALUE NOTED.  VALUE IS CONSISTENT WITH PREVIOUSLY REPORTED AND CALLED VALUE.    Culture (A)  Final    ESCHERICHIA COLI SUSCEPTIBILITIES PERFORMED ON PREVIOUS CULTURE WITHIN THE LAST 5 DAYS. Performed at [facility], [place] [address], [place], [place] [address]    Report Status 04/17/2020 FINAL  Final  Urine Culture     Status: Abnormal   Collection Time: 04/14/20  9:30 PM   Specimen: Urine, Random  Result Value Ref Range Status   Specimen Description   Final    URINE, RANDOM Performed at [facility], [address] [place], [place] [address]    Special Requests   Final    NONE Performed at [facility], [address] [place], [place] [address]    Culture (A)  Final    >=100,000 COLONIES/mL ESCHERICHIA COLI Confirmed Extended Spectrum Beta-Lactamase Producer (ESBL).  In bloodstream infections from ESBL organisms, carbapenems are preferred over piperacillin/tazobactam. They are shown to have a lower risk of mortality.    Report Status 04/16/2020 FINAL  Final   Organism ID, Bacteria ESCHERICHIA COLI (A)  Final      Susceptibility   Escherichia coli - MIC*    AMPICILLIN >=32 RESISTANT Resistant     CEFAZOLIN RESISTANT Resistant     CEFEPIME 4 INTERMEDIATE Intermediate     CEFTRIAXONE 8 RESISTANT Resistant     CIPROFLOXACIN <=0.25 SENSITIVE Sensitive     GENTAMICIN <=1 SENSITIVE Sensitive     IMIPENEM <=0.25 SENSITIVE Sensitive     NITROFURANTOIN <=16 SENSITIVE Sensitive     TRIMETH/SULFA <=20 SENSITIVE Sensitive     AMPICILLIN/SULBACTAM >=32 RESISTANT  Resistant     PIP/TAZO <=4 SENSITIVE Sensitive     * >=100,000 COLONIES/mL ESCHERICHIA COLI      Radiology Studies: No results found.  Scheduled Meds: . Chlorhexidine Gluconate Cloth  6 each Topical Daily  . sodium zirconium cyclosilicate  10 g Oral Daily   Continuous Infusions: . sodium chloride 500 mL (04/14/20 2112)  . sodium chloride    . meropenem (MERREM) IV 1 g (04/16/20 2158)  . sodium bicarbonate (isotonic) 150 mEq in D5W 1000 mL infusion 100 mL/hr at 04/16/20 2347     LOS: 2 days   Time spent: 35 minutes   Obie Silos Loann Quill, MD [facility]  If 7PM-7AM, please contact night-coverage [email or web address] 04/17/2020, 10:49 AM

## 2020-04-17 NOTE — Consult Note (Addendum)
Palliative Medicine Inpatient Consult Note  Reason for consult:  Goals of Care  HPI:  Per intake H&P --> Patient is 86 year old male with past medical history of bladder cancer-on chemotherapy.  His last chemo session was about 3 weeks ago.  Presented with complaint of generalized weakness since past few days. Was admitted for urosepsis - has a h/o recurrent bladder cancer which is deemed incurable from the perspective of urology.  Palliative care was asked to get involved to aid in goals of care conversations.   Clinical Assessment/Goals of Care: I have reviewed medical records including EPIC notes, labs and imaging, received report from bedside RN, assessed the patient who was lying in bed.    I met with Rosita Fire, Emmaline Kluver. Pauline Aus, and [place] to further discuss diagnosis prognosis, [place], EOL wishes, disposition and options.   I introduced Palliative Medicine as specialized medical care for people living with serious illness. It focuses on providing relief from the symptoms and stress of a serious illness. The goal is to improve quality of life for both the patient and the family.  Dugan was born and raised in [place]. He was married once and has three children, Joslyn Devon, Raelyn Mora, and Juliane Lack. 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 He was someone who enjoyed being active. He very much liked playing golf. He is a man of faith and practices Christianity.   Prior to hospitalization Amiel was mobile and able to provide self care.  A detailed discussion was had today regarding advanced directives - there are none in [place] though Mackey makes decisions with his three children. His daughter, Joslyn Devon would be his surrogate decision maker if he were unable .    Concepts specific to code status, artifical feeding and hydration, continued IV antibiotics and rehospitalization was had. Patient is a DNAR/DNI.  The difference between a aggressive  medical intervention path  and a palliative comfort care path for this patient at this time was had. We talked about transition to comfort measures in house and what that would entail inclusive of medications to control pain, dyspnea, agitation, nausea, itching, and hiccups.  We discussed stopping all uneccessary measures such as blood draws, needle sticks, and frequent vital signs. 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  He expresses that he does not want to be a burden to himself or to anyone else.  We discussed trying to get [place] home on Hospice care. I described hospice as a service for patients for have a life expectancy of < 6 months. It preserves dignity and quality at the end phases of life. The focus changes from curative to symptom relief.   Beth expresses having lived a very fulfilling life. He shares that he is ready for the end of his journey and only asks that he is not in pain. We discussed the goal of managing his pain more comprehensively/agressively from her on out. We discussed that given his recent kidney injury he is likely looking at very limited time.   Discussed the importance of continued conversation with family and their  medical providers regarding overall plan of care and treatment options, ensuring decisions are within the context of the patients values and GOCs.  Decision Maker:  SUMMARY OF RECOMMENDATIONS   DNAR/DNI  DNR Form on Front of Chart  Transition focus to comfort - Medication  per [facility]  Dilaudid for pain  Added guaifenesin-codeine for cough PRN  TOC - Expedite transition home  Code Status/Advance Care Planning: DNAR/DNI   Palliative Prophylaxis:   Oral Care, Mobility   Additional Recommendations (Limitations, Scope, Preferences):  Comfort focused care  Psycho-social/Spiritual:   Desire for further Chaplaincy support: Yes -  Christian  Additional Recommendations: Education on end of life care   Prognosis: Poor in the setting of incurable bladder CA.   Discharge Planning: Discharge to home with hospice.  Vitals:   04/16/20 2350 04/17/20 0439  BP: 112/76 139/79  Pulse: 93 99  Resp: 17 18  Temp: (!) 97.3 F (36.3 C) 97.8 F (36.6 C)  SpO2: 98% 98%    Intake/Output Summary (Last 24 hours) at 04/17/2020 2194 Last data filed at 04/17/2020 0446 Gross per 24 hour  Intake 600 ml  Output 425 ml  Net 175 ml   Last Weight  Most recent update: 04/14/2020  7:15 PM   Weight  64.4 kg (142 lb)           Gen:  NAD HEENT: moist mucous membranes CV: Regular rate and rhythm, no murmurs rubs or gallops PULM: clear to auscultation bilaterally ABD: soft/nontender  GU: Foley in place - draining maroon colored urine EXT: No edema  Neuro: Alert and oriented x4  PPS: 20%   This conversation/these recommendations were discussed with patient primary care team, Dr. Doristine Bosworth  Time In: 1030 Time Out: 1140 Total Time: 70 Greater than 50%  of this time was spent counseling and coordinating care related to the above assessment and plan.  [place] Team Team Cell Phone: [phone] Please utilize secure chat with additional questions, if there is no response within 30 minutes please call the above phone number  Palliative Medicine Team providers are available by phone from 7am to 7pm daily and can be reached through the team cell phone.  Should this patient require assistance outside of these hours, please call the patient's attending physician.

## 2020-04-17 NOTE — Progress Notes (Signed)
Subjective:  At least 425 of UOP but BUN and crt rising.  Note from urology seen-  Pt not interested in anything more invasive-  Wants to transition to full palliative care  Objective Vital signs in last 24 hours: Vitals:   04/16/20 1947 04/16/20 2350 04/17/20 0439 04/17/20 0827  BP: 117/69 112/76 139/79 139/85  Pulse: 92 93 99 96  Resp: 16 17 18 19   Temp: (!) 97.4 F (36.3 C) (!) 97.3 F (36.3 C) 97.8 F (36.6 C) 97.6 F (36.4 C)  TempSrc: Oral Oral Oral Oral  SpO2: 96% 98% 98% 97%  Weight:      Height:       Weight change:   Intake/Output Summary (Last 24 hours) at 04/17/2020 0934 Last data filed at 04/17/2020 0446 Gross per 24 hour  Intake 600 ml  Output 250 ml  Net 350 ml    Assessment/ Plan: Pt is a 86 y.o. yo male with solitary kidney and bladder cancer who was admitted on 04/14/2020 with  AKI and urosepsis   Assessment/Plan: 1.  AKI due to obstructive uropathy- crt on October 21 1.19- then 1.8-1.9 last checked on 12/3 possibly related to bladder cancer but also cannot rule out ATN from hypotension.  Foley catheter placed without much improvement of renal function- has gross hematuria.  initial Renal US with improvement of right hydronephrosis. 1. Unfortunately renal function is not improved-  Pt has now decided that he is opting out of further invasive care-  Is wanting to transition to full hospice.  If renal function does not turn around suspect his life expectancy will be days. Renal service will sign off, call with any questions   2. Hyperkalemia- treated with bicarb and lokelma.  improved 3. Pyelonephritis with sepsis.   Blood cultures + for E. Coli and Enterobacterales.  Vanco stopped and continued on rocephin. 4. Obstructive uropathy- in solitary functioning kidney.  Urology following and if no significant improvement may require percutaneous nephrostomy tube.  CT ordered for today 5. Hyponatremia- presumably due to #1.  Follow with IVF's- improving     6. Bladder cancer- followed by Drs. Manny and [place].  On chemo but high grade lesion.  Now going to transition to full comfort care       [place]: Basic Metabolic Panel: Recent Labs  Lab 04/15/20 0407 04/16/20 0134 04/17/20 0218  NA 129* 131* 134*  K 5.4* 4.7 4.7  CL 98 93* 87*  CO2 13* 18* 25  GLUCOSE 120* 110* 92  BUN 125* 142* 158*  CREATININE 6.37* 6.84* 7.58*  CALCIUM 7.5* 7.3* 7.1*  PHOS  --   --  9.0*   Liver Function Tests: Recent Labs  Lab 04/14/20 1935 04/16/20 0134 04/17/20 0218  AST 45* 27  --   ALT 43 27  --   ALKPHOS 182* 151*  --   BILITOT 1.0 1.1  --   PROT 7.6 5.2*  --   ALBUMIN 2.8* 1.6* 1.5*   No results for input(s): LIPASE, AMYLASE in the last 168 hours. No results for input(s): AMMONIA in the last 168 hours. CBC: Recent Labs  Lab 04/14/20 1935 04/15/20 0407 04/16/20 0134 04/17/20 0218  WBC 20.6* 21.6* 23.6* 19.6*  NEUTROABS 18.4*  --  21.6*  --   HGB 12.2* 9.4* 9.2* 9.9*  HCT 35.4* 26.9* 25.3* 26.9*  MCV 85.9 83.8 80.1 79.6*  PLT 128* 99* 57* 51*   Cardiac Enzymes: No results for input(s): CKTOTAL, CKMB,  CKMBINDEX, TROPONINI in the last 168 hours. CBG: No results for input(s): GLUCAP in the last 168 hours.  Iron Studies: No results for input(s): IRON, TIBC, TRANSFERRIN, FERRITIN in the last 72 hours. Studies/Results: No results found. Medications: Infusions: . sodium chloride 500 mL (04/14/20 2112)  . sodium chloride    . meropenem (MERREM) IV 1 g (04/16/20 2158)  . sodium bicarbonate (isotonic) 150 mEq in D5W 1000 mL infusion 100 mL/hr at 04/16/20 2347    Scheduled Medications: . Chlorhexidine Gluconate Cloth  6 each Topical Daily  . sodium zirconium cyclosilicate  10 g Oral Daily    have reviewed scheduled and prn medications.  Physical Exam: General: alert, nad Heart: RRR- appears some tachy brady Lungs: mostly clear Abdomen: soft, non tender Extremities: trace edema-  Foley with  bright red urine and clots    04/17/2020,9:34 AM  LOS: 2 days

## 2020-04-17 NOTE — TOC Transition Note (Signed)
Transition of Care (TOC) - CM/SW Discharge Note Marvetta Gibbons RN, BSN Transitions of Care Unit 4E- RN Case Manager See Treatment Team for direct phone #    Patient Details  Name: Douglas Wood MRN: [id number] Date of Birth: 08-03-1931  Transition of Care [facility]) CM/SW Contact:  Dawayne Patricia, RN Phone Number: 04/17/2020, 2:18 PM   Clinical Narrative:    Referral received for home hospice needs, CM spoke with pt and family at the bedside- confirmed plans to return home with hospice services- Choice offered with list Per CMS guidelines from medicare.gov website with star ratings (copy placed in shadow chart)- per family they are familiar with [place] and [facility]- their main goal is to get pt home as soon as possible- and with that in mind don't really have a preference other than one that can get patient home the quickest.  Discussed DME needs- family requesting hospital bed- but state that this does not have to be in place before pt returns home, also discussed transportation home- family prefers EMS transport home- address confirmed in [place].  Call made [place] for home hospice referral- Chrislyn will f/u with family and check to see if they can admit pt into hospice 1300- confirmed Hospice services with [facility]- they will plan to see pt tomorrow in the home- pt can discharge today with family. MD notified for d/c orders. GOLD DNR signed on chart. [facility] arranging DME for home.   1430- [facility] has been called for transport home, paperwork placed on chart along with GOLD DNR.    Final next level of care: Home w Hospice Care Barriers to Discharge: No Barriers Identified   Patient Goals and CMS Choice Patient states their goals for this hospitalization and ongoing recovery are:: return home with comfort care CMS Medicare.gov Compare Post Acute Care list provided to:: Patient Choice offered to / list presented to : [place]  Discharge Placement                  Home with Hospice,       Discharge Plan and Services   Discharge Planning Services: CM Consult Post Acute Care Choice: Hospice          DME Arranged: Hospice Equipment Package Others DME Agency: [employer] Date DME Agency Contacted: 04/17/20 Time DME Agency Contacted:  (per [facility])     [place]: Hospice and [place] Date [place]: 04/17/20 Time [place]: 1220 Representative spoke with at [place]: [place] ([place]) Interventions     Readmission Risk Interventions Readmission Risk Prevention Plan 04/17/2020  Transportation Screening Complete  PCP or Specialist Appt within 5-7 Days Complete  Home Care Screening Complete  Medication Review (RN CM) Complete  Some recent data might be hidden

## 2020-04-17 NOTE — Progress Notes (Signed)
EPIC Template:  New Hospice at Home Referral Note  [facility])  Received request from [facility] for hospice services at home after discharge.  Chart and pt information under review by [facility] physician.  Hospice eligibility pending at this time.  Hospital liaison spoke with Joslyn Devon, patient's daughter, to initiate education related to hospice philosophy and services and to answer any questions at this time. Joslyn Devon verbalized understanding of information given.  Per discussion the plan is to discharge home today by [facility].  Admission visit tentatively scheduled for tomorrow afternoon.    [place] send signed and completed DNR home with pt/family.  Please provide prescriptions at discharge as needed to ensure ongoing symptom management.   DME needs discussed.  DME NOT needed prior to discharge.  The following DME will be ordered with delivery requested for today if possible: bed, Over bed table, transport chair, bedside commode.  Address has been verified and is correct in the chart.  [facility] information and contact numbers given to Joslyn Devon.  Above information shared with [place] Manager.  Please call with any questions or concerns.  Thank you for the opportunity to participate in this pt's care.  Domenic Moras, BSN, RN [facility] [phone] (24h on call)

## 2020-04-17 NOTE — Discharge Summary (Addendum)
Physician Discharge Summary  JAQUAVIOUS SHARMAN III [id number] DOB: 1932-01-09 DOA: 04/14/2020  PCP: [name], MD  Admit date: 04/14/2020 Discharge date: 04/17/2020  Admitted From: Home Home  disposition: Home with hospice  Recommendations for Outpatient Follow-up:  1. Take Lovenox 750 mg p.o. once a day and then 500 mg every 48 hour.  Home Health: None Equipment/Devices: None Discharge Condition: Stable CODE STATUS: DNR/DNI Diet recommendation: Regular diet  Brief/Interim Summary: Patient is 86 year old male with past medical history of bladder cancer-on chemotherapy.  His last chemo session was about 3 weeks ago.  Presented with complaint of generalized weakness since past few days.  Upon arrival to ED: Patient was found to have acute renal failure with creatinine of 6.17 elevated from baseline of 1.8 a month ago.  He was also found to have severe right-sided hydronephrosis with possible obstruction or stricture of the ureter at the bladder.  Nephrology and urology consulted.  Patient started on broad-spectrum antibiotics and admitted for further evaluation and management.  Sepsis due to ESBL Bacteremia: -Patient presented with tachycardia, tachypnea, low blood pressure, elevated lactic acid of 2.4, leukocytosis in 20,000.  PCT: 32 -CT renal study shows hydronephrosis to the level of the patient's urinary bladder.  No definite obstructing stone.  Finding may be secondary to underlying mass or stricture. -repeat renal ultrasound shows mild improvement in hydronephrosis. -Blood culture: ESBL. Changed IV Rocephin to Merem on 04/16/2020 -Catheter-blood-tinged urine noted -Appreciate urology's recommendations-Not a good candidate for further invasive intervention -Discussed with pharmacy patient will be discharged home on Levaquin 750 mg p.o. once and then 500 mg every 48 hours for 10 days.  Severe AKI: -In the setting of obstructive uropathy /hydronephrosis, solitary right  kidney -No improvement in renal function-patient decided hospice care-Nephrology signed off -Continue IV fluids.  Hold nephrotoxic medication. -Repeat BMP tomorrow a.m.  High anion gap metabolic acidosis: -In the setting of AKI. -Started on sodium bicarb infusion as per nephrology recommendations  Hyperkalemia: -Resolved  Hyponatremia: Sodium 129 improved to 131-134 Improved with IV fluids  Cancer of lateral wall of urinary bladder: -on chemotherapy.  Last chemo was about 3 weeks ago.  Followed by oncology outpatient  Thrombocytopenia: Platelet: 99 trended down to 57-51  Goals of care: Patient and his family decided to go home with hospice and transitioned to full comfort care.  Patient and his family agreed to take antibiotics.  Patient will be discharged home on Levaquin as above.  Hospice will follow up with the patient tomorrow.  Discharge Diagnoses:  Severe sepsis in the setting of ESBL UTI, E. coli bacteremia and severe right-sided obstructive uropathy Severe AKI High anion gap metabolic acidosis Hyperkalemia Hyponatremia Cancer of lateral wall of urinary bladder Thrombocytopenia  Discharge Instructions  Discharge Instructions    Diet general   Complete by: As directed    Increase activity slowly   Complete by: As directed      Allergies as of 04/17/2020   No Known Allergies     Medication List    STOP taking these medications   ibandronate 150 MG tablet Commonly known as: BONIVA   Myrbetriq 50 MG Tb24 tablet Generic drug: mirabegron ER   omeprazole 20 MG capsule Commonly known as: PRILOSEC   silodosin 8 MG Caps capsule Commonly known as: RAPAFLO   simvastatin 10 MG tablet Commonly known as: ZOCOR   triamcinolone 0.1 % Commonly known as: KENALOG   Uribel 118 MG Caps     TAKE these medications   acetaminophen 325  MG tablet Commonly known as: TYLENOL Take 650 mg by mouth every 6 (six) hours as needed for mild pain, moderate pain or  headache.   diazepam 5 MG tablet Commonly known as: VALIUM Take 5 mg by mouth at bedtime as needed for anxiety or sedation.   levofloxacin 750 MG tablet Commonly known as: Levaquin Take 1 tablet (750 mg total) by mouth once for 1 dose.   levofloxacin 500 MG tablet Commonly known as: Levaquin Take 1 tablet (500 mg total) by mouth every other day for 10 days. Start taking on: April 19, 2020   oxyCODONE-acetaminophen 5-325 MG tablet Commonly known as: PERCOCET/ROXICET Take 1 tablet by mouth every 4 (four) hours as needed for severe pain (unrelieved by APAP or Tramadol).   prochlorperazine 10 MG tablet Commonly known as: COMPAZINE Take 1 tablet (10 mg total) by mouth every 6 (six) hours as needed for nausea or vomiting.   traMADol 50 MG tablet Commonly known as: Ultram Take 1 tablet (50 mg total) by mouth every 6 (six) hours as needed for moderate pain. Post-operatively       No Known Allergies  Consultations:  Urology  Nephrology  Palliative care   Procedures/Studies: US RENAL  Result Date: 04/15/2020 CLINICAL DATA:  Acute renal failure EXAM: RENAL / URINARY TRACT ULTRASOUND COMPLETE COMPARISON:  Renal stone CT from yesterday FINDINGS: Right Kidney: Renal measurements: 15 x 7 x 7 cm = volume: 380 mL. Improved hydronephrosis. No evidence of mass or collection Left Kidney: Absent Bladder: Thick walled bladder which is collapsed around a Foley catheter. IMPRESSION: 1. Mild and improved hydronephrosis of the solitary right kidney after bladder drainage. 2. Thick walled bladder. Electronically Signed   By: Monte Fantasia M.D.   On: 04/15/2020 04:53   DG Chest Port 1 View  Result Date: 04/14/2020 CLINICAL DATA:  Generalized weakness with shortness of breath and cough x4 days. EXAM: PORTABLE CHEST 1 VIEW COMPARISON:  September 28, 2015 FINDINGS: Very mild atelectasis is seen within the left lung base. There is no evidence of acute infiltrate, pleural effusion or pneumothorax.  The heart size and mediastinal contours are within normal limits. The visualized skeletal structures are unremarkable. IMPRESSION: Very mild left basilar atelectasis. Electronically Signed   By: Virgina Norfolk M.D.   On: 04/14/2020 19:45   CT Renal Stone Study  Result Date: 04/14/2020 CLINICAL DATA:  Flank pain. Generalized weakness and shortness of breath. EXAM: CT ABDOMEN AND PELVIS WITHOUT CONTRAST TECHNIQUE: Multidetector CT imaging of the abdomen and pelvis was performed following the standard protocol without IV contrast. COMPARISON:  November 08, 2018 FINDINGS: Lower chest: There is a trace right-sided pleural effusion with adjacent atelectasis.There appears to be an ascending thoracic aortic aneurysm measuring approximately 4.3 cm. This was previously described. The intracardiac blood pool is hypodense relative to the adjacent myocardium consistent with anemia. Hepatobiliary: The liver is normal. Normal gallbladder.There is no biliary ductal dilation. Pancreas: Normal contours without ductal dilatation. No peripancreatic fluid collection. Spleen: Unremarkable. Adrenals/Urinary Tract: --Adrenal glands: Unremarkable. --Right kidney/ureter: There is severe right-sided hydronephrosis to the level of the patient's urinary bladder. There is no definite obstructing stone visualized in the distal right ureter. --Left kidney/ureter: The left kidney is absent. --Urinary bladder: There is bladder wall thickening with bladder wall calcifications as before. Stomach/Bowel: --Stomach/Duodenum: No hiatal hernia or other gastric abnormality. Normal duodenal course and caliber. --Small bowel: Unremarkable. --Colon: Unremarkable. --Appendix: Not visualized. No right lower quadrant inflammation or free fluid. Vascular/Lymphatic: Atherosclerotic calcification is present within  the non-aneurysmal abdominal aorta, without hemodynamically significant stenosis. --No retroperitoneal lymphadenopathy. --No mesenteric  lymphadenopathy. --No pelvic or inguinal lymphadenopathy. Reproductive: Unremarkable Other: No ascites or free air. There is a small amount of free fluid in the patient's abdomen and pelvis. There is a fat containing right inguinal hernia the contains a small volume of fluid. Musculoskeletal. No acute displaced fractures. IMPRESSION: 1. Severe right-sided hydronephrosis to the level of the patient's urinary bladder. There is no definite obstructing stone visualized in the distal right ureter. Findings may be secondary to an underlying mass or stricture. Urology consultation is recommended. 2. Irregular appearance of the urinary bladder as before. 3. Trace right-sided pleural effusion with adjacent atelectasis. 4. Ascending thoracic aortic aneurysm measuring up to 4.3 cm. 5. Small amount of free fluid in the patient's abdomen and pelvis. 6. Chronic findings as detailed above. Aortic Atherosclerosis (ICD10-I70.0). Electronically Signed   By: Katherine Mantlehristopher  Green M.D.   On: 04/14/2020 20:46      Subjective: Patient seen and examined.  No new complaints.  Remained afebrile overnight.  No acute events overnight.  Wishes to go home with home hospice.  Discharge Exam: Vitals:   04/17/20 0439 04/17/20 0827  BP: 139/79 139/85  Pulse: 99 96  Resp: 18 19  Temp: 97.8 F (36.6 C) 97.6 F (36.4 C)  SpO2: 98% 97%   Vitals:   04/16/20 1947 04/16/20 2350 04/17/20 0439 04/17/20 0827  BP: 117/69 112/76 139/79 139/85  Pulse: 92 93 99 96  Resp: 16 17 18 19   Temp: (!) 97.4 F (36.3 C) (!) 97.3 F (36.3 C) 97.8 F (36.6 C) 97.6 F (36.4 C)  TempSrc: Oral Oral Oral Oral  SpO2: 96% 98% 98% 97%  Weight:      Height:        General: Pt is alert, awake, not in acute distress, elderly, on room air, communicating well, appears weak. Cardiovascular: RRR, S1/S2 +, no rubs, no gallops Respiratory: CTA bilaterally, no wheezing, no rhonchi Abdominal: Soft, NT, ND, bowel sounds + Extremities: no edema, no  cyanosis    The results of significant diagnostics from this hospitalization (including imaging, microbiology, ancillary and laboratory) are listed below for reference.     Microbiology: Recent Results (from the past 240 hour(s))  Blood culture (routine x 2)     Status: Abnormal   Collection Time: 04/14/20  7:34 PM   Specimen: BLOOD  Result Value Ref Range Status   Specimen Description   Final    BLOOD BLOOD LEFT FOREARM Performed at [facility] [place], [address] [place], [place] [address]    Special Requests   Final    BOTTLES DRAWN AEROBIC AND ANAEROBIC Blood Culture adequate volume Performed at [facility] [place], [address] [place], [place] [address]    Culture  Setup Time   Final    GRAM NEGATIVE RODS IN BOTH AEROBIC AND ANAEROBIC BOTTLES Organism ID to follow CRITICAL RESULT CALLED TO, READ BACK BY AND VERIFIED WITH: EMILY SINCLAIR PHARMD @1024  04/15/20 EB Performed at [facility], [address] [place], [place] [address]    Culture ESCHERICHIA COLI (A)  Final   Report Status 04/17/2020 FINAL  Final   Organism ID, Bacteria ESCHERICHIA COLI  Final      Susceptibility   Escherichia coli - MIC*    AMPICILLIN >=32 RESISTANT Resistant     CEFAZOLIN 16 SENSITIVE Sensitive     CEFEPIME <=0.12 SENSITIVE Sensitive     CEFTAZIDIME <=  1 SENSITIVE Sensitive     CEFTRIAXONE <=0.25 SENSITIVE Sensitive     CIPROFLOXACIN <=0.25 SENSITIVE Sensitive     GENTAMICIN <=1 SENSITIVE Sensitive     IMIPENEM <=0.25 SENSITIVE Sensitive     TRIMETH/SULFA <=20 SENSITIVE Sensitive     AMPICILLIN/SULBACTAM >=32 RESISTANT Resistant     PIP/TAZO <=4 SENSITIVE Sensitive     * ESCHERICHIA COLI  Blood Culture ID Panel (Reflexed)     Status: Abnormal   Collection Time: 04/14/20  7:34 PM  Result Value Ref Range Status   Enterococcus faecalis NOT DETECTED NOT DETECTED Final   Enterococcus Faecium NOT DETECTED NOT DETECTED Final   Listeria monocytogenes NOT DETECTED NOT  DETECTED Final   Staphylococcus species NOT DETECTED NOT DETECTED Final   Staphylococcus aureus (BCID) NOT DETECTED NOT DETECTED Final   Staphylococcus epidermidis NOT DETECTED NOT DETECTED Final   Staphylococcus lugdunensis NOT DETECTED NOT DETECTED Final   Streptococcus species NOT DETECTED NOT DETECTED Final   Streptococcus agalactiae NOT DETECTED NOT DETECTED Final   Streptococcus pneumoniae NOT DETECTED NOT DETECTED Final   Streptococcus pyogenes NOT DETECTED NOT DETECTED Final   A.calcoaceticus-baumannii NOT DETECTED NOT DETECTED Final   Bacteroides fragilis NOT DETECTED NOT DETECTED Final   Enterobacterales DETECTED (A) NOT DETECTED Final    Comment: Enterobacterales represent a large order of gram negative bacteria, not a single organism. CRITICAL RESULT CALLED TO, READ BACK BY AND VERIFIED WITH: EMILY SINCLAIR PHARMD @1024  04/15/20 EB    Enterobacter cloacae complex NOT DETECTED NOT DETECTED Final   Escherichia coli DETECTED (A) NOT DETECTED Final    Comment: CRITICAL RESULT CALLED TO, READ BACK BY AND VERIFIED WITH: EMILY SINCLAIR PHARMD @1024  04/15/20 EB    Klebsiella aerogenes NOT DETECTED NOT DETECTED Final   Klebsiella oxytoca NOT DETECTED NOT DETECTED Final   Klebsiella pneumoniae NOT DETECTED NOT DETECTED Final   Proteus species NOT DETECTED NOT DETECTED Final   Salmonella species NOT DETECTED NOT DETECTED Final   Serratia marcescens NOT DETECTED NOT DETECTED Final   Haemophilus influenzae NOT DETECTED NOT DETECTED Final   Neisseria meningitidis NOT DETECTED NOT DETECTED Final   Pseudomonas aeruginosa NOT DETECTED NOT DETECTED Final   Stenotrophomonas maltophilia NOT DETECTED NOT DETECTED Final   Candida albicans NOT DETECTED NOT DETECTED Final   Candida auris NOT DETECTED NOT DETECTED Final   Candida glabrata NOT DETECTED NOT DETECTED Final   Candida krusei NOT DETECTED NOT DETECTED Final   Candida parapsilosis NOT DETECTED NOT DETECTED Final   Candida tropicalis  NOT DETECTED NOT DETECTED Final   Cryptococcus neoformans/gattii NOT DETECTED NOT DETECTED Final   CTX-M ESBL NOT DETECTED NOT DETECTED Final   Carbapenem resistance IMP NOT DETECTED NOT DETECTED Final   Carbapenem resistance KPC NOT DETECTED NOT DETECTED Final   Carbapenem resistance NDM NOT DETECTED NOT DETECTED Final   Carbapenem resist OXA 48 LIKE NOT DETECTED NOT DETECTED Final   Carbapenem resistance VIM NOT DETECTED NOT DETECTED Final    Comment: Performed at [facility], [address], [place], [place] [address]  Resp Panel by RT-PCR (Flu A&B, Covid) Nasopharyngeal Swab     Status: None   Collection Time: 04/14/20  7:35 PM   Specimen: Nasopharyngeal Swab; Nasopharyngeal(NP) swabs in vial transport medium  Result Value Ref Range Status   SARS Coronavirus 2 by RT PCR NEGATIVE NEGATIVE Final    Comment: (NOTE) SARS-CoV-2 target nucleic acids are NOT DETECTED.  The SARS-CoV-2 RNA is generally detectable in upper respiratory specimens during  the acute phase of infection. The lowest concentration of SARS-CoV-2 viral copies this assay can detect is 138 copies/mL. A negative result does not preclude SARS-Cov-2 infection and should not be used as the sole basis for treatment or other patient management decisions. A negative result may occur with  improper specimen collection/handling, submission of specimen other than nasopharyngeal swab, presence of viral mutation(s) within the areas targeted by this assay, and inadequate number of viral copies(<138 copies/mL). A negative result must be combined with clinical observations, patient history, and epidemiological information. The expected result is Negative.  Fact Sheet for Patients:  EntrepreneurPulse.com.au  Fact Sheet for Healthcare Providers:  IncredibleEmployment.be  This test is no t yet approved or cleared by the [place] FDA and  has been authorized for detection and/or  diagnosis of SARS-CoV-2 by FDA under an Emergency Use Authorization (EUA). This EUA will remain  in effect (meaning this test can be used) for the duration of the COVID-19 declaration under Section 564(b)(1) of the Act, 21 U.S.C.section 360bbb-3(b)(1), unless the authorization is terminated  or revoked sooner.       Influenza A by PCR NEGATIVE NEGATIVE Final   Influenza B by PCR NEGATIVE NEGATIVE Final    Comment: (NOTE) The Xpert Xpress SARS-CoV-2/FLU/RSV plus assay is intended as an aid in the diagnosis of influenza from Nasopharyngeal swab specimens and should not be used as a sole basis for treatment. Nasal washings and aspirates are unacceptable for Xpert Xpress SARS-CoV-2/FLU/RSV testing.  Fact Sheet for Patients: EntrepreneurPulse.com.au  Fact Sheet for Healthcare Providers: IncredibleEmployment.be  This test is not yet approved or cleared by the [place] FDA and has been authorized for detection and/or diagnosis of SARS-CoV-2 by FDA under an Emergency Use Authorization (EUA). This EUA will remain in effect (meaning this test can be used) for the duration of the COVID-19 declaration under Section 564(b)(1) of the Act, 21 U.S.C. section 360bbb-3(b)(1), unless the authorization is terminated or revoked.  Performed at [facility], [address] [place], [place] [address]   Blood culture (routine x 2)     Status: Abnormal   Collection Time: 04/14/20  8:00 PM   Specimen: BLOOD  Result Value Ref Range Status   Specimen Description   Final    BLOOD RIGHT ANTECUBITAL Performed at [facility], [address] [place], [place] [address]    Special Requests   Final    BOTTLES DRAWN AEROBIC AND ANAEROBIC Blood Culture adequate volume Performed at [facility], [address] [place], [place] [address]    Culture  Setup Time   Final    GRAM NEGATIVE RODS IN BOTH AEROBIC AND ANAEROBIC  BOTTLES CRITICAL VALUE NOTED.  VALUE IS CONSISTENT WITH PREVIOUSLY REPORTED AND CALLED VALUE.    Culture (A)  Final    ESCHERICHIA COLI SUSCEPTIBILITIES PERFORMED ON PREVIOUS CULTURE WITHIN THE LAST 5 DAYS. Performed at [facility], [place] [address] [place], [place] [address]    Report Status 04/17/2020 FINAL  Final  Urine Culture     Status: Abnormal   Collection Time: 04/14/20  9:30 PM   Specimen: Urine, Random  Result Value Ref Range Status   Specimen Description   Final    URINE, RANDOM Performed at [facility], [address] [place], [place] [address]    Special Requests   Final    NONE Performed at [facility], [address] [place],  [place] [address]    Culture (A)  Final    >=100,000 COLONIES/mL ESCHERICHIA COLI Confirmed Extended Spectrum Beta-Lactamase Producer (ESBL).  In bloodstream infections from ESBL organisms, carbapenems are preferred over piperacillin/tazobactam. They are shown to have a lower risk of mortality.    Report Status 04/16/2020 FINAL  Final   Organism ID, Bacteria ESCHERICHIA COLI (A)  Final      Susceptibility   Escherichia coli - MIC*    AMPICILLIN >=32 RESISTANT Resistant     CEFAZOLIN RESISTANT Resistant     CEFEPIME 4 INTERMEDIATE Intermediate     CEFTRIAXONE 8 RESISTANT Resistant     CIPROFLOXACIN <=0.25 SENSITIVE Sensitive     GENTAMICIN <=1 SENSITIVE Sensitive     IMIPENEM <=0.25 SENSITIVE Sensitive     NITROFURANTOIN <=16 SENSITIVE Sensitive     TRIMETH/SULFA <=20 SENSITIVE Sensitive     AMPICILLIN/SULBACTAM >=32 RESISTANT Resistant     PIP/TAZO <=4 SENSITIVE Sensitive     * >=100,000 COLONIES/mL ESCHERICHIA COLI     Labs: BNP (last 3 results) No results for input(s): BNP in the last 8760 hours. Basic Metabolic Panel: Recent Labs  Lab 04/14/20 1935 04/15/20 0407 04/16/20 0134 04/17/20 0218  NA 129* 129* 131* 134*  K 5.7* 5.4* 4.7 4.7  CL 98 98 93* 87*  CO2 11* 13* 18* 25  GLUCOSE 110*  120* 110* 92  BUN 119* 125* 142* 158*  CREATININE 6.17* 6.37* 6.84* 7.58*  CALCIUM 8.2* 7.5* 7.3* 7.1*  MG  --   --  2.0  --   PHOS  --   --   --  9.0*   Liver Function Tests: Recent Labs  Lab 04/14/20 1935 04/16/20 0134 04/17/20 0218  AST 45* 27  --   ALT 43 27  --   ALKPHOS 182* 151*  --   BILITOT 1.0 1.1  --   PROT 7.6 5.2*  --   ALBUMIN 2.8* 1.6* 1.5*   No results for input(s): LIPASE, AMYLASE in the last 168 hours. No results for input(s): AMMONIA in the last 168 hours. CBC: Recent Labs  Lab 04/14/20 1935 04/15/20 0407 04/16/20 0134 04/17/20 0218  WBC 20.6* 21.6* 23.6* 19.6*  NEUTROABS 18.4*  --  21.6*  --   HGB 12.2* 9.4* 9.2* 9.9*  HCT 35.4* 26.9* 25.3* 26.9*  MCV 85.9 83.8 80.1 79.6*  PLT 128* 99* 57* 51*   Cardiac Enzymes: No results for input(s): CKTOTAL, CKMB, CKMBINDEX, TROPONINI in the last 168 hours. BNP: Invalid input(s): POCBNP CBG: No results for input(s): GLUCAP in the last 168 hours. D-Dimer No results for input(s): DDIMER in the last 72 hours. Hgb A1c No results for input(s): HGBA1C in the last 72 hours. Lipid Profile No results for input(s): CHOL, HDL, LDLCALC, TRIG, CHOLHDL, LDLDIRECT in the last 72 hours. Thyroid function studies No results for input(s): TSH, T4TOTAL, T3FREE, THYROIDAB in the last 72 hours.  Invalid input(s): FREET3 Anemia work up No results for input(s): VITAMINB12, FOLATE, FERRITIN, TIBC, IRON, RETICCTPCT in the last 72 hours. Urinalysis    Component Value Date/Time   COLORURINE YELLOW 04/14/2020 2130   APPEARANCEUR CLOUDY (A) 04/14/2020 2130   LABSPEC 1.015 04/14/2020 2130   PHURINE 6.0 04/14/2020 2130   GLUCOSEU NEGATIVE 04/14/2020 2130   HGBUR LARGE (A) 04/14/2020 2130   BILIRUBINUR NEGATIVE 04/14/2020 2130   KETONESUR NEGATIVE 04/14/2020 2130   PROTEINUR >300 (A) 04/14/2020 2130   NITRITE NEGATIVE 04/14/2020 2130   LEUKOCYTESUR LARGE (A) 04/14/2020 2130   Sepsis Labs Invalid  input(s): PROCALCITONIN,   WBC,  LACTICIDVEN Microbiology Recent Results (from the past 240 hour(s))  Blood culture (routine x 2)     Status: Abnormal   Collection Time: 04/14/20  7:34 PM   Specimen: BLOOD  Result Value Ref Range Status   Specimen Description   Final    BLOOD BLOOD LEFT FOREARM Performed at [facility], [address] [place], [place] [address]    Special Requests   Final    BOTTLES DRAWN AEROBIC AND ANAEROBIC Blood Culture adequate volume Performed at [facility], [address] [place], [place] [address]    Culture  Setup Time   Final    GRAM NEGATIVE RODS IN BOTH AEROBIC AND ANAEROBIC BOTTLES Organism ID to follow CRITICAL RESULT CALLED TO, READ BACK BY AND VERIFIED WITH: EMILY SINCLAIR PHARMD @1024  04/15/20 EB Performed at [facility], [place] [address] [place], [place] [address]    Culture ESCHERICHIA COLI (A)  Final   Report Status 04/17/2020 FINAL  Final   Organism ID, Bacteria ESCHERICHIA COLI  Final      Susceptibility   Escherichia coli - MIC*    AMPICILLIN >=32 RESISTANT Resistant     CEFAZOLIN 16 SENSITIVE Sensitive     CEFEPIME <=0.12 SENSITIVE Sensitive     CEFTAZIDIME <=1 SENSITIVE Sensitive     CEFTRIAXONE <=0.25 SENSITIVE Sensitive     CIPROFLOXACIN <=0.25 SENSITIVE Sensitive     GENTAMICIN <=1 SENSITIVE Sensitive     IMIPENEM <=0.25 SENSITIVE Sensitive     TRIMETH/SULFA <=20 SENSITIVE Sensitive     AMPICILLIN/SULBACTAM >=32 RESISTANT Resistant     PIP/TAZO <=4 SENSITIVE Sensitive     * ESCHERICHIA COLI  Blood Culture ID Panel (Reflexed)     Status: Abnormal   Collection Time: 04/14/20  7:34 PM  Result Value Ref Range Status   Enterococcus faecalis NOT DETECTED NOT DETECTED Final   Enterococcus Faecium NOT DETECTED NOT DETECTED Final   Listeria monocytogenes NOT DETECTED NOT DETECTED Final   Staphylococcus species NOT DETECTED NOT DETECTED Final   Staphylococcus aureus (BCID) NOT DETECTED NOT DETECTED Final   Staphylococcus  epidermidis NOT DETECTED NOT DETECTED Final   Staphylococcus lugdunensis NOT DETECTED NOT DETECTED Final   Streptococcus species NOT DETECTED NOT DETECTED Final   Streptococcus agalactiae NOT DETECTED NOT DETECTED Final   Streptococcus pneumoniae NOT DETECTED NOT DETECTED Final   Streptococcus pyogenes NOT DETECTED NOT DETECTED Final   A.calcoaceticus-baumannii NOT DETECTED NOT DETECTED Final   Bacteroides fragilis NOT DETECTED NOT DETECTED Final   Enterobacterales DETECTED (A) NOT DETECTED Final    Comment: Enterobacterales represent a large order of gram negative bacteria, not a single organism. CRITICAL RESULT CALLED TO, READ BACK BY AND VERIFIED WITH: EMILY SINCLAIR PHARMD @1024  04/15/20 EB    Enterobacter cloacae complex NOT DETECTED NOT DETECTED Final   Escherichia coli DETECTED (A) NOT DETECTED Final    Comment: CRITICAL RESULT CALLED TO, READ BACK BY AND VERIFIED WITH: EMILY SINCLAIR PHARMD @1024  04/15/20 EB    Klebsiella aerogenes NOT DETECTED NOT DETECTED Final   Klebsiella oxytoca NOT DETECTED NOT DETECTED Final   Klebsiella pneumoniae NOT DETECTED NOT DETECTED Final   Proteus species NOT DETECTED NOT DETECTED Final   Salmonella species NOT DETECTED NOT DETECTED Final   Serratia marcescens NOT DETECTED NOT DETECTED Final   Haemophilus influenzae NOT DETECTED NOT DETECTED Final   Neisseria meningitidis NOT DETECTED NOT DETECTED Final   Pseudomonas aeruginosa NOT DETECTED NOT DETECTED  Final   Stenotrophomonas maltophilia NOT DETECTED NOT DETECTED Final   Candida albicans NOT DETECTED NOT DETECTED Final   Candida auris NOT DETECTED NOT DETECTED Final   Candida glabrata NOT DETECTED NOT DETECTED Final   Candida krusei NOT DETECTED NOT DETECTED Final   Candida parapsilosis NOT DETECTED NOT DETECTED Final   Candida tropicalis NOT DETECTED NOT DETECTED Final   Cryptococcus neoformans/gattii NOT DETECTED NOT DETECTED Final   CTX-M ESBL NOT DETECTED NOT DETECTED Final    Carbapenem resistance IMP NOT DETECTED NOT DETECTED Final   Carbapenem resistance KPC NOT DETECTED NOT DETECTED Final   Carbapenem resistance NDM NOT DETECTED NOT DETECTED Final   Carbapenem resist OXA 48 LIKE NOT DETECTED NOT DETECTED Final   Carbapenem resistance VIM NOT DETECTED NOT DETECTED Final    Comment: Performed at [facility], [address] [place], [place] [address]  Resp Panel by RT-PCR (Flu A&B, Covid) Nasopharyngeal Swab     Status: None   Collection Time: 04/14/20  7:35 PM   Specimen: Nasopharyngeal Swab; Nasopharyngeal(NP) swabs in vial transport medium  Result Value Ref Range Status   SARS Coronavirus 2 by RT PCR NEGATIVE NEGATIVE Final    Comment: (NOTE) SARS-CoV-2 target nucleic acids are NOT DETECTED.  The SARS-CoV-2 RNA is generally detectable in upper respiratory specimens during the acute phase of infection. The lowest concentration of SARS-CoV-2 viral copies this assay can detect is 138 copies/mL. A negative result does not preclude SARS-Cov-2 infection and should not be used as the sole basis for treatment or other patient management decisions. A negative result may occur with  improper specimen collection/handling, submission of specimen other than nasopharyngeal swab, presence of viral mutation(s) within the areas targeted by this assay, and inadequate number of viral copies(<138 copies/mL). A negative result must be combined with clinical observations, patient history, and epidemiological information. The expected result is Negative.  Fact Sheet for Patients:  EntrepreneurPulse.com.au  Fact Sheet for Healthcare Providers:  IncredibleEmployment.be  This test is no t yet approved or cleared by the [place] FDA and  has been authorized for detection and/or diagnosis of SARS-CoV-2 by FDA under an Emergency Use Authorization (EUA). This EUA will remain  in effect (meaning this test can be used) for the  duration of the COVID-19 declaration under Section 564(b)(1) of the Act, 21 U.S.C.section 360bbb-3(b)(1), unless the authorization is terminated  or revoked sooner.       Influenza A by PCR NEGATIVE NEGATIVE Final   Influenza B by PCR NEGATIVE NEGATIVE Final    Comment: (NOTE) The Xpert Xpress SARS-CoV-2/FLU/RSV plus assay is intended as an aid in the diagnosis of influenza from Nasopharyngeal swab specimens and should not be used as a sole basis for treatment. Nasal washings and aspirates are unacceptable for Xpert Xpress SARS-CoV-2/FLU/RSV testing.  Fact Sheet for Patients: EntrepreneurPulse.com.au  Fact Sheet for Healthcare Providers: IncredibleEmployment.be  This test is not yet approved or cleared by the [place] FDA and has been authorized for detection and/or diagnosis of SARS-CoV-2 by FDA under an Emergency Use Authorization (EUA). This EUA will remain in effect (meaning this test can be used) for the duration of the COVID-19 declaration under Section 564(b)(1) of the Act, 21 U.S.C. section 360bbb-3(b)(1), unless the authorization is terminated or revoked.  Performed at [facility] [address] [place], [place] [address]   Blood culture (routine x 2)     Status: Abnormal   Collection Time: 04/14/20  8:00 PM  Specimen: BLOOD  Result Value Ref Range Status   Specimen Description   Final    BLOOD RIGHT ANTECUBITAL Performed at [facility], [address] [place], [place] [address]    Special Requests   Final    BOTTLES DRAWN AEROBIC AND ANAEROBIC Blood Culture adequate volume Performed at [facility], [address] [place], [place] [address]    Culture  Setup Time   Final    GRAM NEGATIVE RODS IN BOTH AEROBIC AND ANAEROBIC BOTTLES CRITICAL VALUE NOTED.  VALUE IS CONSISTENT WITH PREVIOUSLY REPORTED AND CALLED VALUE.    Culture (A)  Final    ESCHERICHIA COLI SUSCEPTIBILITIES  PERFORMED ON PREVIOUS CULTURE WITHIN THE LAST 5 DAYS. Performed at [facility], [place] [address], [place], [place] [address]    Report Status 04/17/2020 FINAL  Final  Urine Culture     Status: Abnormal   Collection Time: 04/14/20  9:30 PM   Specimen: Urine, Random  Result Value Ref Range Status   Specimen Description   Final    URINE, RANDOM Performed at [facility], [address] [place], [place] [address]    Special Requests   Final    NONE Performed at [facility], [address] [place], [place] [address]    Culture (A)  Final    >=100,000 COLONIES/mL ESCHERICHIA COLI Confirmed Extended Spectrum Beta-Lactamase Producer (ESBL).  In bloodstream infections from ESBL organisms, carbapenems are preferred over piperacillin/tazobactam. They are shown to have a lower risk of mortality.    Report Status 04/16/2020 FINAL  Final   Organism ID, Bacteria ESCHERICHIA COLI (A)  Final      Susceptibility   Escherichia coli - MIC*    AMPICILLIN >=32 RESISTANT Resistant     CEFAZOLIN RESISTANT Resistant     CEFEPIME 4 INTERMEDIATE Intermediate     CEFTRIAXONE 8 RESISTANT Resistant     CIPROFLOXACIN <=0.25 SENSITIVE Sensitive     GENTAMICIN <=1 SENSITIVE Sensitive     IMIPENEM <=0.25 SENSITIVE Sensitive     NITROFURANTOIN <=16 SENSITIVE Sensitive     TRIMETH/SULFA <=20 SENSITIVE Sensitive     AMPICILLIN/SULBACTAM >=32 RESISTANT Resistant     PIP/TAZO <=4 SENSITIVE Sensitive     * >=100,000 COLONIES/mL ESCHERICHIA COLI     Time coordinating discharge: Over 30 minutes  SIGNED:   Mckinley Jewel, MD  [facility] 04/17/2020, 2:06 PM Pager   If 7PM-7AM, please contact night-coverage [email or web address]

## 2020-05-06 ENCOUNTER — Other Ambulatory Visit: Payer: Medicare Other

## 2020-05-13 ENCOUNTER — Ambulatory Visit: Payer: Medicare Other | Admitting: Oncology

## 2020-05-13 ENCOUNTER — Other Ambulatory Visit: Payer: Medicare Other

## 2020-05-13 ENCOUNTER — Ambulatory Visit: Payer: Medicare Other

## 2020-06-04 ENCOUNTER — Telehealth: Payer: Self-pay

## 2020-06-04 NOTE — Telephone Encounter (Signed)
Called patient's daughter and let her know Dr. Hazeline Junker response to her question. She verbalized understanding.

## 2020-06-04 NOTE — Telephone Encounter (Signed)
-----   Message from Wyatt Portela, MD sent at 06/04/2020  3:41 PM EST ----- I told Dr. Lyman Speller that I have no objections to admission to hospice.  I do not need to evaluate him at this time.  Thanks ----- Message ----- From: Tami Lin, RN Sent: 06/04/2020   3:33 PM EST To: Wyatt Portela, MD  Patient's daughter called and stated patient is in hospice. She says that she was told by Dr. Wilhemina Cash nurse that  Dr. Lyman Speller contacted you to discuss patient's admission to hospice and that you said you needed to see the patient in person or virtually. She is calling to confirm if this is true. She said Dr. Tresa Moore referred her father to hospice but she was told by the hospice staff to contact you.  Lanelle Bal

## 2020-06-09 ENCOUNTER — Emergency Department (HOSPITAL_BASED_OUTPATIENT_CLINIC_OR_DEPARTMENT_OTHER)
Admission: EM | Admit: 2020-06-09 | Discharge: 2020-06-09 | Disposition: A | Discharge status: Home / Self Care | Attending: Emergency Medicine | Admitting: Emergency Medicine

## 2020-06-09 ENCOUNTER — Encounter (HOSPITAL_BASED_OUTPATIENT_CLINIC_OR_DEPARTMENT_OTHER): Payer: Self-pay | Admitting: Emergency Medicine

## 2020-06-09 ENCOUNTER — Emergency Department (HOSPITAL_BASED_OUTPATIENT_CLINIC_OR_DEPARTMENT_OTHER)

## 2020-06-09 DIAGNOSIS — Z87891 Personal history of nicotine dependence: Secondary | ICD-10-CM | POA: Insufficient documentation

## 2020-06-09 DIAGNOSIS — S0101XA Laceration without foreign body of scalp, initial encounter: Secondary | ICD-10-CM | POA: Diagnosis not present

## 2020-06-09 DIAGNOSIS — S81812A Laceration without foreign body, left lower leg, initial encounter: Secondary | ICD-10-CM | POA: Insufficient documentation

## 2020-06-09 DIAGNOSIS — S0081XA Abrasion of other part of head, initial encounter: Secondary | ICD-10-CM | POA: Insufficient documentation

## 2020-06-09 DIAGNOSIS — Z23 Encounter for immunization: Secondary | ICD-10-CM | POA: Diagnosis not present

## 2020-06-09 DIAGNOSIS — S61511A Laceration without foreign body of right wrist, initial encounter: Secondary | ICD-10-CM | POA: Insufficient documentation

## 2020-06-09 DIAGNOSIS — I10 Essential (primary) hypertension: Secondary | ICD-10-CM | POA: Diagnosis not present

## 2020-06-09 DIAGNOSIS — Z8551 Personal history of malignant neoplasm of bladder: Secondary | ICD-10-CM | POA: Diagnosis not present

## 2020-06-09 DIAGNOSIS — Z79899 Other long term (current) drug therapy: Secondary | ICD-10-CM | POA: Insufficient documentation

## 2020-06-09 DIAGNOSIS — I499 Cardiac arrhythmia, unspecified: Secondary | ICD-10-CM | POA: Diagnosis not present

## 2020-06-09 DIAGNOSIS — S0990XA Unspecified injury of head, initial encounter: Secondary | ICD-10-CM | POA: Diagnosis not present

## 2020-06-09 DIAGNOSIS — Y9389 Activity, other specified: Secondary | ICD-10-CM | POA: Diagnosis not present

## 2020-06-09 DIAGNOSIS — W1830XA Fall on same level, unspecified, initial encounter: Secondary | ICD-10-CM | POA: Insufficient documentation

## 2020-06-09 DIAGNOSIS — S0003XA Contusion of scalp, initial encounter: Secondary | ICD-10-CM | POA: Insufficient documentation

## 2020-06-09 DIAGNOSIS — Y92009 Unspecified place in unspecified non-institutional (private) residence as the place of occurrence of the external cause: Secondary | ICD-10-CM | POA: Diagnosis not present

## 2020-06-09 DIAGNOSIS — S8992XA Unspecified injury of left lower leg, initial encounter: Secondary | ICD-10-CM | POA: Diagnosis present

## 2020-06-09 DIAGNOSIS — S0181XA Laceration without foreign body of other part of head, initial encounter: Secondary | ICD-10-CM | POA: Diagnosis not present

## 2020-06-09 DIAGNOSIS — Z743 Need for continuous supervision: Secondary | ICD-10-CM | POA: Diagnosis not present

## 2020-06-09 DIAGNOSIS — W19XXXA Unspecified fall, initial encounter: Secondary | ICD-10-CM

## 2020-06-09 DIAGNOSIS — S0001XA Abrasion of scalp, initial encounter: Secondary | ICD-10-CM

## 2020-06-09 MED ORDER — LIDOCAINE-EPINEPHRINE (PF) 2 %-1:200000 IJ SOLN
10.0000 mL | Freq: Once | INTRAMUSCULAR | Status: AC
  Administered 2020-06-09: 10 mL via INTRADERMAL
  Filled 2020-06-09: qty 20

## 2020-06-09 MED ORDER — TETANUS-DIPHTH-ACELL PERTUSSIS 5-2.5-18.5 LF-MCG/0.5 IM SUSY
0.5000 mL | PREFILLED_SYRINGE | Freq: Once | INTRAMUSCULAR | Status: AC
  Administered 2020-06-09: 0.5 mL via INTRAMUSCULAR
  Filled 2020-06-09: qty 0.5

## 2020-06-09 MED ORDER — LIDOCAINE-EPINEPHRINE 2 %-1:100000 IJ SOLN: 20.0000 mL | Freq: Once | INTRAMUSCULAR | Status: DC

## 2020-06-09 NOTE — ED Provider Notes (Signed)
[place] DEPT [facility] Provider Note: Georgena Spurling, MD, FACEP  CSN: [id number] MRN: [id number] ARRIVAL: 06/09/20 at [place] ROOM: [place]  Fall   HISTORY OF PRESENT ILLNESS  06/09/20 5:12 AM [facility] is a 87 y.o. male got up to check on his wife just prior to arrival this morning.  In the process he lost his balance and fell.  He has a laceration to his forehead as well as skin tears to his right wrist and left knee.  There was profuse bleeding from the laceration on his forehead but it has been controlled with pressure.  He also has a skin tear to his right elbow which occurred several days ago.  He is not sure of his last tetanus shot.  He denies any pain.  He was placed in a cervical collar prior to arrival but denies any neck pain or injury.   Past Medical History:  Diagnosis Date  . Arthritis    hands  . Bladder cancer [facility]) urologist-  dr ottelin/  oncologist---dr shadad   dx 04-14-2017 per high grade invasive urothelial carcinoma;   s/p  TURBT;    completed chemo/ radiation 04/ 2019  . Congenital absence of left kidney   . GERD (gastroesophageal reflux disease)   . History of basal cell carcinoma excision    hx multiple BCC of skin removals  . History of cancer chemotherapy    completed 07-2017  . History of chronic prostatitis UROLOGIST-  DR Consuella Lose  . History of external beam radiation therapy    completed 04-/ 2019 for bladder cancer  . History of hypertension    per pt no medication since 2018, blood pressure normalized  . History of kidney stones   . History of squamous cell carcinoma excision    multiple SCC of skin removals  . Hyperlipidemia   . IBS (irritable bowel syndrome)   . IBS (irritable bowel syndrome)   . Osteopenia   . Solitary kidney    right  . Wears glasses     Past Surgical History:  Procedure Laterality Date  . bcg treatment  last done may 2021   x6  . CYSTOSCOPY W/ RETROGRADES Bilateral 01/03/2020    Procedure: CYSTOSCOPY WITH RETROGRADE PYELOGRAM;  Surgeon: Alexis Frock, MD;  Location: [facility];  Service: Urology;  Laterality: Bilateral;  . EXCISION BENIGN LYMPH NODE LEFT CERCIAL REGION  09-11-2013    dr toth  [facility]  . HEMORROIDECTOMY  05/30/2005  . RIGHT URETEROSCOPY W/ STONE EXTRACTION AND STENT PLACEMENT  04-04-2008    dr Everlene Balls  [facility]  . TRANSURETHRAL RESECTION OF BLADDER TUMOR N/A 04/14/2017   Procedure: TRANSURETHRAL RESECTION OF BLADDER TUMOR  (TURBT);  Surgeon: Kathie Rhodes, MD;  Location: [facility];  Service: Urology;  Laterality: N/A;  . TRANSURETHRAL RESECTION OF BLADDER TUMOR N/A 05/22/2017   Procedure: TRANSURETHRAL RESECTION OF BLADDER TUMOR (TURBT);  Surgeon: Kathie Rhodes, MD;  Location: [facility];  Service: Urology;  Laterality: N/A;  . TRANSURETHRAL RESECTION OF BLADDER TUMOR N/A 12/07/2018   Procedure: CYSTOSCOPY, TRANSURETHRAL RESECTION OF BLADDER TUMOR (TURBT) WITH INSTILLATION OF POST OPERATIVE CHEMOTHERAPY;  Surgeon: Kathie Rhodes, MD;  Location: [place];  Service: Urology;  Laterality: N/A;  ONLY NEEDS 60 MIN  . TRANSURETHRAL RESECTION OF BLADDER TUMOR N/A 07/15/2019   Procedure: TRANSURETHRAL RESECTION OF BLADDER TUMOR (TURBT)/ CYSTOSCOPY;  Surgeon: Kathie Rhodes, MD;  Location: [facility];  Service: Urology;  Laterality: N/A;  . TRANSURETHRAL RESECTION OF BLADDER TUMOR N/A 01/03/2020   Procedure: TRANSURETHRAL RESECTION OF BLADDER TUMOR (TURBT);  Surgeon: Alexis Frock, MD;  Location: [facility];  Service: Urology;  Laterality: N/A;  1 HR    Family History  Problem Relation Age of Onset  . Prostate cancer Father   . Bladder Cancer Brother        bladder/dx 30 years ago    Social History   Tobacco Use  . Smoking status: Former Smoker    Packs/day: 1.00    Years: 10.00    Pack years: 10.00    Types: Cigarettes    Quit date: 04/11/1964    Years since  quitting: 56.2  . Smokeless tobacco: Never Used  Vaping Use  . Vaping Use: Never used  Substance Use Topics  . Alcohol use: Yes    Comment: 1-2 shots vodka per day  . Drug use: No    Prior to Admission medications   Medication Sig Start Date End Date Taking? Authorizing Provider  acetaminophen (TYLENOL) 325 MG tablet Take 650 mg by mouth every 6 (six) hours as needed for mild pain, moderate pain or headache.    [provider]  diazepam (VALIUM) 5 MG tablet Take 5 mg by mouth at bedtime as needed for anxiety or sedation.    [provider]  ibandronate (BONIVA) 150 MG tablet TAKE 1 TABLET BY MOUTH ONCE A MONTH    [provider]  omeprazole (PRILOSEC) 20 MG capsule Take 1 tablet by mouth daily.    [provider]  oxybutynin (DITROPAN) 5 MG tablet TAKE 1 TABLET BY MOUTH THREE TIMES DAILY AS NEEDED FOR BLADDER SPASMS 05/20/20   [provider]  oxyCODONE-acetaminophen (PERCOCET/ROXICET) 5-325 MG tablet Take 1 tablet by mouth every 4 (four) hours as needed for severe pain (unrelieved by APAP or Tramadol). 04/17/20   Pahwani, Michell Heinrich, MD  senna (SENOKOT) 8.6 MG TABS tablet TAKE 1 TABLET BY MOUTH TWICE DAILY AS NEEDED FOR CONSTIPATION 05/20/20   [provider]  simvastatin (ZOCOR) 10 MG tablet every evening.    [provider]  prochlorperazine (COMPAZINE) 10 MG tablet Take 1 tablet (10 mg total) by mouth every 6 (six) hours as needed for nausea or vomiting. 02/06/20 06/09/20  Wyatt Portela, MD    Allergies Patient has no known allergies.   REVIEW OF SYSTEMS  Negative except as noted here or in the History of Present Illness.   PHYSICAL EXAMINATION  Initial Vital Signs Blood pressure (!) 154/98, pulse 95, temperature 98.1 F (36.7 C), temperature source Oral, resp. rate 20, height 5' 9.5" (1.765 m), weight 64.8 kg, SpO2 100 %.  Examination General: Well-developed, well-nourished male in no acute distress; appearance  consistent with age of record HENT: normocephalic; large forehead laceration with small abrasion and hematoma to crown of head, abrasion overlying right zygomatic arch:    Eyes: pupils equal, round and reactive to light; extraocular muscles intact Neck: supple; nontender Heart: regular rate and rhythm Lungs: clear to auscultation bilaterally Abdomen: soft; nondistended; nontender; bowel sounds present GU: Foley catheter leg bag in place Extremities: No acute deformity; full range of motion; pulses normal Neurologic: Awake, alert and oriented; motor function intact in all extremities and symmetric; no facial droop Skin: Warm and dry; skin tears right forearm and left knee:        Psychiatric: Normal mood and affect   RESULTS    Summary of this visit's results, reviewed and  interpreted by myself:   EKG Interpretation  Date/Time:    Ventricular Rate:    PR Interval:    QRS Duration:   QT Interval:    QTC Calculation:   R Axis:     Text Interpretation:        Laboratory Studies: No results found for this or any previous visit (from the past 24 hour(s)). Imaging Studies: CT Head Wo Contrast  Result Date: 06/09/2020 CLINICAL DATA:  Head trauma with intracranial venous injury suspected. Head laceration EXAM: CT HEAD WITHOUT CONTRAST TECHNIQUE: Contiguous axial images were obtained from the base of the skull through the vertex without intravenous contrast. COMPARISON:  None. FINDINGS: Brain: No evidence of acute infarction, hemorrhage, hydrocephalus, extra-axial collection or mass lesion/mass effect. Age congruent cerebral volume loss and periventricular low-density. Vascular: No hyperdense vessel or unexpected calcification. Skull: Forehead laceration without calvarial fracture or opaque foreign body. Sinuses/Orbits: Bilateral cataract resection. IMPRESSION: 1. No evidence of intracranial injury. 2. Forehead laceration without fracture. Electronically Signed   By: Monte Fantasia M.D.   On: 06/09/2020 05:51    ED COURSE and MDM  Nursing notes, initial and subsequent vitals signs, including pulse oximetry, reviewed and interpreted by myself.  Vitals:   06/09/20 0508 06/09/20 0510  BP: (!) 154/98   Pulse: 95   Resp: 20   Temp: 98.1 F (36.7 C)   TempSrc: Oral   SpO2: 100%   Weight:  64.8 kg  Height:  5' 9.5" (1.765 m)   Medications  Tdap (BOOSTRIX) injection 0.5 mL (has no administration in time range)  lidocaine-EPINEPHrine (XYLOCAINE W/EPI) 2 %-1:200000 (PF) injection 10 mL (has no administration in time range)   Forehead wound closed.  No evidence of significant intracranial injury on CT scan.  Advised of signs/symptoms that would warrant return.   PROCEDURES  Procedures LACERATION REPAIR Performed by: Karen Chafe Micahel Omlor Authorized by: Karen Chafe Aithana Kushner Consent: Verbal consent obtained. Risks and benefits: risks, benefits and alternatives were discussed Consent given by: patient Patient identity confirmed: provided demographic data Prepped and Draped in normal sterile fashion Wound explored  Laceration Location: Frontal scalp  Laceration Length: 6 cm  No Foreign Bodies seen or palpated  Anesthesia: local infiltration  Local anesthetic: lidocaine 2% with epinephrine  Anesthetic total: None ml  Irrigation method: syringe Amount of cleaning: standard  Skin closure: 4-0 Prolene  Number of sutures: 11  Technique: Simple interrupted  Patient tolerance: Patient tolerated the procedure well with no immediate complications.    ED DIAGNOSES     ICD-10-CM   1. Laceration of scalp, initial encounter  S01.01XA   2. Fall in home, initial encounter  W19.XXXA    Y92.009   3. Minor head injury, initial encounter  S09.90XA   4. Abrasion of face, initial encounter  S00.81XA   5. Tear of skin of right wrist, initial encounter  S61.511A   6. Skin tear of lower leg without complication, left, initial encounter  S81.812A   7. Scalp hematoma,  initial encounter  S00.03XA   8. Abrasion of scalp, initial encounter  S00.01XA        Wylodean Shimmel, Jenny Reichmann, MD 06/09/20 [phone]

## 2020-06-09 NOTE — ED Triage Notes (Signed)
Pt states went to check on his wife then to rest room to change his regular urine collection bag to a leg bag and "just went down" lac to head possible tear to right wrist. Right elbow is wrapped from fall 3-4 days ago.

## 2020-06-30 ENCOUNTER — Encounter (HOSPITAL_BASED_OUTPATIENT_CLINIC_OR_DEPARTMENT_OTHER): Payer: Self-pay

## 2020-06-30 ENCOUNTER — Emergency Department (HOSPITAL_BASED_OUTPATIENT_CLINIC_OR_DEPARTMENT_OTHER)
Admission: EM | Admit: 2020-06-30 | Discharge: 2020-06-30 | Disposition: A | Discharge status: Home / Self Care | Attending: Emergency Medicine | Admitting: Emergency Medicine

## 2020-06-30 ENCOUNTER — Other Ambulatory Visit: Payer: Self-pay

## 2020-06-30 DIAGNOSIS — S51811A Laceration without foreign body of right forearm, initial encounter: Secondary | ICD-10-CM | POA: Diagnosis not present

## 2020-06-30 DIAGNOSIS — S61411A Laceration without foreign body of right hand, initial encounter: Secondary | ICD-10-CM | POA: Diagnosis not present

## 2020-06-30 DIAGNOSIS — S0990XA Unspecified injury of head, initial encounter: Secondary | ICD-10-CM | POA: Diagnosis not present

## 2020-06-30 DIAGNOSIS — I10 Essential (primary) hypertension: Secondary | ICD-10-CM | POA: Insufficient documentation

## 2020-06-30 DIAGNOSIS — Z87891 Personal history of nicotine dependence: Secondary | ICD-10-CM | POA: Diagnosis not present

## 2020-06-30 DIAGNOSIS — Y93G1 Activity, food preparation and clean up: Secondary | ICD-10-CM | POA: Insufficient documentation

## 2020-06-30 DIAGNOSIS — S51011A Laceration without foreign body of right elbow, initial encounter: Secondary | ICD-10-CM | POA: Diagnosis not present

## 2020-06-30 DIAGNOSIS — S59901A Unspecified injury of right elbow, initial encounter: Secondary | ICD-10-CM | POA: Diagnosis present

## 2020-06-30 DIAGNOSIS — S0081XA Abrasion of other part of head, initial encounter: Secondary | ICD-10-CM | POA: Insufficient documentation

## 2020-06-30 DIAGNOSIS — Z8551 Personal history of malignant neoplasm of bladder: Secondary | ICD-10-CM | POA: Diagnosis not present

## 2020-06-30 DIAGNOSIS — T148XXA Other injury of unspecified body region, initial encounter: Secondary | ICD-10-CM

## 2020-06-30 DIAGNOSIS — W19XXXA Unspecified fall, initial encounter: Secondary | ICD-10-CM | POA: Insufficient documentation

## 2020-06-30 NOTE — ED Notes (Signed)
Hospice notified of patients admission to our ED.

## 2020-06-30 NOTE — ED Notes (Signed)
Hospice was notified by primary RN Roselyn Reef

## 2020-06-30 NOTE — Discharge Instructions (Signed)
Do not get the areas wet with the Steri-Strips for at least the next 48 hours.  Please return for redness drainage or if you develop a fever.  You have elected not to get a head CT for your head injury.  Please return if you change your mind or if you develop confusion headaches nausea or vomiting.

## 2020-06-30 NOTE — ED Provider Notes (Signed)
[facility] EMERGENCY DEPT Provider Note   CSN: [id number] Arrival date & time: 06/30/20  2033     History Chief Complaint  Patient presents with  . Fall    Patient tripped over the dishwasher door while loading the dishwasher. Stated he had two and a half martinis before dinner.  . Wound Check    Skin tear to right forearm.    STRIDER VALLANCE III is a 87 y.o. male.  87 yo M with a chief complaints of a fall.  The patient was doing his dishes and he bumped his leg on the dishwasher and fell down.  Complaining mostly of skin tears to the right arm.  Worse one right at the elbow.  He denies headache denies neck pain denies chest pain abdominal pain denies back pain.  Denies bony injury.  Has been able to walk after the episode.  Not on blood thinners.  The history is provided by the patient.  Fall This is a new problem. The current episode started less than 1 hour ago. The problem occurs constantly. The problem has not changed since onset.Pertinent negatives include no chest pain, no abdominal pain, no headaches and no shortness of breath. Nothing aggravates the symptoms. Nothing relieves the symptoms. He has tried nothing for the symptoms. The treatment provided no relief.  Wound Check Pertinent negatives include no chest pain, no abdominal pain, no headaches and no shortness of breath.       Past Medical History:  Diagnosis Date  . Arthritis    hands  . Bladder cancer [facility]) urologist-  dr ottelin/  oncologist---dr shadad   dx 04-14-2017 per high grade invasive urothelial carcinoma;   s/p  TURBT;    completed chemo/ radiation 04/ 2019  . Congenital absence of left kidney   . GERD (gastroesophageal reflux disease)   . History of basal cell carcinoma excision    hx multiple BCC of skin removals  . History of cancer chemotherapy    completed 07-2017  . History of chronic prostatitis UROLOGIST-  DR Consuella Lose  . History of external beam radiation therapy    completed  04-/ 2019 for bladder cancer  . History of hypertension    per pt no medication since 2018, blood pressure normalized  . History of kidney stones   . History of squamous cell carcinoma excision    multiple SCC of skin removals  . Hyperlipidemia   . IBS (irritable bowel syndrome)   . IBS (irritable bowel syndrome)   . Osteopenia   . Solitary kidney    right  . Wears glasses     Patient Active Problem List   Diagnosis Date Noted  . Hyperkalemia 04/15/2020  . Pyelonephritis 04/15/2020  . Hydronephrosis of right kidney 04/15/2020  . Sepsis ([place]) 04/15/2020  . Hyponatremia 04/15/2020  . ARF (acute renal failure) ([place]) 04/14/2020  . Goals of care, counseling/discussion 02/06/2020  . Solitary kidney   . Rectal pain 07/11/2017  . Cancer of lateral wall of urinary bladder ([place]) 05/12/2017  . Lymphadenopathy of left cervical region 05/30/2013  . Left foot pain 10/15/2012  . ABNORMALITY OF GAIT 03/03/2010  . HIP PAIN, RIGHT 02/09/2010  . ITBS, LEFT KNEE 09/02/2009  . OTHER ACQUIRED DEFORMITY OF ANKLE AND FOOT OTHER 09/02/2009    Past Surgical History:  Procedure Laterality Date  . bcg treatment  last done may 2021   x6  . CYSTOSCOPY W/ RETROGRADES Bilateral 01/03/2020   Procedure: CYSTOSCOPY WITH RETROGRADE PYELOGRAM;  Surgeon: Tresa Moore,  Hubbard Robinson, MD;  Location: [facility];  Service: Urology;  Laterality: Bilateral;  . EXCISION BENIGN LYMPH NODE LEFT CERCIAL REGION  09-11-2013    dr toth  [facility]  . HEMORROIDECTOMY  05/30/2005  . RIGHT URETEROSCOPY W/ STONE EXTRACTION AND STENT PLACEMENT  04-04-2008    dr Everlene Balls  [facility]  . TRANSURETHRAL RESECTION OF BLADDER TUMOR N/A 04/14/2017   Procedure: TRANSURETHRAL RESECTION OF BLADDER TUMOR  (TURBT);  Surgeon: Kathie Rhodes, MD;  Location: [facility];  Service: Urology;  Laterality: N/A;  . TRANSURETHRAL RESECTION OF BLADDER TUMOR N/A 05/22/2017   Procedure: TRANSURETHRAL RESECTION OF BLADDER TUMOR (TURBT);   Surgeon: Kathie Rhodes, MD;  Location: [facility];  Service: Urology;  Laterality: N/A;  . TRANSURETHRAL RESECTION OF BLADDER TUMOR N/A 12/07/2018   Procedure: CYSTOSCOPY, TRANSURETHRAL RESECTION OF BLADDER TUMOR (TURBT) WITH INSTILLATION OF POST OPERATIVE CHEMOTHERAPY;  Surgeon: Kathie Rhodes, MD;  Location: [facility];  Service: Urology;  Laterality: N/A;  ONLY NEEDS 60 MIN  . TRANSURETHRAL RESECTION OF BLADDER TUMOR N/A 07/15/2019   Procedure: TRANSURETHRAL RESECTION OF BLADDER TUMOR (TURBT)/ CYSTOSCOPY;  Surgeon: Kathie Rhodes, MD;  Location: [facility];  Service: Urology;  Laterality: N/A;  . TRANSURETHRAL RESECTION OF BLADDER TUMOR N/A 01/03/2020   Procedure: TRANSURETHRAL RESECTION OF BLADDER TUMOR (TURBT);  Surgeon: Alexis Frock, MD;  Location: [facility];  Service: Urology;  Laterality: N/A;  1 HR       Family History  Problem Relation Age of Onset  . Prostate cancer Father   . Bladder Cancer Brother        bladder/dx 30 years ago    Social History   Tobacco Use  . Smoking status: Former Smoker    Packs/day: 1.00    Years: 10.00    Pack years: 10.00    Types: Cigarettes    Quit date: 04/11/1964    Years since quitting: 56.2  . Smokeless tobacco: Never Used  Vaping Use  . Vaping Use: Never used  Substance Use Topics  . Alcohol use: Yes    Comment: 1-2 shots vodka per day  . Drug use: No    Home Medications Prior to Admission medications   Medication Sig Start Date End Date Taking? Authorizing Provider  acetaminophen (TYLENOL) 325 MG tablet Take 650 mg by mouth every 6 (six) hours as needed for mild pain, moderate pain or headache.    [provider]  diazepam (VALIUM) 5 MG tablet Take 5 mg by mouth at bedtime as needed for anxiety or sedation.    [provider]  ibandronate (BONIVA) 150 MG tablet TAKE 1 TABLET BY MOUTH ONCE A MONTH    [provider]  omeprazole (PRILOSEC) 20  MG capsule Take 1 tablet by mouth daily.    [provider]  oxybutynin (DITROPAN) 5 MG tablet TAKE 1 TABLET BY MOUTH THREE TIMES DAILY AS NEEDED FOR BLADDER SPASMS 05/20/20   [provider]  oxyCODONE-acetaminophen (PERCOCET/ROXICET) 5-325 MG tablet Take 1 tablet by mouth every 4 (four) hours as needed for severe pain (unrelieved by APAP or Tramadol). 04/17/20   Pahwani, Michell Heinrich, MD  senna (SENOKOT) 8.6 MG TABS tablet TAKE 1 TABLET BY MOUTH TWICE DAILY AS NEEDED FOR CONSTIPATION 05/20/20   [provider]  simvastatin (ZOCOR) 10 MG tablet every evening.    [provider]  prochlorperazine (COMPAZINE) 10 MG tablet Take 1 tablet (10 mg total) by mouth every 6 (six) hours  as needed for nausea or vomiting. 02/06/20 06/09/20  Wyatt Portela, MD    Allergies    Patient has no known allergies.  Review of Systems   Review of Systems  Constitutional: Negative for chills and fever.  HENT: Negative for congestion and facial swelling.   Eyes: Negative for discharge and visual disturbance.  Respiratory: Negative for shortness of breath.   Cardiovascular: Negative for chest pain and palpitations.  Gastrointestinal: Negative for abdominal pain, diarrhea and vomiting.  Musculoskeletal: Negative for arthralgias and myalgias.  Skin: Positive for wound. Negative for color change and rash.  Neurological: Negative for tremors, syncope and headaches.  Psychiatric/Behavioral: Negative for confusion and dysphoric mood.    Physical Exam Updated Vital Signs BP 122/70   Pulse 79   Temp 98.2 F (36.8 C) (Oral)   Resp 14   Ht 5\' 10"  (1.778 m)   Wt 65.8 kg   SpO2 97%   BMI 20.81 kg/m   Physical Exam Vitals and nursing note reviewed.  Constitutional:      Appearance: He is well-developed and well-nourished.  HENT:     Head: Normocephalic and atraumatic.  Eyes:     Extraocular Movements: EOM normal.     Pupils: Pupils are equal, round, and reactive to light.  Neck:      Vascular: No JVD.  Cardiovascular:     Rate and Rhythm: Normal rate and regular rhythm.     Heart sounds: No murmur heard. No friction rub. No gallop.   Pulmonary:     Effort: No respiratory distress.     Breath sounds: No wheezing.  Abdominal:     General: There is no distension.     Tenderness: There is no guarding or rebound.  Musculoskeletal:        General: Normal range of motion.     Cervical back: Normal range of motion and neck supple.  Skin:    Coloration: Skin is not pale.     Findings: No rash.       Neurological:     Mental Status: He is alert and oriented to person, place, and time.  Psychiatric:        Mood and Affect: Mood and affect normal.        Behavior: Behavior normal.     ED Results / Procedures / Treatments   Labs (all labs ordered are listed, but only abnormal results are displayed) Labs Reviewed - No data to display  EKG None  Radiology No results found.  Procedures .Marland KitchenLaceration Repair  Date/Time: 06/30/2020 9:34 PM Performed by: Deno Etienne, DO Authorized by: Deno Etienne, DO   Consent:    Consent obtained:  Verbal   Consent given by:  Patient   Risks, benefits, and alternatives were discussed: yes     Risks discussed:  Infection, pain, poor cosmetic result and poor wound healing   Alternatives discussed:  No treatment, delayed treatment and observation Universal protocol:    Immediately prior to procedure, a time out was called: yes     Patient identity confirmed:  Verbally with patient Anesthesia:    Anesthesia method:  None Laceration details:    Location:  Shoulder/arm   Shoulder/arm location:  R elbow   Length (cm):  8.5 Exploration:    Hemostasis achieved with:  Direct pressure   Wound exploration: entire depth of wound visualized     Contaminated: no   Treatment:    Area cleansed with:  Saline   Amount of cleaning:  Standard  Irrigation solution:  Sterile saline   Irrigation volume:  500   Irrigation method:   Pressure wash   Visualized foreign bodies/material removed: no     Debridement:  None   Undermining:  None   Scar revision: no   Skin repair:    Repair method:  Steri-Strips   Number of Steri-Strips:  10 Approximation:    Approximation:  Close Repair type:    Repair type:  Simple Post-procedure details:    Dressing:  Bulky dressing and antibiotic ointment   Procedure completion:  Tolerated well, no immediate complications .Marland KitchenLaceration Repair  Date/Time: 06/30/2020 9:35 PM Performed by: Deno Etienne, DO Authorized by: Deno Etienne, DO   Consent:    Consent obtained:  Verbal   Consent given by:  Patient   Risks, benefits, and alternatives were discussed: yes     Risks discussed:  Infection, pain, poor cosmetic result and poor wound healing   Alternatives discussed:  Delayed treatment, observation and no treatment Universal protocol:    Procedure explained and questions answered to patient or proxy's satisfaction: yes     Immediately prior to procedure, a time out was called: yes     Patient identity confirmed:  Arm band Anesthesia:    Anesthesia method:  None Laceration details:    Location:  Hand   Hand location:  R hand, dorsum   Length (cm):  2.8 Exploration:    Limited defect created (wound extended): no     Hemostasis achieved with:  Direct pressure   Contaminated: no   Treatment:    Area cleansed with:  Saline   Amount of cleaning:  Standard   Irrigation solution:  Sterile saline   Irrigation volume:  500   Irrigation method:  Pressure wash   Visualized foreign bodies/material removed: no     Debridement:  None   Undermining:  None   Scar revision: no   Skin repair:    Repair method:  Steri-Strips   Number of Steri-Strips:  3 Approximation:    Approximation:  Close Repair type:    Repair type:  Simple Post-procedure details:    Dressing:  Bulky dressing   Procedure completion:  Tolerated well, no immediate complications .Marland KitchenLaceration Repair  Date/Time:  06/30/2020 9:35 PM Performed by: Deno Etienne, DO Authorized by: Deno Etienne, DO   Consent:    Consent obtained:  Verbal   Consent given by:  Patient   Risks, benefits, and alternatives were discussed: yes     Risks discussed:  Infection, pain, poor cosmetic result and poor wound healing Universal protocol:    Procedure explained and questions answered to patient or proxy's satisfaction: no     Patient identity confirmed:  Arm band Anesthesia:    Anesthesia method:  None Laceration details:    Location:  Shoulder/arm   Shoulder/arm location:  R lower arm   Length (cm):  5 Exploration:    Limited defect created (wound extended): no     Hemostasis achieved with:  Direct pressure   Contaminated: no   Treatment:    Area cleansed with:  Saline   Amount of cleaning:  Standard   Irrigation solution:  Sterile saline   Irrigation volume:  500   Irrigation method:  Pressure wash   Visualized foreign bodies/material removed: no     Debridement:  None   Undermining:  None   Scar revision: no   Skin repair:    Repair method:  Steri-Strips   Number of Steri-Strips:  6 Approximation:  Approximation:  Close Repair type:    Repair type:  Simple Post-procedure details:    Dressing:  Bulky dressing   Procedure completion:  Tolerated well, no immediate complications     Medications Ordered in ED Medications - No data to display  ED Course  I have reviewed the triage vital signs and the nursing notes.  Pertinent labs & imaging results that were available during my care of the patient were reviewed by me and considered in my medical decision making (see chart for details).    MDM Rules/Calculators/A&P                          87 yo M with a chief complaint of a nonsyncopal fall.  Complaining mostly of his skin tear on his right elbow.  He struck his head based on his exam.  I did discuss with him risk and benefits of obtaining CT imaging of the head.  He is declining at this time.  I  also discussed on suturing the wounds primarily based on their location to the elbow and the depth however he is electing for Steri-Strips.  9:33 PM:  I have discussed the diagnosis/risks/treatment options with the patient and believe the pt to be eligible for discharge home to follow-up with PCP. We also discussed returning to the ED immediately if new or worsening sx occur. We discussed the sx which are most concerning (e.g., sudden worsening pain, fever, inability to tolerate by mouth, redness, drainaged) that necessitate immediate return. Medications administered to the patient during their visit and any new prescriptions provided to the patient are listed below.  Medications given during this visit Medications - No data to display   The patient appears reasonably screen and/or stabilized for discharge and I doubt any other medical condition or other [facility] requiring further screening, evaluation, or treatment in the ED at this time prior to discharge.   Final Clinical Impression(s) / ED Diagnoses Final diagnoses:  Multiple skin tears  Closed head injury, initial encounter    Rx / DC Orders ED Discharge Orders    None       Deno Etienne, DO 06/30/20 2136

## 2020-09-18 ENCOUNTER — Telehealth: Payer: Self-pay | Admitting: Oncology

## 2020-09-18 NOTE — Telephone Encounter (Signed)
Scheduled appts per 5/27 sch msg. Called pt, no answer. Left msg with appts date and times.

## 2020-09-23 ENCOUNTER — Other Ambulatory Visit: Payer: Self-pay

## 2020-09-23 ENCOUNTER — Inpatient Hospital Stay: Attending: Oncology | Admitting: Oncology

## 2020-09-23 ENCOUNTER — Inpatient Hospital Stay

## 2020-09-23 ENCOUNTER — Encounter: Payer: Self-pay | Admitting: Oncology

## 2020-09-23 VITALS — BP 141/70 | HR 83 | Temp 96.2°F | Resp 18 | Ht 70.0 in | Wt 146.9 lb

## 2020-09-23 DIAGNOSIS — N179 Acute kidney failure, unspecified: Secondary | ICD-10-CM | POA: Insufficient documentation

## 2020-09-23 DIAGNOSIS — C672 Malignant neoplasm of lateral wall of bladder: Secondary | ICD-10-CM

## 2020-09-23 DIAGNOSIS — C679 Malignant neoplasm of bladder, unspecified: Secondary | ICD-10-CM | POA: Insufficient documentation

## 2020-09-23 NOTE — Progress Notes (Signed)
Hematology and Oncology Follow Up Visit  Douglas Wood [id number] 05/29/1931 87 y.o. 09/23/2020 1:04 PM Douglas Wood, MDShaw, Douglas May, MD   Principle Diagnosis: 87 year old man with locally advanced bladder with progressive disease noted December 2021.     Prior Therapy:  He S/P TURBT on April 14, 2017 under the care of Dr. Karsten Wood for a 5.5 cm tumor involving the right wall of the bladder.   Definitive therapy with radiation therapy and weekly carboplatin concluded in April 2019.  He is status post BCG treatment for 6 weeks completed in June of 2021.  Repeat cystoscopy in September 2021 continues to show residual tumor without muscle invasion.   Pembrolizumab 200 mg IV every 3 weeks started on February 13, 2020. He received 3 cycles of therapy with the last treatment given in December 2021.  Current therapy: Supportive care only under the care of hospice.  Interim History: Douglas Wood is here for a follow-up visit.  Since the last visit, he has received 3 cycles of Pembrolizumab and was reasonably tolerated.  He was hospitalized in December 2021 for sepsis and acute renal failure and that transition to hospice given his severe hydronephrosis and progressive bladder cancer.  Since that time, he continues to have improvement and recovery from that episode.  He has regained most activities of daily living at this time.  He any chest pain shortness of breath.  He denies any flank pain or hematuria.  He reports reasonable urine output with incontinence at nighttime.  He does not catheterize himself at this time.    Medications: Reviewed without changes. Current Outpatient Medications  Medication Sig Dispense Refill  . acetaminophen (TYLENOL) 325 MG tablet Take 650 mg by mouth every 6 (six) hours as needed for mild pain, moderate pain or headache.    . diazepam (VALIUM) 5 MG tablet Take 5 mg by mouth at bedtime as needed for anxiety or sedation.    . ibandronate (BONIVA) 150 MG  tablet TAKE 1 TABLET BY MOUTH ONCE A MONTH    . omeprazole (PRILOSEC) 20 MG capsule Take 1 tablet by mouth daily.    Marland Kitchen oxybutynin (DITROPAN) 5 MG tablet TAKE 1 TABLET BY MOUTH THREE TIMES DAILY AS NEEDED FOR BLADDER SPASMS    . oxyCODONE-acetaminophen (PERCOCET/ROXICET) 5-325 MG tablet Take 1 tablet by mouth every 4 (four) hours as needed for severe pain (unrelieved by APAP or Tramadol). 10 tablet 0  . senna (SENOKOT) 8.6 MG TABS tablet TAKE 1 TABLET BY MOUTH TWICE DAILY AS NEEDED FOR CONSTIPATION    . simvastatin (ZOCOR) 10 MG tablet every evening.     No current facility-administered medications for this visit.     Allergies:  No Known Allergies      Physical Exam:       Blood pressure (!) 141/70, pulse 83, temperature (!) 96.2 F (35.7 C), temperature source Tympanic, resp. rate 18, height 5\' 10"  (1.778 m), weight 146 lb 14.4 oz (66.6 kg), SpO2 100 %.     ECOG: 1    General appearance: Alert, awake without any distress. Head: Atraumatic without abnormalities Oropharynx: Without any thrush or ulcers. Eyes: No scleral icterus. Lymph nodes: No lymphadenopathy noted in the cervical, supraclavicular, or axillary nodes Heart:regular rate and rhythm, without any murmurs or gallops.   Lung: Clear to auscultation without any rhonchi, wheezes or dullness to percussion. Abdomin: Soft, nontender without any shifting dullness or ascites. Musculoskeletal: No clubbing or cyanosis. Neurological: No motor or sensory deficits. Skin: No rashes or  lesions.           Lab Results: Lab Results  Component Value Date   WBC 19.6 (H) 04/17/2020   HGB 9.9 (L) 04/17/2020   HCT 26.9 (L) 04/17/2020   MCV 79.6 (L) 04/17/2020   PLT 51 (L) 04/17/2020     Chemistry      Component Value Date/Time   NA 134 (L) 04/17/2020 0218   K 4.7 04/17/2020 0218   CL 87 (L) 04/17/2020 0218   CO2 25 04/17/2020 0218   BUN 158 (H) 04/17/2020 0218   CREATININE 7.58 (H) 04/17/2020 0218    CREATININE 1.96 (H) 03/27/2020 1118      Component Value Date/Time   CALCIUM 7.1 (L) 04/17/2020 0218   ALKPHOS 151 (H) 04/16/2020 0134   AST 27 04/16/2020 0134   AST 16 03/27/2020 1118   ALT 27 04/16/2020 0134   ALT 12 03/27/2020 1118   BILITOT 1.1 04/16/2020 0134   BILITOT 0.2 (L) 03/27/2020 1118         Impression and Plan:  87 year old man with:   1.    Bladder cancer diagnosed in 2018 with multiple recurrences most recently developed locally advanced disease in December 2021.    His disease status was updated at this time and treatment options were reviewed.  He developed disease progression on immunotherapy and not really a candidate for any additional systemic treatment or local treatment for that matter.  He developed acute renal failure and no further intervention from a urological standpoint.  At this time I recommend continued supportive management without any additional anticancer treatment.  Despite the improvement in his clinical condition and performance status, I feel any additional treatment given his age will not impact his quality of life and likely to make it worse.   2.  Immune mediated complications: No residual complications noted at this time.  Issues like hypophysitis, pneumonitis colitis were reviewed.  3.  Renal failure: Related to progressive bladder cancer.  He has adequate urine output at this time without any symptoms.  4.  Prognosis: Poor with limited life expectancy.  His performance status and quality of life remained reasonable.  5.  Follow-up: Happy to see him in the future as needed.   30  minutes were dedicated to this encounter.  The time was spent on reviewing his disease status, treatment options and future plan of care discussion.     Douglas Button, MD 6/1/20221:04 PM

## 2020-12-09 DIAGNOSIS — H52203 Unspecified astigmatism, bilateral: Secondary | ICD-10-CM | POA: Diagnosis not present

## 2020-12-09 DIAGNOSIS — Z961 Presence of intraocular lens: Secondary | ICD-10-CM | POA: Diagnosis not present

## 2020-12-21 DIAGNOSIS — R21 Rash and other nonspecific skin eruption: Secondary | ICD-10-CM | POA: Diagnosis not present

## 2020-12-21 DIAGNOSIS — Z85828 Personal history of other malignant neoplasm of skin: Secondary | ICD-10-CM | POA: Diagnosis not present

## 2020-12-21 DIAGNOSIS — L821 Other seborrheic keratosis: Secondary | ICD-10-CM | POA: Diagnosis not present

## 2020-12-21 DIAGNOSIS — C44722 Squamous cell carcinoma of skin of right lower limb, including hip: Secondary | ICD-10-CM | POA: Diagnosis not present

## 2020-12-21 DIAGNOSIS — Z85820 Personal history of malignant melanoma of skin: Secondary | ICD-10-CM | POA: Diagnosis not present

## 2020-12-21 DIAGNOSIS — D692 Other nonthrombocytopenic purpura: Secondary | ICD-10-CM | POA: Diagnosis not present

## 2020-12-21 DIAGNOSIS — L120 Bullous pemphigoid: Secondary | ICD-10-CM | POA: Diagnosis not present

## 2020-12-21 DIAGNOSIS — D485 Neoplasm of uncertain behavior of skin: Secondary | ICD-10-CM | POA: Diagnosis not present

## 2020-12-21 DIAGNOSIS — L570 Actinic keratosis: Secondary | ICD-10-CM | POA: Diagnosis not present

## 2021-02-17 ENCOUNTER — Ambulatory Visit: Payer: Medicare Other | Admitting: Internal Medicine

## 2021-02-23 DEATH — deceased

## 2021-02-24 ENCOUNTER — Ambulatory Visit: Payer: Medicare Other | Admitting: Internal Medicine

## 2021-03-22 IMAGING — CT CT ABD-PELV W/ CM
2 of 8 series · 10 of 46 positions shown, 16 images · IV contrast (OMNIPAQUE)
Comparison: 06/04/2019

CLINICAL DATA: Bladder cancer, diagnosed 1554, chemotherapy and XRT
complete.

EXAM:
CT CHEST, ABDOMEN, AND PELVIS WITH CONTRAST
TECHNIQUE: Multidetector CT imaging of the chest, abdomen and pelvis was
performed following the standard protocol during bolus
administration of intravenous contrast.
CONTRAST:  100mL OMNIPAQUE IOHEXOL 300 MG/ML  SOLN

[Series 2: axial post · axial · 0.71mm/px · z∈[-560,-80]mm · 7 of 129 slices shown, 12 images]
[im 17/129  soft-tissue]
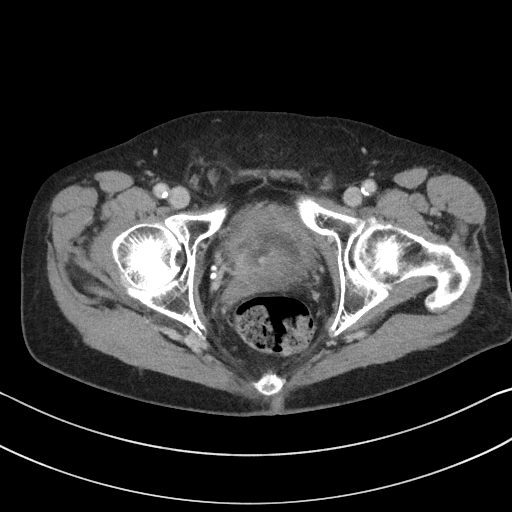
[im 17/129  bone]
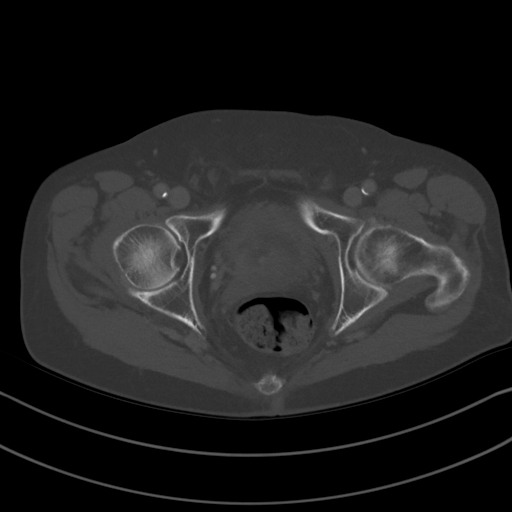
[im 33/129  soft-tissue]
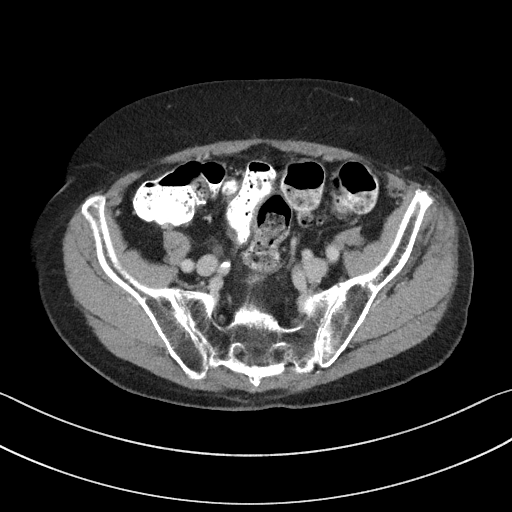
[im 49/129  soft-tissue]
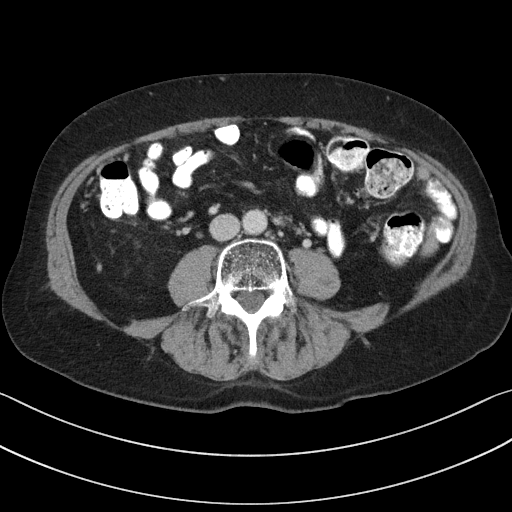
[im 65/129  soft-tissue]
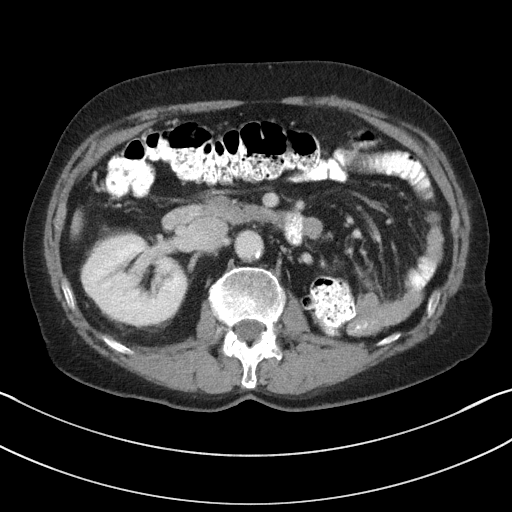
[im 65/129  lung]
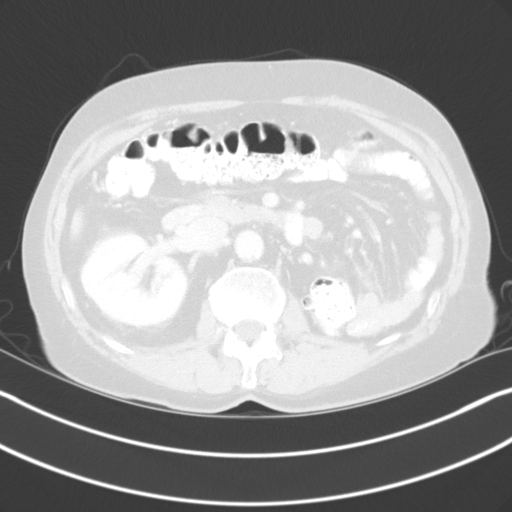
[im 81/129  soft-tissue]
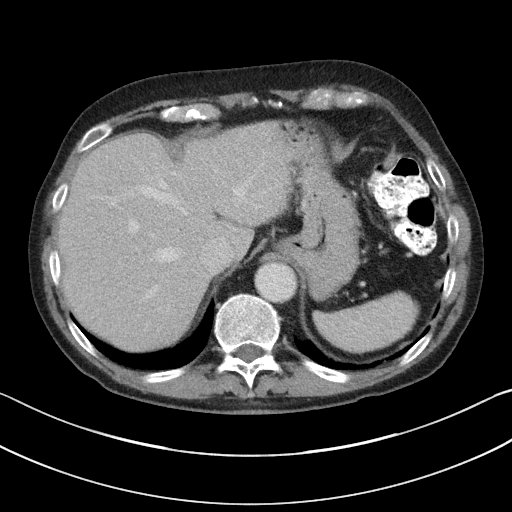
[im 81/129  lung]
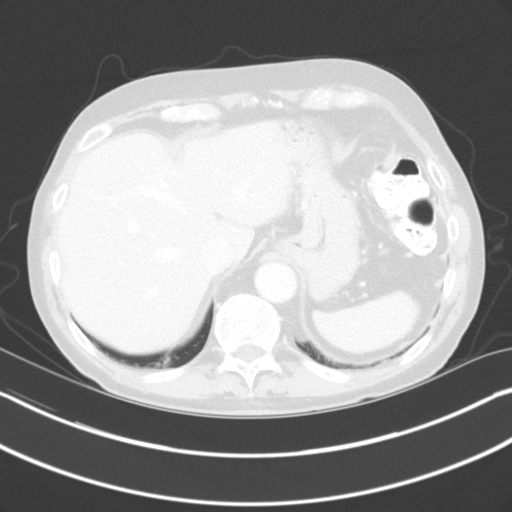
[im 97/129  soft-tissue]
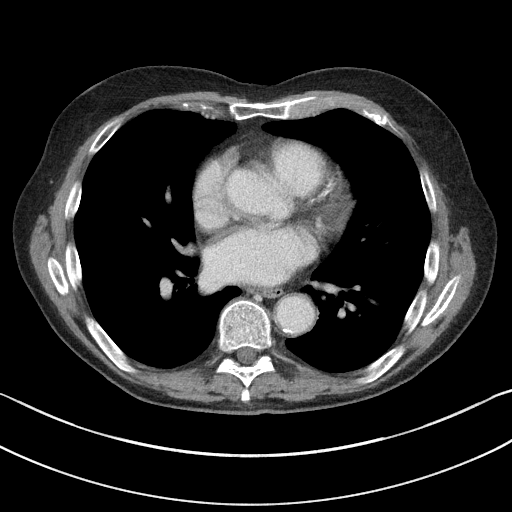
[im 97/129  lung]
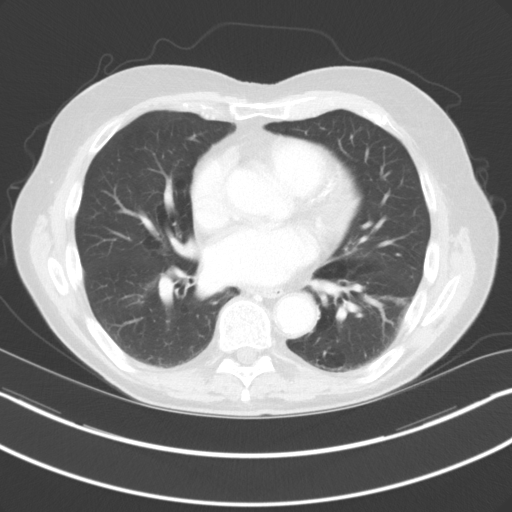
[im 113/129  soft-tissue]
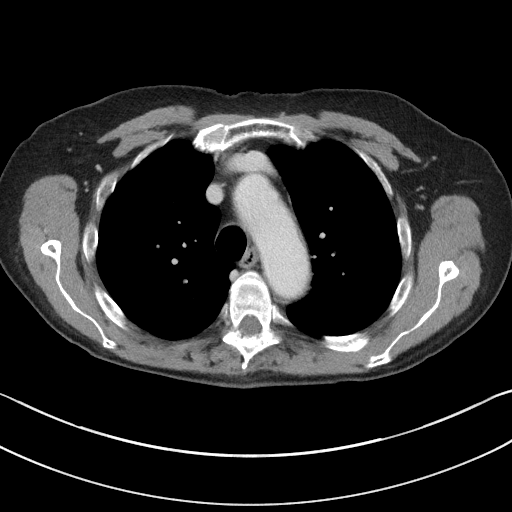
[im 113/129  lung]
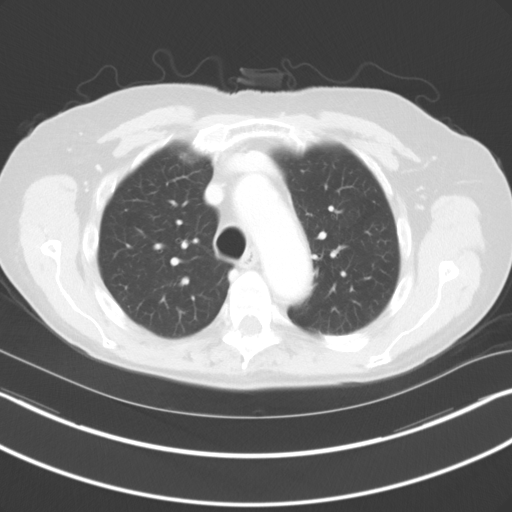

[Series 4: coronal post · coronal · 0.85mm/px · 3 of 93 slices shown, 4 images]
[im 24/93  soft-tissue]
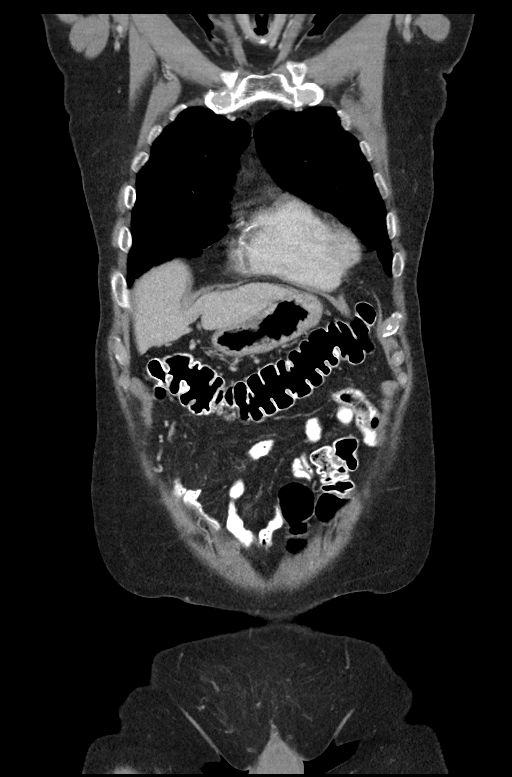
[im 47/93  soft-tissue]
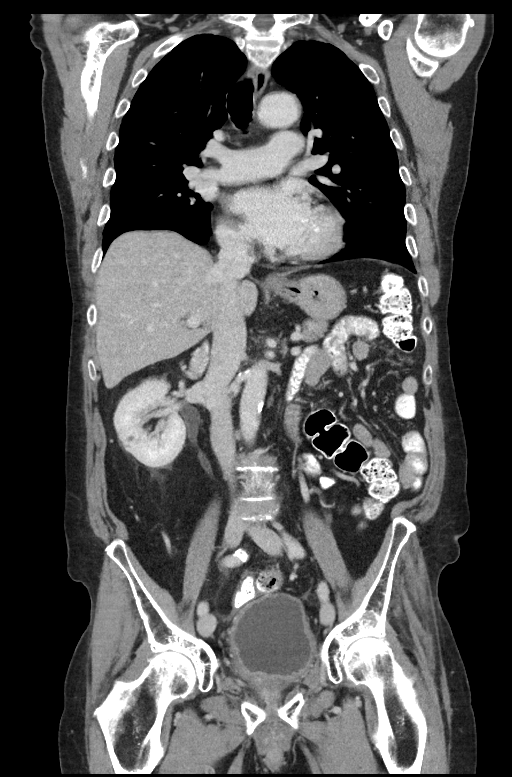
[im 47/93  bone]
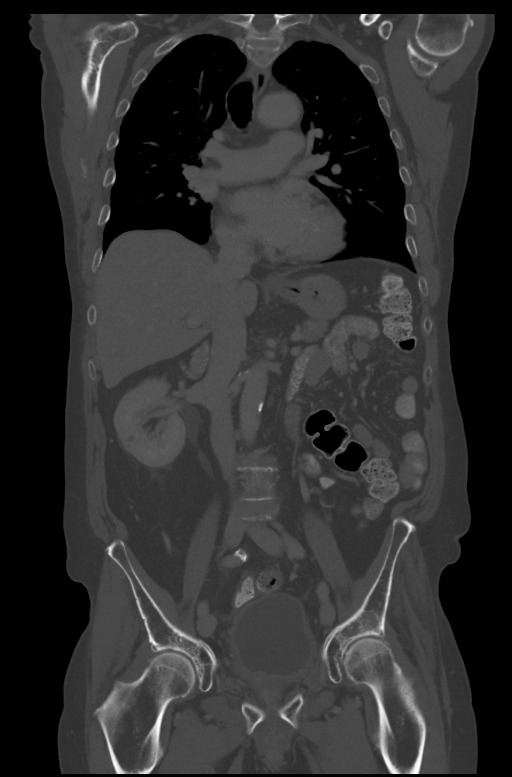
[im 70/93  soft-tissue]
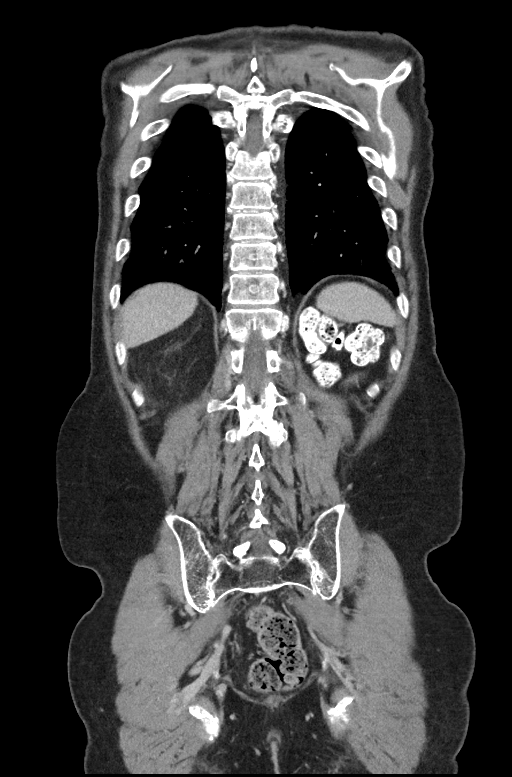

[10 of 46 positions shown; findings below may reference images not displayed]

FINDINGS: CT CHEST FINDINGS

Cardiovascular: Heart is top-normal in size. No pericardial
effusion.

Ectasia of the ascending thoracic aorta, measuring 3.9 cm.
Atherosclerotic calcifications of the aortic arch.

Three vessel coronary atherosclerosis.

Mediastinum/Nodes: No suspicious mediastinal lymphadenopathy.

Visualized thyroid is unremarkable.

Lungs/Pleura: No suspicious pulmonary nodules.

No focal consolidation.

Mild linear scarring/atelectasis in the left lower lobe.

No pleural effusion or pneumothorax.

Musculoskeletal: No focal osseous lesions.

CT ABDOMEN PELVIS FINDINGS

Hepatobiliary: 4 mm probable cyst in the posterior right hepatic
dome (series 2/image 43). Liver is otherwise within normal limits.

Gallbladder is unremarkable. No intrahepatic or extrahepatic ductal
dilatation.

Pancreas: Within normal limits.

Spleen: Within normal limits.

Adrenals/Urinary Tract: Adrenal glands are within normal limits.

Status post left nephroureterectomy. No abnormal soft tissue in the
surgical bed.

Scattered small right renal cysts measuring up to 9 mm (series
2/image 87). No enhancing renal lesions.

No right hydronephrosis. However, there is mild fullness of the
mid/distal right ureter.

Associated enhancing soft tissue in the right posterolateral bladder
at the ureteral orifice (series 2/image 111). Additional mild
mucosal enhancement along the left anterior bladder (series 2/image
109). These findings raise concern for residual primary bladder
tumor.

Stomach/Bowel: Stomach is within normal limits.

No evidence of bowel obstruction.

Appendix is not discretely visualized.

Vascular/Lymphatic: No evidence of abdominal aortic aneurysm.

Atherosclerotic calcifications of the abdominal aorta and branch
vessels.

No suspicious abdominopelvic lymphadenopathy.

Reproductive: Heterogeneous prostate.

Other: No abdominopelvic ascites.

Small fat/fluid containing right inguinal hernia (series 2/image
118).

Musculoskeletal: Mild degenerative changes at L2-3.
IMPRESSION: Mild irregular bladder wall thickening with mucosal hyperenhancement
along the left anterior bladder and right posterolateral bladder at
the ureteral orifice, raising concern for residual primary bladder
tumor. Cystoscopy is suggested.

Status post left nephroureterectomy.

No findings suspicious for metastatic disease.

Ectasia of the ascending thoracic aorta, measuring 3.9 cm.
